# Patient Record
Sex: Male | Born: 1945
Health system: Southern US, Community
[De-identification: ages and names within clinical notes are randomized; demographics above are authoritative.]

## PROBLEM LIST (undated history)

## (undated) DIAGNOSIS — G4733 Obstructive sleep apnea (adult) (pediatric): Secondary | ICD-10-CM

## (undated) DIAGNOSIS — I10 Essential (primary) hypertension: Secondary | ICD-10-CM

## (undated) DIAGNOSIS — C801 Malignant (primary) neoplasm, unspecified: Secondary | ICD-10-CM

## (undated) DIAGNOSIS — F329 Major depressive disorder, single episode, unspecified: Secondary | ICD-10-CM

## (undated) DIAGNOSIS — M1990 Unspecified osteoarthritis, unspecified site: Secondary | ICD-10-CM

## (undated) DIAGNOSIS — K5790 Diverticulosis of intestine, part unspecified, without perforation or abscess without bleeding: Secondary | ICD-10-CM

## (undated) DIAGNOSIS — E785 Hyperlipidemia, unspecified: Secondary | ICD-10-CM

## (undated) DIAGNOSIS — Z87442 Personal history of urinary calculi: Secondary | ICD-10-CM

## (undated) DIAGNOSIS — G43909 Migraine, unspecified, not intractable, without status migrainosus: Secondary | ICD-10-CM

## (undated) DIAGNOSIS — F419 Anxiety disorder, unspecified: Secondary | ICD-10-CM

## (undated) DIAGNOSIS — I499 Cardiac arrhythmia, unspecified: Secondary | ICD-10-CM

## (undated) DIAGNOSIS — F32A Depression, unspecified: Secondary | ICD-10-CM

## (undated) DIAGNOSIS — R7303 Prediabetes: Secondary | ICD-10-CM

## (undated) HISTORY — DX: Diverticulosis of intestine, part unspecified, without perforation or abscess without bleeding: K57.90

## (undated) HISTORY — DX: Hyperlipidemia, unspecified: E78.5

## (undated) HISTORY — DX: Depression, unspecified: F32.A

## (undated) HISTORY — DX: Essential (primary) hypertension: I10

## (undated) HISTORY — PX: TONSILLECTOMY: SUR1361

## (undated) HISTORY — DX: Major depressive disorder, single episode, unspecified: F32.9

## (undated) HISTORY — DX: Migraine, unspecified, not intractable, without status migrainosus: G43.909

## (undated) HISTORY — DX: Obstructive sleep apnea (adult) (pediatric): G47.33

## (undated) HISTORY — PX: GANGLION CYST EXCISION: SHX1691

---

## 2004-02-25 ENCOUNTER — Ambulatory Visit: Payer: Self-pay | Admitting: Psychology

## 2004-03-06 ENCOUNTER — Ambulatory Visit: Payer: Self-pay | Admitting: Psychology

## 2004-03-24 ENCOUNTER — Ambulatory Visit: Payer: Self-pay | Admitting: Psychology

## 2004-04-03 ENCOUNTER — Ambulatory Visit: Payer: Self-pay | Admitting: Psychology

## 2004-04-24 ENCOUNTER — Ambulatory Visit: Payer: Self-pay | Admitting: Psychology

## 2004-05-15 ENCOUNTER — Ambulatory Visit: Payer: Self-pay | Admitting: Psychology

## 2004-06-09 ENCOUNTER — Encounter (INDEPENDENT_AMBULATORY_CARE_PROVIDER_SITE_OTHER): Payer: Self-pay | Admitting: Specialist

## 2004-06-09 ENCOUNTER — Ambulatory Visit (HOSPITAL_COMMUNITY): Admission: RE | Admit: 2004-06-09 | Discharge: 2004-06-09 | Payer: Self-pay | Admitting: *Deleted

## 2004-06-12 ENCOUNTER — Ambulatory Visit: Payer: Self-pay | Admitting: Psychology

## 2004-07-10 ENCOUNTER — Ambulatory Visit: Payer: Self-pay | Admitting: Psychology

## 2004-07-24 ENCOUNTER — Ambulatory Visit: Payer: Self-pay | Admitting: Psychology

## 2004-08-07 ENCOUNTER — Ambulatory Visit: Payer: Self-pay | Admitting: Psychology

## 2004-08-21 ENCOUNTER — Ambulatory Visit: Payer: Self-pay | Admitting: Psychology

## 2004-09-05 ENCOUNTER — Ambulatory Visit: Payer: Self-pay | Admitting: Psychology

## 2004-09-18 ENCOUNTER — Ambulatory Visit: Payer: Self-pay | Admitting: Psychology

## 2004-10-16 ENCOUNTER — Ambulatory Visit: Payer: Self-pay | Admitting: Psychology

## 2004-10-30 ENCOUNTER — Ambulatory Visit: Payer: Self-pay | Admitting: Psychology

## 2004-11-13 ENCOUNTER — Ambulatory Visit: Payer: Self-pay | Admitting: Psychology

## 2004-11-27 ENCOUNTER — Ambulatory Visit: Payer: Self-pay | Admitting: Psychology

## 2004-12-11 ENCOUNTER — Ambulatory Visit: Payer: Self-pay | Admitting: Psychology

## 2005-01-08 ENCOUNTER — Ambulatory Visit: Payer: Self-pay | Admitting: Psychology

## 2005-01-22 ENCOUNTER — Ambulatory Visit: Payer: Self-pay | Admitting: Psychology

## 2005-02-05 ENCOUNTER — Ambulatory Visit: Payer: Self-pay | Admitting: Psychology

## 2005-02-19 ENCOUNTER — Ambulatory Visit: Payer: Self-pay | Admitting: Psychology

## 2005-03-13 ENCOUNTER — Ambulatory Visit: Payer: Self-pay | Admitting: Psychology

## 2005-04-24 ENCOUNTER — Ambulatory Visit: Payer: Self-pay | Admitting: Psychology

## 2005-05-22 ENCOUNTER — Ambulatory Visit: Payer: Self-pay | Admitting: Psychology

## 2005-05-30 ENCOUNTER — Ambulatory Visit: Payer: Self-pay | Admitting: Psychology

## 2005-06-19 ENCOUNTER — Ambulatory Visit: Payer: Self-pay | Admitting: Psychology

## 2005-07-03 ENCOUNTER — Ambulatory Visit: Payer: Self-pay | Admitting: Psychology

## 2005-07-24 ENCOUNTER — Ambulatory Visit: Payer: Self-pay | Admitting: Psychology

## 2005-08-27 ENCOUNTER — Inpatient Hospital Stay (HOSPITAL_COMMUNITY): Admission: EM | Admit: 2005-08-27 | Discharge: 2005-08-27 | Payer: Self-pay

## 2005-08-27 ENCOUNTER — Encounter: Payer: Self-pay | Admitting: Emergency Medicine

## 2006-08-01 ENCOUNTER — Encounter: Admission: RE | Admit: 2006-08-01 | Discharge: 2006-08-01 | Payer: Self-pay | Admitting: Family Medicine

## 2006-08-12 ENCOUNTER — Encounter: Admission: RE | Admit: 2006-08-12 | Discharge: 2006-08-12 | Payer: Self-pay | Admitting: Family Medicine

## 2010-04-19 ENCOUNTER — Ambulatory Visit (INDEPENDENT_AMBULATORY_CARE_PROVIDER_SITE_OTHER): Payer: Medicare Other | Admitting: Psychology

## 2010-04-19 DIAGNOSIS — F4322 Adjustment disorder with anxiety: Secondary | ICD-10-CM

## 2010-05-19 NOTE — Discharge Summary (Signed)
Wesley, [name]             ACCOUNT NO.:  [id number]   MEDICAL RECORD NO.:  [id number]          PATIENT TYPE:  INP   LOCATION:  3035                         FACILITY:  [facility]   PHYSICIAN:  Cherylynn Ridges, M.D.    DATE OF BIRTH:  12-19-1945   DATE OF ADMISSION:  08/27/2005  DATE OF DISCHARGE:  08/27/2005                                 DISCHARGE SUMMARY   DISCHARGE DIAGNOSES:  1. Status post fall down a flight of stairs.  2. Closed head injury concussion with amnesia for event and positive loss      of consciousness.  3. Cervical strain.   BRIEF HISTORY ON ADMISSION:  This is a 65 year old male who accidentally  fell down a flight of stairs at home.  He was found confused by the family.  He was brought to the [facility] ER and was complaining of some  neck pain.  He underwent scanning including head, C-spine, chest, abdomen  and pelvis all of which were negative but he appeared intermittently  confused and perseverative.  It was felt to be observed overnight.  He was  brought to [facility] Service in order for him to continue to be  observed.  He did well overnight and was alert, oriented and appropriate by  the following day.  He was continuing to complain of some neck pain and had  some known degenerative changes and was being followed by Dr. Newell Coral in  the  past for this.  He underwent flexion/extension views which did not show any  evidence for ligamentous instability and his cervical spine CT initially  again was negative for any evidence for fracture.  The patient's C. collar  was discontinued at this time and the patient was mobilized and tolerating a  regular diet.  At this time, he is discharged home.   MEDICATIONS ON DISCHARGE:  1. Vicodin 5/500 mg one to two p.o. q.4-6h. p.r.n. pain #50 no refill.  2. Robaxin 5000 mg one to two p.o. q.6h. p.r.n. muscle spasm #60 no      refill.   The patient is to resume driving in 1 week, no lifting for 1 week.   He is to  increase activity slowly.  He will follow up with trauma service on as-  needed basis, but can continue to follow up with Dr. Newell Coral concerning his  chronic degenerative C-spine issues.      Shawn Rayburn, P.A.      Cherylynn Ridges, M.D.  Electronically Signed    SR/MEDQ  D:  08/27/2005  T:  08/28/2005  Job:  [id number]

## 2010-05-19 NOTE — H&P (Signed)
NAMECOLLIER, BOHNET             ACCOUNT NO.:  [id number]   MEDICAL RECORD NO.:  [id number]          PATIENT TYPE:  EMS   LOCATION:  ED                           FACILITY:  [facility]   PHYSICIAN:  Lorne Skeens. Hoxworth, M.D.DATE OF BIRTH:  1945-08-25   DATE OF ADMISSION:  08/27/2005  DATE OF DISCHARGE:                                HISTORY & PHYSICAL   CHIEF COMPLAINT:  Fall, neck pain and confusion.   HISTORY OF PRESENT ILLNESS:  The patient is a 65 year old white male whose  family states they heard him fall down a short flight of steps at home.  He  was found lying on the ground, somewhat dazed, but awake.  Since that time,  he has been confused, disoriented and repeating questions.  They brought him  to the [facility] Emergency Room for evaluation.  He has been alert with  stable vital signs, confused as above.  His only complaint is some neck  discomfort.   PAST MEDICAL HISTORY:   SURGERY:  None.   MEDICAL:  He is followed for hypertension, elevated cholesterol and history  depression.   MEDICATIONS:  Norvasc, Zetia, Prozac, aspirin, dosages unknown.   ALLERGIES:  PENICILLIN.   SOCIAL HISTORY:  Married.  Currently unemployed.  No cigarettes.  Occasional  alcohol.   FAMILY HISTORY:  Noncontributory.   REVIEW OF SYSTEMS:  All negative, except as above.   PHYSICAL EXAM:  VITAL SIGNS:  Temperature is 97.9, pulse 71, respirations  20, blood pressure 139/93.  GENERAL:  Well-developed, well-nourished white male who is alert, but  confused.  Repeating questions.  SKIN:  Warm and dry.  HEENT/NECK:  Cervical collar is in place.  On examining the neck with a  collar off, there is some posterior neck tenderness and the collar is  replaced.  Pupils are equal, round and reactive to light.  EOMs intact.  TMs  clear without blood.  Oropharynx clear.  No cranial swelling, tenderness or  scalp lacerations or other evidence of craniofacial trauma.  CHEST:  Nontender.  No crepitus.   Breath sounds clear and equal.  CARDIAC:  Regular rhythm.  No murmurs.  Peripheral pulses intact.  No edema.  ABDOMEN:  Soft and nontender.  No masses.  MUSCULOSKELETAL:  Pelvis stable and nontender.  EXTREMITIES:  Full active and passive range of motion without pain.  No  swelling or tenderness.  NEUROLOGIC:  He is alert and responsive, oriented to person only, not place,  date or situation, repeating questions.  Strength and sensation intact in  extremities x4.   LABORATORY DATA AND X-RAY:  CBC, electrolytes and urinalysis all negative.   CT scans of the head, C-spine, chest and abdomen and pelvis all negative for  acute injury.   ASSESSMENT AND PLAN:  Fall with apparent closed head injury, with negative  CT scan.  The patient remains confused.  He also has some neck tenderness  with a normal CT of the cervical spine, rule out ligamentous injury.  The  patient will be admitted to the trauma service for observation and frequent  neurological checks.  We will leave  in cervical collar for now for repeat  examination.      Lorne Skeens. Hoxworth, M.D.  Electronically Signed     BTH/MEDQ  D:  08/27/2005  T:  08/27/2005  Job:  [id number]

## 2012-02-06 ENCOUNTER — Encounter (INDEPENDENT_AMBULATORY_CARE_PROVIDER_SITE_OTHER): Payer: Self-pay | Admitting: Surgery

## 2012-02-12 ENCOUNTER — Ambulatory Visit (INDEPENDENT_AMBULATORY_CARE_PROVIDER_SITE_OTHER): Payer: Self-pay | Admitting: Surgery

## 2012-02-13 ENCOUNTER — Ambulatory Visit (INDEPENDENT_AMBULATORY_CARE_PROVIDER_SITE_OTHER): Payer: Medicare PPO | Admitting: Surgery

## 2012-02-13 ENCOUNTER — Encounter (INDEPENDENT_AMBULATORY_CARE_PROVIDER_SITE_OTHER): Payer: Self-pay | Admitting: Surgery

## 2012-02-13 VITALS — BP 132/72 | HR 68 | Temp 97.4°F | Resp 18 | Ht 71.0 in | Wt 192.4 lb

## 2012-02-13 DIAGNOSIS — K645 Perianal venous thrombosis: Secondary | ICD-10-CM

## 2012-02-13 NOTE — Progress Notes (Signed)
General Surgery [facility]  Chief Complaint  Patient presents with  . New Evaluation    eval hemorrhoids - referral from Dr. Shaune Pollack, [place] Sprang at [place]    HISTORY: The patient is a 67 year old white male referred by his primary care physician with issues with hemorrhoids. He has had intermittent problems over the years. He has had a previous incision and evacuation of an external hemorrhoid by his primary care physician. Over the past 2-3 weeks he has noted pain and swelling itching and burning. He denies any bleeding. He presents today for evaluation.  Past Medical History  Diagnosis Date  . Depression   . Hyperlipidemia   . Hypertension   . Diverticulosis   . Migraine      Current Outpatient Prescriptions  Medication Sig Dispense Refill  . amLODipine (NORVASC) 10 MG tablet Take 10 mg by mouth daily.      Marland Kitchen aspirin 81 MG tablet Take 81 mg by mouth daily.      Marland Kitchen atorvastatin (LIPITOR) 10 MG tablet Take 10 mg by mouth daily.      Marland Kitchen buPROPion (WELLBUTRIN SR) 150 MG 12 hr tablet Take 150 mg by mouth 2 (two) times daily.      . fexofenadine (ALLEGRA) 180 MG tablet Take 180 mg by mouth daily.      . fluticasone (FLONASE) 50 MCG/ACT nasal spray Place 2 sprays into the nose daily.      . hydrochlorothiazide (MICROZIDE) 12.5 MG capsule Take 12.5 mg by mouth daily.      Marland Kitchen lidocaine-hydrocortisone (ANAMANTLE HC) 3-0.5 % CREA Place 1 Applicatorful rectally.      . valACYclovir (VALTREX) 1000 MG tablet Take 1,000 mg by mouth 2 (two) times daily.       No current facility-administered medications for this visit.     Allergies  Allergen Reactions  . Iohexol      Code: HIVES, Desc: PT DEVELOPED HIVES DURING IV ADMINISTRATION/   . Ivp Dye (Iodinated Diagnostic Agents) Hives  . Penicillins   . Percocet (Oxycodone-Acetaminophen) Itching  . Zetia (Ezetimibe) Nausea And Vomiting     Family History  Problem Relation Age of Onset  . Heart disease Mother   .  Pneumonia Father      History   Social History  . Marital Status: Married    Spouse Name: N/A    Number of Children: N/A  . Years of Education: N/A   Social History Main Topics  . Smoking status: Never Smoker   . Smokeless tobacco: Former Neurosurgeon    Types: Chew  . Alcohol Use: 0.6 oz/week    1 Glasses of wine per week     Comment: Daily.  . Drug Use: No  . Sexually Active: None   Other Topics Concern  . None   Social History Narrative  . None     REVIEW OF SYSTEMS - PERTINENT POSITIVES ONLY: No bleeding. Pain and swelling at anal verge. Burning and itching.  EXAM: Filed Vitals:   02/13/12 0859  BP: 132/72  Pulse: 68  Temp: 97.4 F (36.3 C)  Resp: 18    HEENT: normocephalic; pupils equal and reactive; sclerae clear; dentition good; mucous membranes moist NECK:  symmetric on extension; no palpable anterior or posterior cervical lymphadenopathy; no supraclavicular masses; no tenderness CHEST: clear to auscultation bilaterally without rales, rhonchi, or wheezes CARDIAC: regular rate and rhythm without significant murmur; peripheral pulses are full GU:   External examination shows a thrombosed external hemorrhoid in  the left mid anus. No ulceration. No prolapse. No sign of fistula or fissure. Moderate tenderness. EXT:  non-tender without edema; no deformity NEURO: no gross focal deficits; no sign of tremor  PROCEDURE: Under aseptic conditions, local anesthetic is injected. A 1 cm elliptical incision is made. A large volume of thrombus and ectatic vein is extracted. Hemostasis is achieved with direct pressure.  LABORATORY RESULTS: See [facility] ([facility]L-Epic) for most recent results   RADIOLOGY RESULTS: See [facility] (CHL-Epic) for most recent results   IMPRESSION: Thrombosed external hemorrhoid  PLAN: Usual instructions for local wound care given. Patient will return as needed.  Velora Heckler, MD, FACS General & Endocrine Surgery [facility]   Visit Diagnoses: 1. Hemorrhoids, external, thrombosed     Primary Care Physician: Hollice Espy, MD

## 2012-02-13 NOTE — Patient Instructions (Signed)
ANORECTAL PROCEDURES: 1.  Tub soaks 2-3 times daily in warm water (may add Epsom salts if desired) 2.  Stool softener for one month (store brand Miralax or Colace) 3.  Avoid toilet paper - use baby wipes or Tucks pads 4.  Increase water intake - 6-8 glasses daily 5.  Apply dry pad to area until drainage stops 

## 2013-12-23 ENCOUNTER — Ambulatory Visit
Admission: RE | Admit: 2013-12-23 | Discharge: 2013-12-23 | Disposition: A | Discharge status: Home / Self Care | Payer: Medicare PPO | Source: Ambulatory Visit | Attending: Family Medicine | Admitting: Family Medicine

## 2013-12-23 ENCOUNTER — Other Ambulatory Visit: Payer: Self-pay | Admitting: Family Medicine

## 2013-12-23 DIAGNOSIS — M545 Low back pain, unspecified: Secondary | ICD-10-CM

## 2014-01-05 ENCOUNTER — Ambulatory Visit: Payer: Medicare PPO | Attending: Family Medicine | Admitting: Physical Therapy

## 2014-01-05 DIAGNOSIS — S39002D Unspecified injury of muscle, fascia and tendon of lower back, subsequent encounter: Secondary | ICD-10-CM | POA: Insufficient documentation

## 2014-01-05 DIAGNOSIS — M545 Low back pain: Secondary | ICD-10-CM | POA: Insufficient documentation

## 2014-01-05 DIAGNOSIS — X58XXXD Exposure to other specified factors, subsequent encounter: Secondary | ICD-10-CM | POA: Insufficient documentation

## 2014-01-08 ENCOUNTER — Ambulatory Visit: Payer: Medicare PPO | Admitting: Physical Therapy

## 2014-01-08 DIAGNOSIS — S39002D Unspecified injury of muscle, fascia and tendon of lower back, subsequent encounter: Secondary | ICD-10-CM | POA: Diagnosis not present

## 2014-01-12 ENCOUNTER — Ambulatory Visit: Payer: Medicare PPO | Admitting: Physical Therapy

## 2014-01-12 DIAGNOSIS — S39002D Unspecified injury of muscle, fascia and tendon of lower back, subsequent encounter: Secondary | ICD-10-CM | POA: Diagnosis not present

## 2014-01-15 ENCOUNTER — Ambulatory Visit: Payer: Medicare PPO | Admitting: Physical Therapy

## 2014-01-15 DIAGNOSIS — S39002D Unspecified injury of muscle, fascia and tendon of lower back, subsequent encounter: Secondary | ICD-10-CM | POA: Diagnosis not present

## 2014-01-19 ENCOUNTER — Encounter: Payer: Medicare PPO | Admitting: Physical Therapy

## 2014-01-22 ENCOUNTER — Ambulatory Visit: Payer: Medicare PPO | Admitting: Physical Therapy

## 2014-01-26 ENCOUNTER — Ambulatory Visit: Payer: Medicare PPO | Admitting: Physical Therapy

## 2014-01-29 ENCOUNTER — Encounter: Payer: Medicare PPO | Admitting: Physical Therapy

## 2014-02-01 ENCOUNTER — Ambulatory Visit: Payer: Medicare PPO | Attending: Family Medicine | Admitting: Physical Therapy

## 2014-02-01 DIAGNOSIS — M545 Low back pain: Secondary | ICD-10-CM | POA: Insufficient documentation

## 2014-02-01 DIAGNOSIS — S39002D Unspecified injury of muscle, fascia and tendon of lower back, subsequent encounter: Secondary | ICD-10-CM | POA: Diagnosis not present

## 2014-02-01 DIAGNOSIS — X58XXXD Exposure to other specified factors, subsequent encounter: Secondary | ICD-10-CM | POA: Insufficient documentation

## 2014-02-02 ENCOUNTER — Encounter: Payer: Medicare PPO | Admitting: Physical Therapy

## 2014-02-05 ENCOUNTER — Ambulatory Visit: Payer: Self-pay | Admitting: Physical Therapy

## 2014-02-08 ENCOUNTER — Ambulatory Visit: Payer: Medicare PPO | Admitting: Physical Therapy

## 2014-06-28 DIAGNOSIS — F330 Major depressive disorder, recurrent, mild: Secondary | ICD-10-CM | POA: Diagnosis not present

## 2014-06-28 DIAGNOSIS — I10 Essential (primary) hypertension: Secondary | ICD-10-CM | POA: Diagnosis not present

## 2014-06-28 DIAGNOSIS — E780 Pure hypercholesterolemia: Secondary | ICD-10-CM | POA: Diagnosis not present

## 2014-08-14 DIAGNOSIS — S61002A Unspecified open wound of left thumb without damage to nail, initial encounter: Secondary | ICD-10-CM | POA: Diagnosis not present

## 2014-08-14 DIAGNOSIS — S61207A Unspecified open wound of left little finger without damage to nail, initial encounter: Secondary | ICD-10-CM | POA: Diagnosis not present

## 2014-11-24 DIAGNOSIS — R350 Frequency of micturition: Secondary | ICD-10-CM | POA: Diagnosis not present

## 2014-12-03 DIAGNOSIS — R3915 Urgency of urination: Secondary | ICD-10-CM | POA: Diagnosis not present

## 2014-12-03 DIAGNOSIS — N401 Enlarged prostate with lower urinary tract symptoms: Secondary | ICD-10-CM | POA: Diagnosis not present

## 2014-12-03 DIAGNOSIS — R972 Elevated prostate specific antigen [PSA]: Secondary | ICD-10-CM | POA: Diagnosis not present

## 2014-12-06 DIAGNOSIS — L570 Actinic keratosis: Secondary | ICD-10-CM | POA: Diagnosis not present

## 2014-12-06 DIAGNOSIS — L820 Inflamed seborrheic keratosis: Secondary | ICD-10-CM | POA: Diagnosis not present

## 2014-12-17 DIAGNOSIS — R972 Elevated prostate specific antigen [PSA]: Secondary | ICD-10-CM | POA: Diagnosis not present

## 2014-12-22 ENCOUNTER — Encounter (HOSPITAL_COMMUNITY): Payer: Self-pay | Admitting: Emergency Medicine

## 2014-12-22 ENCOUNTER — Emergency Department (HOSPITAL_COMMUNITY)
Admission: EM | Admit: 2014-12-22 | Discharge: 2014-12-22 | Disposition: A | Discharge status: Home / Self Care | Payer: Medicare PPO | Attending: Emergency Medicine | Admitting: Emergency Medicine

## 2014-12-22 DIAGNOSIS — Z79899 Other long term (current) drug therapy: Secondary | ICD-10-CM | POA: Insufficient documentation

## 2014-12-22 DIAGNOSIS — Z880 Allergy status to penicillin: Secondary | ICD-10-CM | POA: Insufficient documentation

## 2014-12-22 DIAGNOSIS — R1032 Left lower quadrant pain: Secondary | ICD-10-CM | POA: Diagnosis not present

## 2014-12-22 DIAGNOSIS — E785 Hyperlipidemia, unspecified: Secondary | ICD-10-CM | POA: Diagnosis not present

## 2014-12-22 DIAGNOSIS — R110 Nausea: Secondary | ICD-10-CM | POA: Diagnosis not present

## 2014-12-22 DIAGNOSIS — G43909 Migraine, unspecified, not intractable, without status migrainosus: Secondary | ICD-10-CM | POA: Diagnosis not present

## 2014-12-22 DIAGNOSIS — F329 Major depressive disorder, single episode, unspecified: Secondary | ICD-10-CM | POA: Diagnosis not present

## 2014-12-22 DIAGNOSIS — Z7982 Long term (current) use of aspirin: Secondary | ICD-10-CM | POA: Insufficient documentation

## 2014-12-22 DIAGNOSIS — I10 Essential (primary) hypertension: Secondary | ICD-10-CM | POA: Insufficient documentation

## 2014-12-22 LAB — LIPASE, BLOOD: Lipase: 28 U/L (ref 11–51)

## 2014-12-22 LAB — COMPREHENSIVE METABOLIC PANEL
ALBUMIN: 3.9 g/dL (ref 3.5–5.0)
ALK PHOS: 93 U/L (ref 38–126)
ALT: 29 U/L (ref 17–63)
AST: 28 U/L (ref 15–41)
Anion gap: 12 (ref 5–15)
BILIRUBIN TOTAL: 1 mg/dL (ref 0.3–1.2)
BUN: 10 mg/dL (ref 6–20)
CO2: 23 mmol/L (ref 22–32)
CREATININE: 1.07 mg/dL (ref 0.61–1.24)
Calcium: 9.1 mg/dL (ref 8.9–10.3)
Chloride: 103 mmol/L (ref 101–111)
GFR calc Af Amer: >60 mL/min (ref >60)
GFR calc non Af Amer: >60 mL/min (ref >60)
Glucose, Bld: 168 mg/dL — ABNORMAL HIGH (ref 65–99)
Potassium: 3.1 mmol/L — ABNORMAL LOW (ref 3.5–5.1)
Sodium: 138 mmol/L (ref 135–145)
TOTAL PROTEIN: 7 g/dL (ref 6.5–8.1)

## 2014-12-22 LAB — URINALYSIS, ROUTINE W REFLEX MICROSCOPIC
Bilirubin Urine: NEGATIVE
Glucose, UA: 100 mg/dL — AB
Hgb urine dipstick: NEGATIVE
KETONES UR: NEGATIVE mg/dL
LEUKOCYTES UA: NEGATIVE
NITRITE: NEGATIVE
PROTEIN: NEGATIVE mg/dL
Specific Gravity, Urine: 1.019 (ref 1.005–1.030)
pH: 6.5 (ref 5.0–8.0)

## 2014-12-22 LAB — CBC
HCT: 46.9 % (ref 39.0–52.0)
Hemoglobin: 16 g/dL (ref 13.0–17.0)
MCH: 30.8 pg (ref 26.0–34.0)
MCHC: 34.1 g/dL (ref 30.0–36.0)
MCV: 90.4 fL (ref 78.0–100.0)
PLATELETS: 217 10*3/uL (ref 150–400)
RBC: 5.19 MIL/uL (ref 4.22–5.81)
RDW: 13.8 % (ref 11.5–15.5)
WBC: 10.3 10*3/uL (ref 4.0–10.5)

## 2014-12-22 MED ORDER — CIPROFLOXACIN HCL 500 MG PO TABS: 500.0000 mg | ORAL_TABLET | Freq: Two times a day (BID) | ORAL | Status: DC

## 2014-12-22 MED ORDER — HYDROCODONE-ACETAMINOPHEN 5-325 MG PO TABS: 1.0000 | ORAL_TABLET | ORAL | Status: DC | PRN

## 2014-12-22 MED ORDER — CIPROFLOXACIN IN D5W 400 MG/200ML IV SOLN
400.0000 mg | Freq: Once | INTRAVENOUS | Status: AC
  Administered 2014-12-22: 400 mg via INTRAVENOUS
  Filled 2014-12-22: qty 200

## 2014-12-22 MED ORDER — METRONIDAZOLE IN NACL 5-0.79 MG/ML-% IV SOLN
500.0000 mg | Freq: Once | INTRAVENOUS | Status: AC
  Administered 2014-12-22: 500 mg via INTRAVENOUS
  Filled 2014-12-22: qty 100

## 2014-12-22 MED ORDER — FENTANYL CITRATE (PF) 100 MCG/2ML IJ SOLN
100.0000 ug | Freq: Once | INTRAMUSCULAR | Status: AC
  Administered 2014-12-22: 100 ug via INTRAVENOUS
  Filled 2014-12-22: qty 2

## 2014-12-22 MED ORDER — METRONIDAZOLE 500 MG PO TABS: 500.0000 mg | ORAL_TABLET | Freq: Two times a day (BID) | ORAL | Status: DC

## 2014-12-22 MED ORDER — ONDANSETRON HCL 4 MG/2ML IJ SOLN
4.0000 mg | Freq: Once | INTRAMUSCULAR | Status: AC
  Administered 2014-12-22: 4 mg via INTRAVENOUS
  Filled 2014-12-22: qty 2

## 2014-12-22 NOTE — ED Provider Notes (Signed)
CSN: [id number]     Arrival date & time 12/22/14  0402 History   First MD Initiated Contact with Patient 12/22/14 [phone]     Chief Complaint  Patient presents with  . Abdominal Pain     Patient is a 69 y.o. male presenting with abdominal pain. The history is provided by the spouse and the patient.  Abdominal Pain Pain location:  LLQ Pain quality: aching   Pain radiates to:  Does not radiate Pain severity:  Moderate Onset quality:  Gradual Duration:  3 hours Timing:  Constant Progression:  Unchanged Chronicity:  New Relieved by: rest. Worsened by:  Movement and palpation Associated symptoms: chills and nausea   Associated symptoms: no chest pain, no diarrhea, no dysuria, no fever, no hematochezia, no melena, no shortness of breath and no vomiting   Risk factors: has not had multiple surgeries   Patient reports onset of LLQ pain while sleeping He reports chills and mild nausea No fever is reported He has had this before and has been treated for presumed diverticulitis and started on antibiotics and improved No h/o abd surgery He reports he has had colonoscopy recently and was told he had diverticulosis   Past Medical History  Diagnosis Date  . Depression   . Hyperlipidemia   . Hypertension   . Diverticulosis   . Migraine    Past Surgical History  Procedure Laterality Date  . Ganglion cyst excision     Family History  Problem Relation Age of Onset  . Heart disease Mother   . Pneumonia Father    Social History  Substance Use Topics  . Smoking status: Never Smoker   . Smokeless tobacco: Former Systems developer    Types: Chew  . Alcohol Use: 0.6 oz/week    1 Glasses of wine per week     Comment: Daily.    Review of Systems  Constitutional: Positive for chills. Negative for fever.  Respiratory: Negative for shortness of breath.   Cardiovascular: Negative for chest pain.  Gastrointestinal: Positive for nausea and abdominal pain. Negative for vomiting, diarrhea, melena and  hematochezia.  Genitourinary: Negative for dysuria and difficulty urinating.  Musculoskeletal: Negative for back pain.  Neurological: Negative for weakness.  All other systems reviewed and are negative.     Allergies  Iohexol; Ivp dye; Penicillins; Percocet; and Zetia  Home Medications   Prior to Admission medications   Medication Sig Start Date End Date Taking? Authorizing Provider  amLODipine (NORVASC) 10 MG tablet Take 10 mg by mouth daily.   Yes Historical Provider, MD  aspirin 81 MG tablet Take 81 mg by mouth daily.   Yes Historical Provider, MD  atorvastatin (LIPITOR) 10 MG tablet Take 10 mg by mouth daily.   Yes Historical Provider, MD  buPROPion (WELLBUTRIN XL) 150 MG 24 hr tablet Take 150 mg by mouth daily.   Yes Historical Provider, MD  cyclobenzaprine (FLEXERIL) 10 MG tablet Take 10 mg by mouth 3 (three) times daily as needed for muscle spasms.   Yes Historical Provider, MD  fexofenadine (ALLEGRA) 180 MG tablet Take 180 mg by mouth daily as needed for allergies.    Yes Historical Provider, MD  fluticasone (FLONASE) 50 MCG/ACT nasal spray Place 2 sprays into the nose daily as needed for allergies.    Yes Historical Provider, MD  hydrochlorothiazide (MICROZIDE) 12.5 MG capsule Take 12.5 mg by mouth daily.   Yes Historical Provider, MD  SUMAtriptan (IMITREX) 6 MG/0.5ML SOLN injection Inject 6 mg into the skin every  2 (two) hours as needed for migraine or headache. May repeat in 2 hours if headache persists or recurs.   Yes Historical Provider, MD  tamsulosin (FLOMAX) 0.4 MG CAPS capsule Take 0.4 mg by mouth daily after breakfast.   Yes Historical Provider, MD  valACYclovir (VALTREX) 1000 MG tablet Take 1,000 mg by mouth every 12 (twelve) hours as needed (for flare up).    Yes Historical Provider, MD   BP 140/74 mmHg  Pulse 73  Temp(Src) 98.8 F (37.1 C) (Oral)  Resp 17  Ht 5\' 11"  (1.803 m)  Wt 83.915 kg  BMI 25.81 kg/m2  SpO2 90% Physical Exam CONSTITUTIONAL: Well  developed/well nourished HEAD: Normocephalic/atraumatic EYES: EOMI/PERRL ENMT: Mucous membranes moist NECK: supple no meningeal signs SPINE/BACK:entire spine nontender CV: S1/S2 noted, no murmurs/rubs/gallops noted LUNGS: Lungs are clear to auscultation bilaterally, no apparent distress ABDOMEN: soft, moderate LLQ tenderness but no rebound or guarding, bowel sounds noted GU:no cva tenderness NEURO: Pt is awake/alert/appropriate, moves all extremitiesx4.  No facial droop.   EXTREMITIES: pulses normal/equal, full ROM SKIN: warm, color normal PSYCH: no abnormalities of mood noted, alert and oriented to situation  ED Course  Procedures  Medications  ciprofloxacin (CIPRO) IVPB 400 mg (400 mg Intravenous New Bag/Given 12/22/14 0531)  metroNIDAZOLE (FLAGYL) IVPB 500 mg (500 mg Intravenous New Bag/Given 12/22/14 0531)  ondansetron (ZOFRAN) injection 4 mg (4 mg Intravenous Given 12/22/14 0512)  fentaNYL (SUBLIMAZE) injection 100 mcg (100 mcg Intravenous Given 12/22/14 0512)    Labs Review Labs Reviewed  COMPREHENSIVE METABOLIC PANEL - Abnormal; Notable for the following:    Potassium 3.1 (*)    Glucose, Bld 168 (*)    All other components within normal limits  URINALYSIS, ROUTINE W REFLEX MICROSCOPIC (NOT AT [facility]) - Abnormal; Notable for the following:    APPearance CLOUDY (*)    Glucose, UA 100 (*)    All other components within normal limits  LIPASE, BLOOD  CBC   I have personally reviewed and evaluated these  lab results as part of my medical decision-making.  6:13 AM Pt reports very similar to prior episodes Previous CT imaging has shows presence of diverticulitis and has had colonoscopy before that showed diverticulosis He would like to defer CT imaging and start antibiotics He is currently nearly pain free 7:06 AM Pt improved He ambulated without issues and no pain He reports pain nearly resolved No vomiting He would like to go home Pt is a good historian and feels this  is very similar to prior episodes After discussion, we agree to defer CT imaging, start cipro/flagyll and PCP f/u We discussed strict ER return precautions BP 141/82 mmHg  Pulse 67  Temp(Src) 98.3 F (36.8 C) (Oral)  Resp 17  Ht 5\' 11"  (1.803 m)  Wt 83.915 kg  BMI 25.81 kg/m2  SpO2 96% I doubt AAA or other acute abdominal emergency at this time   MDM   Final diagnoses:  Left lower quadrant pain    Nursing notes including past medical history and social history reviewed and considered in documentation Labs/vital reviewed myself and considered during evaluation Previous records reviewed and considered     Ripley Fraise, MD 12/22/14 [phone]

## 2014-12-22 NOTE — ED Notes (Signed)
Pt states lower left quadrant abdominal pain. Pt states painful to touch and with movement. Pt states past hx of diverticulitis. Pt AO x 4, denies nausea, vomiting and diarrhea.

## 2014-12-22 NOTE — ED Notes (Signed)
assit walking pt.he walked very well.

## 2014-12-22 NOTE — ED Notes (Signed)
Dr. Wickline at the bedside.  

## 2016-05-12 IMAGING — CR DG LUMBAR SPINE COMPLETE 4+V
5 series · 5 of 5 positions shown · non-contrast
Comparison: None.

CLINICAL DATA: Low back pain.

EXAM:
LUMBAR SPINE - COMPLETE 4+ VIEW

[view not recorded (1 of 5)]
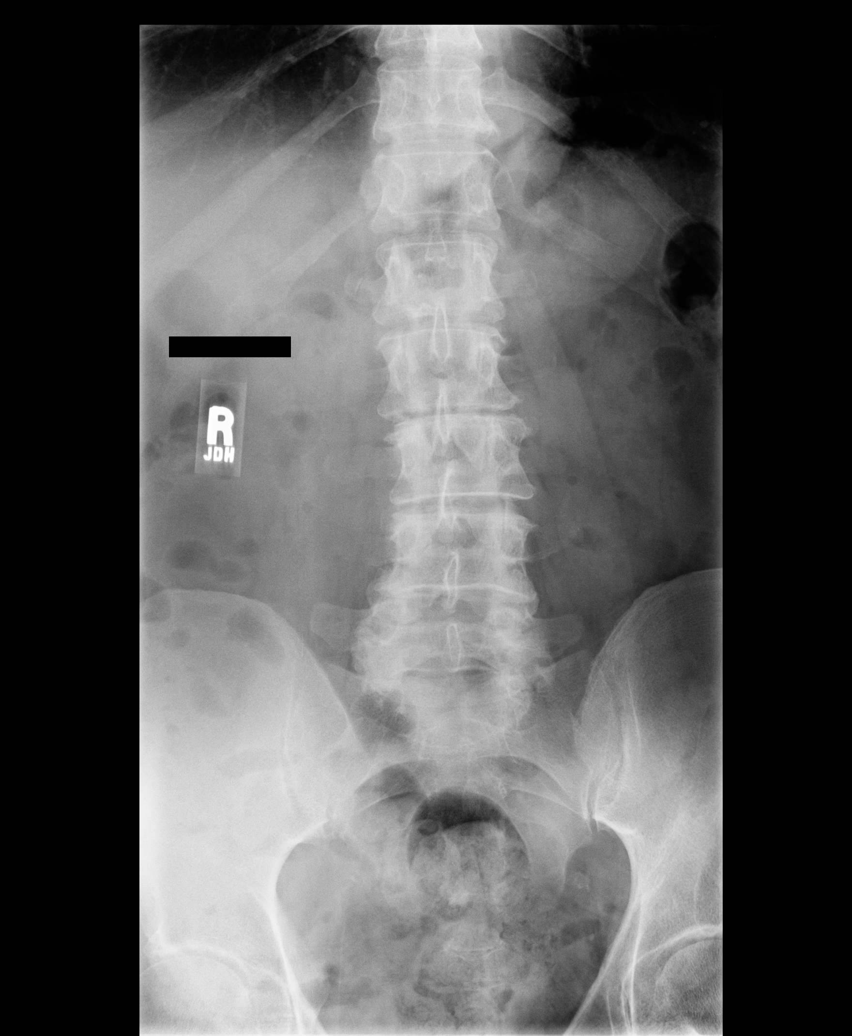

[view not recorded (2 of 5)]
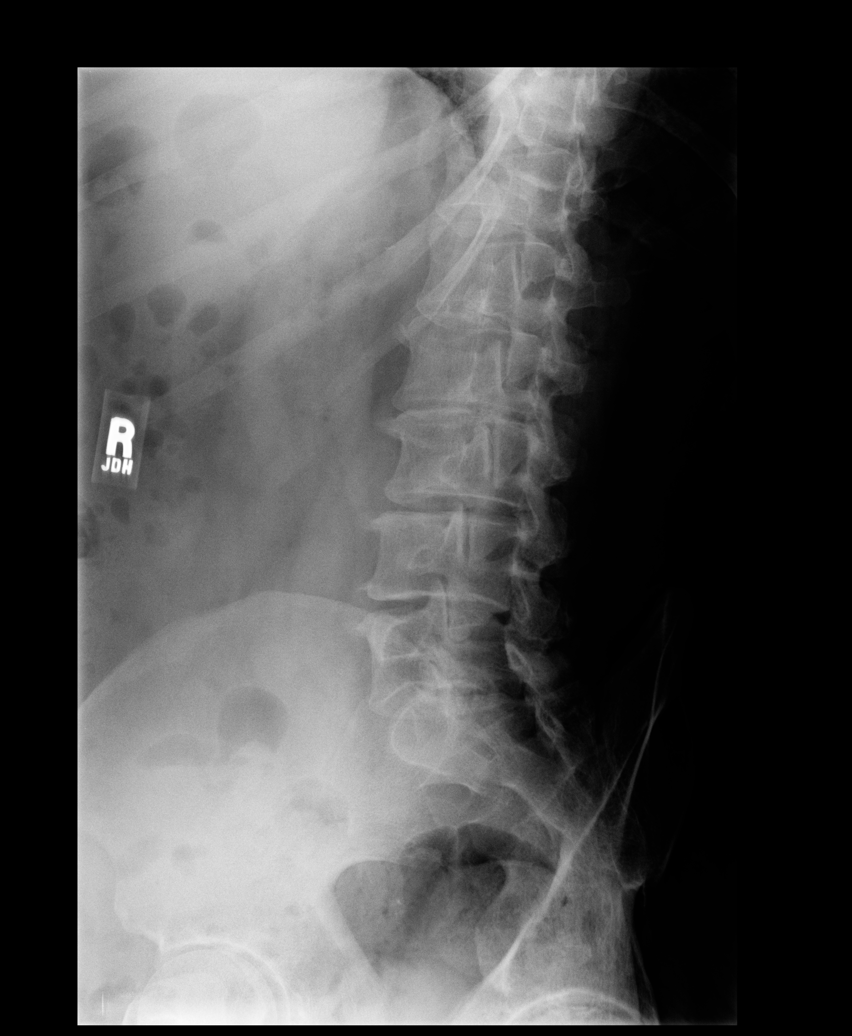

[view not recorded (3 of 5)]
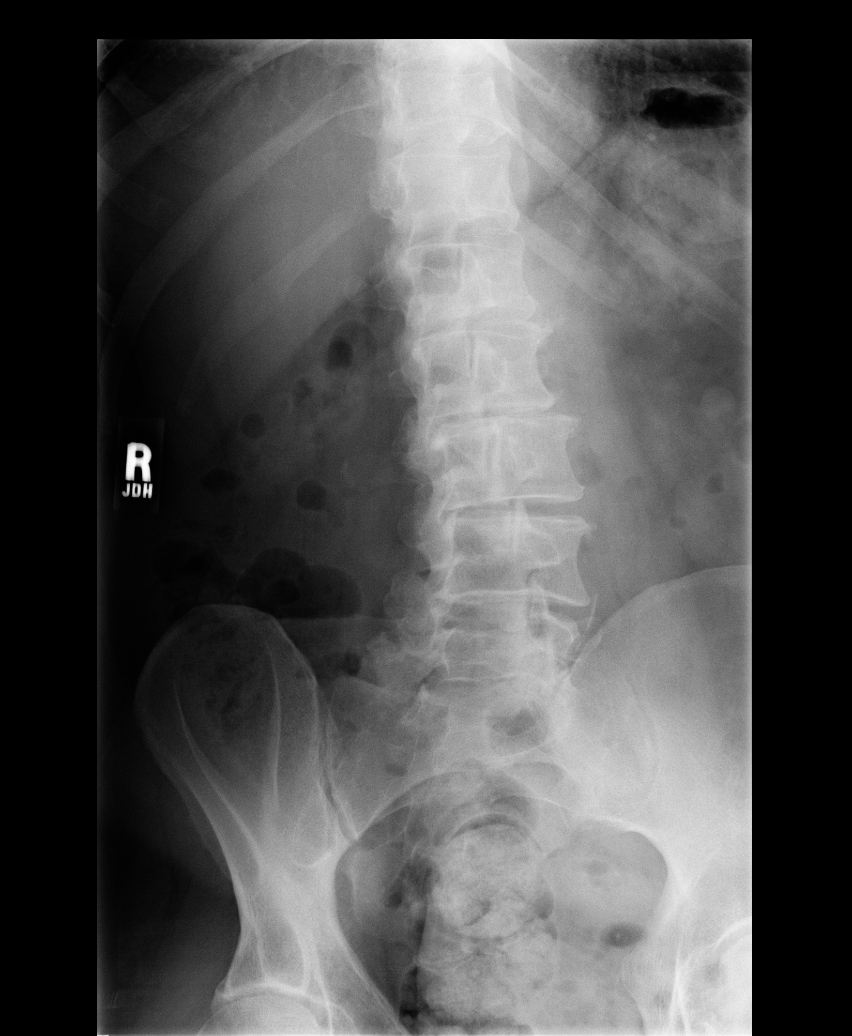

[view not recorded (4 of 5)]
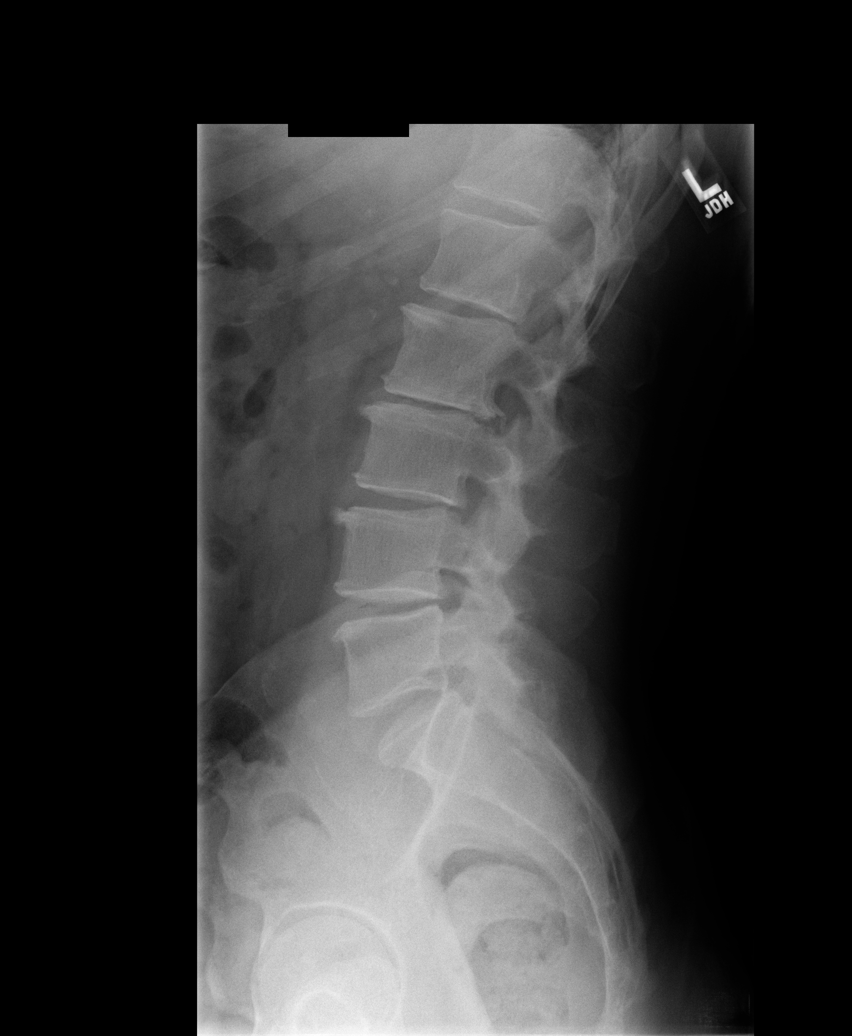

[view not recorded (5 of 5)]
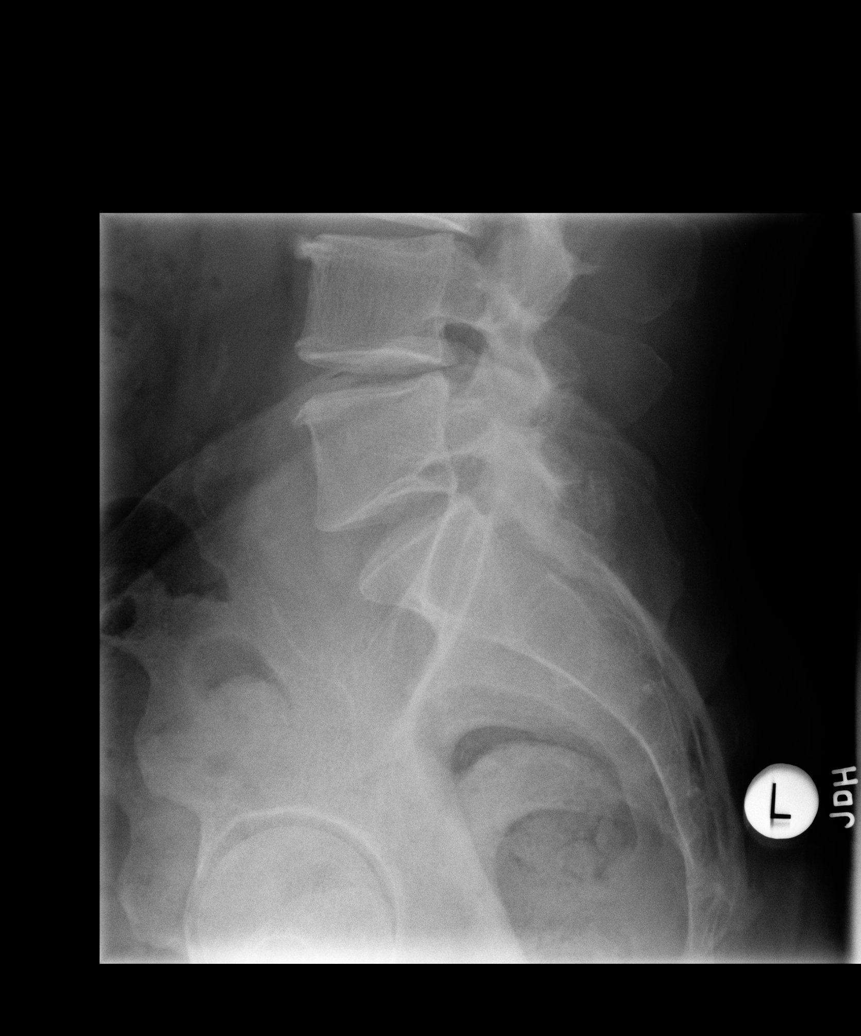

[5 of 5 positions shown; findings below may reference images not displayed]

FINDINGS: Diffuse degenerative change. Multilevel disc degeneration with
endplate osteophyte formation. No evidence of fracture or
dislocation. Normal alignment.
IMPRESSION: Diffuse degenerative change.  No acute abnormality.

## 2017-05-07 DIAGNOSIS — K219 Gastro-esophageal reflux disease without esophagitis: Secondary | ICD-10-CM | POA: Insufficient documentation

## 2017-05-07 DIAGNOSIS — F458 Other somatoform disorders: Secondary | ICD-10-CM | POA: Insufficient documentation

## 2017-05-07 DIAGNOSIS — H9113 Presbycusis, bilateral: Secondary | ICD-10-CM | POA: Insufficient documentation

## 2018-09-17 ENCOUNTER — Ambulatory Visit (INDEPENDENT_AMBULATORY_CARE_PROVIDER_SITE_OTHER): Payer: Medicare Other

## 2018-09-17 ENCOUNTER — Other Ambulatory Visit: Payer: Self-pay

## 2018-09-17 ENCOUNTER — Other Ambulatory Visit: Payer: Self-pay | Admitting: Podiatry

## 2018-09-17 ENCOUNTER — Encounter: Payer: Self-pay | Admitting: Podiatry

## 2018-09-17 ENCOUNTER — Ambulatory Visit: Payer: Medicare Other | Admitting: Podiatry

## 2018-09-17 DIAGNOSIS — M722 Plantar fascial fibromatosis: Secondary | ICD-10-CM | POA: Diagnosis not present

## 2018-09-17 DIAGNOSIS — M79672 Pain in left foot: Secondary | ICD-10-CM

## 2018-09-17 NOTE — Patient Instructions (Signed)

## 2018-09-17 NOTE — Progress Notes (Signed)
   Subjective:    Patient ID: Wesley [name], male    DOB: 03-26-1945, 73 y.o.   MRN: [id number]  HPI    Review of Systems  All other systems reviewed and are negative.      Objective:   Physical Exam        Assessment & Plan:

## 2018-09-23 NOTE — Progress Notes (Signed)
Subjective:   Patient ID: Wesley [name], male   DOB: 73 y.o.   MRN: [id number]   HPI Patient presents with painful left heel that at times a 10 for 10 and is becoming more bothersome.  States is been hurting for at least 2 months but probably longer and patient does not smoke likes to be active   Review of Systems  All other systems reviewed and are negative.       Objective:  Physical Exam Vitals signs and nursing note reviewed.  Constitutional:      Appearance: He is well-developed.  Pulmonary:     Effort: Pulmonary effort is normal.  Musculoskeletal: Normal range of motion.  Skin:    General: Skin is warm.  Neurological:     Mental Status: He is alert.     Neurovascular status intact muscle strength found to be adequate range of motion within normal limits with exquisite discomfort plantar aspect left heel at the insertional point tendon into the calcaneus with inflammation fluid around the medial band.  Patient is found to have good digital perfusion well oriented x3 with moderate depression of the arch and equinus at     Assessment:  2 plantar fasciitis left with inflammation fluid around the medial band     Plan:  H&P reviewed condition and did sterile prep and injected the fascia left 3 mg Kenalog 5 g Xylocaine applied fascial brace gave instructions on physical therapy shoe gear modifications  X-rays indicate spur formation no indication stress fracture arthritis

## 2018-10-01 ENCOUNTER — Other Ambulatory Visit: Payer: Self-pay

## 2018-10-01 ENCOUNTER — Encounter: Payer: Self-pay | Admitting: Podiatry

## 2018-10-01 ENCOUNTER — Ambulatory Visit (INDEPENDENT_AMBULATORY_CARE_PROVIDER_SITE_OTHER): Payer: Medicare Other | Admitting: Podiatry

## 2018-10-01 DIAGNOSIS — M722 Plantar fascial fibromatosis: Secondary | ICD-10-CM

## 2018-10-01 NOTE — Patient Instructions (Signed)

## 2018-10-01 NOTE — Progress Notes (Signed)
Subjective:   Patient ID: Wesley [name], male   DOB: 73 y.o.   MRN: [id number]   HPI Patient states my heel feels a little bit better but is still very sore and it is worse when I get up in the morning and after periods of sitting   ROS      Objective:  Physical Exam  Neurovascular status intact with exquisite acute inflammation still noted plantar aspect left heel at the insertional point of the tendon into the calcaneus     Assessment:  Acute fasciitis left still present worse after periods of sitting and sleeping     Plan:  I recommended aggressive stretching and I did dispense a night splint with instructions on usage along with ice therapy.  I did sterile prep and injected the fascia 3 mg Kenalog 5 mg Xylocaine and applied sterile dressing

## 2018-10-29 ENCOUNTER — Ambulatory Visit: Payer: Medicare Other | Admitting: Podiatry

## 2018-10-30 ENCOUNTER — Encounter: Payer: Self-pay | Admitting: Podiatry

## 2018-10-30 ENCOUNTER — Ambulatory Visit (INDEPENDENT_AMBULATORY_CARE_PROVIDER_SITE_OTHER): Payer: Medicare Other | Admitting: Podiatry

## 2018-10-30 ENCOUNTER — Other Ambulatory Visit: Payer: Self-pay

## 2018-10-30 DIAGNOSIS — M722 Plantar fascial fibromatosis: Secondary | ICD-10-CM | POA: Diagnosis not present

## 2018-10-31 NOTE — Progress Notes (Signed)
Subjective:   Patient ID: Wesley [name], male   DOB: 73 y.o.   MRN: [id number]   HPI Patient presents stating he still having a lot of pain in the left heel and states he had relief for just a short period of time followed by reoccurrence and it is worse when he walks and tries to weight-bear   ROS      Objective:  Physical Exam  Acute plantar fasciitis status intact with patient found to have continued exquisite discomfort plantar aspect left heel     Assessment:  Acute plantar fasciitis left which so far does not respond to conservative treatment     Plan:  H&P reviewed condition and at this point we will get a try immobilization with a walking boot.  I also did do sterile prep and reinjected the fascia 3 mg Kenalog 5 g Xylocaine and advised him on gradual reduction of the boot after 2 to 3 weeks if symptoms are improving.  Reappoint 4 weeks or earlier if needed

## 2018-11-21 ENCOUNTER — Other Ambulatory Visit: Payer: Self-pay

## 2018-11-21 DIAGNOSIS — Z20822 Contact with and (suspected) exposure to covid-19: Secondary | ICD-10-CM

## 2018-11-24 LAB — NOVEL CORONAVIRUS, NAA: SARS-CoV-2, NAA: NOT DETECTED

## 2018-12-03 ENCOUNTER — Ambulatory Visit: Payer: Medicare Other | Admitting: Podiatry

## 2018-12-03 ENCOUNTER — Other Ambulatory Visit: Payer: Self-pay

## 2018-12-03 ENCOUNTER — Encounter: Payer: Self-pay | Admitting: Podiatry

## 2018-12-03 DIAGNOSIS — M722 Plantar fascial fibromatosis: Secondary | ICD-10-CM

## 2018-12-03 DIAGNOSIS — M79672 Pain in left foot: Secondary | ICD-10-CM

## 2018-12-03 NOTE — Progress Notes (Signed)
Subjective:   Patient ID: Wesley [name], male   DOB: 73 y.o.   MRN: [id number]   HPI Patient presents stating I am improving but I still have periods where he gets quite intensely sore.  States the boot seems to be making a difference but I know I need more support   ROS      Objective:  Physical Exam  Neurovascular status intact with patient noted to have diminished discomfort in the plantar fascial left with discomfort still upon deep palpation to the tissue with improvement upon superficial palpation with flatfoot deformity noted     Assessment:  Plantar fasciitis left improving still present     Plan:  Advised this patient on the importance of physical therapy stretching exercises and I did cast for functional orthotics with heel lift to try to take pressure off the plantar heel.  Patient will be seen back when ready and was given all instructions and we will check back after that and ultimately may require other procedure depending on response.  Today the orthotic casting were done and he will see ped orthotist for dispensing

## 2018-12-29 ENCOUNTER — Other Ambulatory Visit: Payer: Medicare Other | Admitting: Orthotics

## 2019-01-12 ENCOUNTER — Other Ambulatory Visit: Payer: Self-pay | Admitting: Urology

## 2019-01-12 DIAGNOSIS — R972 Elevated prostate specific antigen [PSA]: Secondary | ICD-10-CM

## 2019-01-13 ENCOUNTER — Ambulatory Visit: Payer: Medicare PPO | Admitting: Orthotics

## 2019-01-13 ENCOUNTER — Other Ambulatory Visit: Payer: Self-pay

## 2019-01-13 DIAGNOSIS — M722 Plantar fascial fibromatosis: Secondary | ICD-10-CM

## 2019-01-13 NOTE — Progress Notes (Signed)
Patient came in today to pick up custom made foot orthotics.  The goals were accomplished and the patient reported no dissatisfaction with said orthotics.  Patient was advised of breakin period and how to report any issues. 

## 2019-01-20 ENCOUNTER — Ambulatory Visit: Payer: Medicare PPO | Attending: Internal Medicine

## 2019-01-20 DIAGNOSIS — Z23 Encounter for immunization: Secondary | ICD-10-CM | POA: Insufficient documentation

## 2019-01-20 NOTE — Progress Notes (Signed)
   Covid-19 Vaccination Clinic  Name:  Wesley [name]    MRN: [id number] DOB: 06/24/1945  01/20/2019  Wesley [name] was observed post Covid-19 immunization for 15 minutes without incidence. He was provided with Vaccine Information Sheet and instruction to access the V-Safe system.   Wesley [name] was instructed to call 911 with any severe reactions post vaccine: Marland Kitchen Difficulty breathing  . Swelling of your face and throat  . A fast heartbeat  . A bad rash all over your body  . Dizziness and weakness    Immunizations Administered    Name Date Dose VIS Date Route   Pfizer COVID-19 Vaccine 01/20/2019  2:04 PM 0.3 mL 12/12/2018 Intramuscular   Manufacturer: [place]   Lot: [id number]   [place]: [id number]

## 2019-01-26 ENCOUNTER — Ambulatory Visit: Payer: Medicare Other | Admitting: Podiatry

## 2019-01-26 ENCOUNTER — Encounter: Payer: Self-pay | Admitting: Podiatry

## 2019-01-26 ENCOUNTER — Other Ambulatory Visit: Payer: Self-pay

## 2019-01-26 VITALS — Temp 97.1°F

## 2019-01-26 DIAGNOSIS — M722 Plantar fascial fibromatosis: Secondary | ICD-10-CM

## 2019-01-26 NOTE — Progress Notes (Signed)
Subjective:   Patient ID: Wesley [name], male   DOB: 74 y.o.   MRN: [id number]   HPI Patient presents stating that the left foot is improving and he has numerous questions about the future   ROS      Objective:  Physical Exam  Neurovascular status intact with patient's motion normal and good digital perfusion.  Patient is found to have diminishment of discomfort plantar left heel at the insertional point of the tendon into the calcaneus     Assessment:  Improving with acute fasciitis-like symptoms left     Plan:  H&P reviewed condition and at this point we discussed treatment options conservative and surgical with the possibility for shockwave.  Patient is discharged will be seen back as needed and understands different options for himself

## 2019-02-05 ENCOUNTER — Ambulatory Visit
Admission: RE | Admit: 2019-02-05 | Discharge: 2019-02-05 | Disposition: A | Discharge status: Home / Self Care | Payer: Medicare PPO | Source: Ambulatory Visit | Attending: Urology | Admitting: Urology

## 2019-02-05 ENCOUNTER — Other Ambulatory Visit: Payer: Self-pay

## 2019-02-05 DIAGNOSIS — R972 Elevated prostate specific antigen [PSA]: Secondary | ICD-10-CM

## 2019-02-05 MED ORDER — GADOBENATE DIMEGLUMINE 529 MG/ML IV SOLN
17.0000 mL | Freq: Once | INTRAVENOUS | Status: AC | PRN
  Administered 2019-02-05: 16:00:00 17 mL via INTRAVENOUS

## 2019-02-07 ENCOUNTER — Encounter: Payer: Self-pay | Admitting: Podiatry

## 2019-02-10 ENCOUNTER — Ambulatory Visit: Payer: Medicare PPO | Attending: Internal Medicine

## 2019-02-10 ENCOUNTER — Ambulatory Visit: Payer: Medicare PPO

## 2019-02-10 DIAGNOSIS — Z23 Encounter for immunization: Secondary | ICD-10-CM

## 2019-02-10 NOTE — Progress Notes (Signed)
   Covid-19 Vaccination Clinic  Name:  Wesley [name]    MRN: [id number] DOB: 04-01-1945  02/10/2019  [name] was observed post Covid-19 immunization for 30 minutes based on pre-vaccination screening without incidence. He was provided with Vaccine Information Sheet and instruction to access the V-Safe system.   [name] was instructed to call 911 with any severe reactions post vaccine: Marland Kitchen Difficulty breathing  . Swelling of your face and throat  . A fast heartbeat  . A bad rash all over your body  . Dizziness and weakness    Immunizations Administered    Name Date Dose VIS Date Route   Pfizer COVID-19 Vaccine 02/10/2019  8:42 AM 0.3 mL 12/12/2018 Intramuscular   Manufacturer: [place]   Lot: [id number]   [place]: [id number]

## 2019-03-30 ENCOUNTER — Other Ambulatory Visit: Payer: Self-pay | Admitting: Urology

## 2019-03-30 ENCOUNTER — Other Ambulatory Visit (HOSPITAL_COMMUNITY): Payer: Self-pay | Admitting: Urology

## 2019-03-30 DIAGNOSIS — R972 Elevated prostate specific antigen [PSA]: Secondary | ICD-10-CM

## 2019-04-20 ENCOUNTER — Other Ambulatory Visit: Payer: Self-pay

## 2019-04-20 ENCOUNTER — Encounter (HOSPITAL_BASED_OUTPATIENT_CLINIC_OR_DEPARTMENT_OTHER): Payer: Self-pay | Admitting: Urology

## 2019-04-20 NOTE — Progress Notes (Signed)
Spoke with patient via telephone for pre op interview. NPO after MN. Patient to take Wellbutrin and Norvasc AM of surgery with a sip of water. Patient verbalized understanding of using FLEET enema AM of surgery. Patient will need ISTAT 8 and EKG AM of surgery. Arrival time 0945.

## 2019-04-24 ENCOUNTER — Other Ambulatory Visit (HOSPITAL_COMMUNITY)
Admission: RE | Admit: 2019-04-24 | Discharge: 2019-04-24 | Disposition: A | Discharge status: Home / Self Care | Payer: Medicare PPO | Source: Ambulatory Visit | Attending: Urology | Admitting: Urology

## 2019-04-24 DIAGNOSIS — Z01812 Encounter for preprocedural laboratory examination: Secondary | ICD-10-CM | POA: Diagnosis present

## 2019-04-24 DIAGNOSIS — Z20822 Contact with and (suspected) exposure to covid-19: Secondary | ICD-10-CM | POA: Diagnosis not present

## 2019-04-24 LAB — SARS CORONAVIRUS 2 (TAT 6-24 HRS): SARS Coronavirus 2: NEGATIVE

## 2019-04-28 ENCOUNTER — Ambulatory Visit (HOSPITAL_BASED_OUTPATIENT_CLINIC_OR_DEPARTMENT_OTHER)
Admission: RE | Admit: 2019-04-28 | Discharge: 2019-04-28 | Disposition: A | Discharge status: Home / Self Care | Payer: Medicare PPO | Attending: Urology | Admitting: Urology

## 2019-04-28 ENCOUNTER — Ambulatory Visit (HOSPITAL_BASED_OUTPATIENT_CLINIC_OR_DEPARTMENT_OTHER): Payer: Medicare PPO | Admitting: Certified Registered"

## 2019-04-28 ENCOUNTER — Ambulatory Visit (HOSPITAL_COMMUNITY)
Admission: RE | Admit: 2019-04-28 | Discharge: 2019-04-28 | Disposition: A | Discharge status: Home / Self Care | Payer: Medicare PPO | Source: Ambulatory Visit | Attending: Urology | Admitting: Urology

## 2019-04-28 ENCOUNTER — Encounter (HOSPITAL_BASED_OUTPATIENT_CLINIC_OR_DEPARTMENT_OTHER)
Admission: RE | Disposition: A | Discharge status: Home / Self Care | Payer: Self-pay | Source: Home / Self Care | Attending: Urology

## 2019-04-28 ENCOUNTER — Other Ambulatory Visit: Payer: Self-pay

## 2019-04-28 ENCOUNTER — Encounter (HOSPITAL_BASED_OUTPATIENT_CLINIC_OR_DEPARTMENT_OTHER): Payer: Self-pay | Admitting: Urology

## 2019-04-28 DIAGNOSIS — R972 Elevated prostate specific antigen [PSA]: Secondary | ICD-10-CM

## 2019-04-28 DIAGNOSIS — Z79899 Other long term (current) drug therapy: Secondary | ICD-10-CM | POA: Diagnosis not present

## 2019-04-28 DIAGNOSIS — E785 Hyperlipidemia, unspecified: Secondary | ICD-10-CM | POA: Diagnosis not present

## 2019-04-28 DIAGNOSIS — F329 Major depressive disorder, single episode, unspecified: Secondary | ICD-10-CM | POA: Diagnosis not present

## 2019-04-28 DIAGNOSIS — C61 Malignant neoplasm of prostate: Secondary | ICD-10-CM | POA: Diagnosis not present

## 2019-04-28 DIAGNOSIS — N401 Enlarged prostate with lower urinary tract symptoms: Secondary | ICD-10-CM | POA: Insufficient documentation

## 2019-04-28 DIAGNOSIS — N138 Other obstructive and reflux uropathy: Secondary | ICD-10-CM

## 2019-04-28 DIAGNOSIS — I10 Essential (primary) hypertension: Secondary | ICD-10-CM | POA: Insufficient documentation

## 2019-04-28 HISTORY — PX: PROSTATE BIOPSY: SHX241

## 2019-04-28 HISTORY — PX: CYSTOSCOPY WITH INSERTION OF UROLIFT: SHX6678

## 2019-04-28 LAB — POCT I-STAT, CHEM 8
BUN: 11 mg/dL (ref 8–23)
Calcium, Ion: 1.15 mmol/L (ref 1.15–1.40)
Chloride: 100 mmol/L (ref 98–111)
Creatinine, Ser: 1.1 mg/dL (ref 0.61–1.24)
Glucose, Bld: 115 mg/dL — ABNORMAL HIGH (ref 70–99)
HCT: 50 % (ref 39.0–52.0)
Hemoglobin: 17 g/dL (ref 13.0–17.0)
Potassium: 3.4 mmol/L — ABNORMAL LOW (ref 3.5–5.1)
Sodium: 139 mmol/L (ref 135–145)
TCO2: 27 mmol/L (ref 22–32)

## 2019-04-28 SURGERY — CYSTOSCOPY WITH INSERTION OF UROLIFT
Anesthesia: General | Site: Prostate

## 2019-04-28 MED ORDER — IBUPROFEN 100 MG/5ML PO SUSP
200.0000 mg | Freq: Four times a day (QID) | ORAL | Status: DC | PRN
  Filled 2019-04-28: qty 20

## 2019-04-28 MED ORDER — DEXAMETHASONE SODIUM PHOSPHATE 10 MG/ML IJ SOLN
INTRAMUSCULAR | Status: AC
  Filled 2019-04-28: qty 1

## 2019-04-28 MED ORDER — ONDANSETRON HCL 4 MG/2ML IJ SOLN
INTRAMUSCULAR | Status: AC
  Filled 2019-04-28: qty 2

## 2019-04-28 MED ORDER — FLEET ENEMA 7-19 GM/118ML RE ENEM: 1.0000 | ENEMA | Freq: Once | RECTAL | Status: DC

## 2019-04-28 MED ORDER — STERILE WATER FOR IRRIGATION IR SOLN
Status: DC | PRN
  Administered 2019-04-28: 3000 mL

## 2019-04-28 MED ORDER — FENTANYL CITRATE (PF) 100 MCG/2ML IJ SOLN
INTRAMUSCULAR | Status: AC
  Filled 2019-04-28: qty 2

## 2019-04-28 MED ORDER — CIPROFLOXACIN HCL 500 MG PO TABS
500.0000 mg | ORAL_TABLET | Freq: Two times a day (BID) | ORAL | 0 refills | Status: DC
Start: 2019-04-28 — End: 2020-12-21

## 2019-04-28 MED ORDER — LACTATED RINGERS IV SOLN: INTRAVENOUS | Status: DC

## 2019-04-28 MED ORDER — IBUPROFEN 200 MG PO TABS: 200.0000 mg | ORAL_TABLET | Freq: Four times a day (QID) | ORAL | Status: DC | PRN

## 2019-04-28 MED ORDER — CEFAZOLIN SODIUM-DEXTROSE 2-4 GM/100ML-% IV SOLN
INTRAVENOUS | Status: AC
  Filled 2019-04-28: qty 100

## 2019-04-28 MED ORDER — PROPOFOL 10 MG/ML IV BOLUS
INTRAVENOUS | Status: AC
  Filled 2019-04-28: qty 20

## 2019-04-28 MED ORDER — FENTANYL CITRATE (PF) 100 MCG/2ML IJ SOLN
INTRAMUSCULAR | Status: DC | PRN
  Administered 2019-04-28 (×2): 25 ug via INTRAVENOUS
  Administered 2019-04-28: 50 ug via INTRAVENOUS

## 2019-04-28 MED ORDER — FENTANYL CITRATE (PF) 100 MCG/2ML IJ SOLN
25.0000 ug | INTRAMUSCULAR | Status: DC | PRN
  Administered 2019-04-28: 25 ug via INTRAVENOUS

## 2019-04-28 MED ORDER — ONDANSETRON HCL 4 MG/2ML IJ SOLN
INTRAMUSCULAR | Status: DC | PRN
  Administered 2019-04-28: 4 mg via INTRAVENOUS

## 2019-04-28 MED ORDER — PROPOFOL 10 MG/ML IV BOLUS
INTRAVENOUS | Status: DC | PRN
  Administered 2019-04-28: 180 mg via INTRAVENOUS

## 2019-04-28 MED ORDER — LIDOCAINE 2% (20 MG/ML) 5 ML SYRINGE
INTRAMUSCULAR | Status: AC
  Filled 2019-04-28: qty 5

## 2019-04-28 MED ORDER — ONDANSETRON HCL 4 MG/2ML IJ SOLN: 4.0000 mg | Freq: Once | INTRAMUSCULAR | Status: DC | PRN

## 2019-04-28 MED ORDER — DEXAMETHASONE SODIUM PHOSPHATE 10 MG/ML IJ SOLN
INTRAMUSCULAR | Status: DC | PRN
  Administered 2019-04-28: 10 mg via INTRAVENOUS

## 2019-04-28 MED ORDER — LIDOCAINE 2% (20 MG/ML) 5 ML SYRINGE
INTRAMUSCULAR | Status: DC | PRN
  Administered 2019-04-28: 80 mg via INTRAVENOUS

## 2019-04-28 MED ORDER — MEPERIDINE HCL 25 MG/ML IJ SOLN: 6.2500 mg | INTRAMUSCULAR | Status: DC | PRN

## 2019-04-28 MED ORDER — CEFAZOLIN SODIUM-DEXTROSE 2-4 GM/100ML-% IV SOLN
2.0000 g | Freq: Once | INTRAVENOUS | Status: AC
  Administered 2019-04-28: 2 g via INTRAVENOUS

## 2019-04-28 SURGICAL SUPPLY — 38 items
BAG DRAIN URO-CYSTO SKYTR STRL (DRAIN) ×4 IMPLANT
BAG DRN RND TRDRP ANRFLXCHMBR (UROLOGICAL SUPPLIES)
BAG DRN UROCATH (DRAIN) ×2
BAG URINE DRAIN 2000ML AR STRL (UROLOGICAL SUPPLIES) IMPLANT
BAG URINE LEG 500ML (DRAIN) IMPLANT
CATH COUDE FOLEY 2W 5CC 18FR (CATHETERS) IMPLANT
CATH FOLEY 2WAY SLVR  5CC 16FR (CATHETERS)
CATH FOLEY 2WAY SLVR  5CC 18FR (CATHETERS)
CATH FOLEY 2WAY SLVR 5CC 16FR (CATHETERS) IMPLANT
CATH FOLEY 2WAY SLVR 5CC 18FR (CATHETERS) IMPLANT
CLOTH BEACON ORANGE TIMEOUT ST (SAFETY) ×4 IMPLANT
DRSG TELFA 3X8 NADH (GAUZE/BANDAGES/DRESSINGS) IMPLANT
ELECT REM PT RETURN 9FT ADLT (ELECTROSURGICAL) ×4
ELECTRODE REM PT RTRN 9FT ADLT (ELECTROSURGICAL) ×2 IMPLANT
GLOVE BIO SURGEON STRL SZ7.5 (GLOVE) ×4 IMPLANT
GLOVE BIO SURGEON STRL SZ8 (GLOVE) IMPLANT
GOWN STRL REUS W/ TWL XL LVL3 (GOWN DISPOSABLE) ×2 IMPLANT
GOWN STRL REUS W/TWL XL LVL3 (GOWN DISPOSABLE) ×4
INST BIOPSY MAXCORE 18GX25 (NEEDLE) ×6 IMPLANT
INSTR BIOPSY MAXCORE 18GX20 (NEEDLE) IMPLANT
KIT TURNOVER CYSTO (KITS) ×4 IMPLANT
MANIFOLD NEPTUNE II (INSTRUMENTS) IMPLANT
NDL SAFETY ECLIPSE 18X1.5 (NEEDLE) IMPLANT
NDL SPNL 22GX7 QUINCKE BK (NEEDLE) IMPLANT
NEEDLE HYPO 18GX1.5 SHARP (NEEDLE)
NEEDLE HYPO 22GX1.5 SAFETY (NEEDLE) IMPLANT
NEEDLE SPNL 22GX7 QUINCKE BK (NEEDLE) IMPLANT
NS IRRIG 500ML POUR BTL (IV SOLUTION) IMPLANT
PAD DRESSING TELFA 3X8 NADH (GAUZE/BANDAGES/DRESSINGS) IMPLANT
SURGILUBE 2OZ TUBE FLIPTOP (MISCELLANEOUS) IMPLANT
SYR CONTROL 10ML LL (SYRINGE) IMPLANT
SYSTEM UROLIFT (Male Continence) ×8 IMPLANT
TOWEL OR 17X26 10 PK STRL BLUE (TOWEL DISPOSABLE) IMPLANT
TRAY CYSTO PACK (CUSTOM PROCEDURE TRAY) ×4 IMPLANT
TUBE CONNECTING 12'X1/4 (SUCTIONS)
TUBE CONNECTING 12X1/4 (SUCTIONS) IMPLANT
UNDERPAD 30X30 (UNDERPADS AND DIAPERS) ×4 IMPLANT
WATER STERILE IRR 3000ML UROMA (IV SOLUTION) ×4 IMPLANT

## 2019-04-28 NOTE — Transfer of Care (Signed)
Immediate Anesthesia Transfer of Care Note  Patient: Wesley [name]  Procedure(s) Performed: CYSTOSCOPY WITH INSERTION OF UROLIFT (N/A Prostate) BIOPSY TRANSRECTAL ULTRASONIC PROSTATE (TUBP) (N/A Bladder)  Patient Location: PACU  Anesthesia Type:General  Level of Consciousness: awake, alert  and oriented  Airway & Oxygen Therapy: Patient Spontanous Breathing and Patient connected to nasal cannula oxygen  Post-op Assessment: Report given to RN and Post -op Vital signs reviewed and stable  Post vital signs: Reviewed and stable  Last Vitals:  Vitals Value Taken Time  BP 152/89 04/28/19 1152  Temp    Pulse 73 04/28/19 1153  Resp 11 04/28/19 1153  SpO2 99 % 04/28/19 1153  Vitals shown include unvalidated device data.  Last Pain:  Vitals:   04/28/19 0907  TempSrc: Oral  PainSc: 0-No pain      Patients Stated Pain Goal: 5 ([id number])  Complications: No apparent anesthesia complications

## 2019-04-28 NOTE — Anesthesia Preprocedure Evaluation (Signed)
Anesthesia Evaluation  Patient identified by MRN, date of birth, ID band Patient awake    Reviewed: Allergy & Precautions, NPO status , Patient's Chart, lab work & pertinent test results  Airway Mallampati: I       Dental no notable dental hx. (+) Teeth Intact   Pulmonary neg pulmonary ROS,    Pulmonary exam normal breath sounds clear to auscultation       Cardiovascular hypertension, Pt. on medications Normal cardiovascular exam Rhythm:Regular Rate:Normal     Neuro/Psych  Headaches, PSYCHIATRIC DISORDERS Depression    GI/Hepatic negative GI ROS, Neg liver ROS,   Endo/Other  negative endocrine ROS  Renal/GU negative Renal ROS  negative genitourinary   Musculoskeletal negative musculoskeletal ROS (+)   Abdominal Normal abdominal exam  (+)   Peds  Hematology negative hematology ROS (+)   Anesthesia Other Findings   Reproductive/Obstetrics                             Anesthesia Physical Anesthesia Plan  ASA: II  Anesthesia Plan: General   Post-op Pain Management:    Induction:   PONV Risk Score and Plan: 2 and Ondansetron  Airway Management Planned: LMA  Additional Equipment: None  Intra-op Plan:   Post-operative Plan: Extubation in OR  Informed Consent: I have reviewed the patients History and Physical, chart, labs and discussed the procedure including the risks, benefits and alternatives for the proposed anesthesia with the patient or authorized representative who has indicated his/her understanding and acceptance.     Dental advisory given  Plan Discussed with: CRNA  Anesthesia Plan Comments:         Anesthesia Quick Evaluation

## 2019-04-28 NOTE — Anesthesia Procedure Notes (Signed)
Procedure Name: LMA Insertion Date/Time: 04/28/2019 10:51 AM Performed by: Culley Hedeen D, CRNA Pre-anesthesia Checklist: Patient identified, Emergency Drugs available, Suction available and Patient being monitored Patient Re-evaluated:Patient Re-evaluated prior to induction Oxygen Delivery Method: Circle system utilized Preoxygenation: Pre-oxygenation with 100% oxygen Induction Type: IV induction Ventilation: Mask ventilation without difficulty LMA: LMA inserted LMA Size: 4.0 Tube type: Oral Number of attempts: 1 Placement Confirmation: positive ETCO2 and breath sounds checked- equal and bilateral Tube secured with: Tape Dental Injury: Teeth and Oropharynx as per pre-operative assessment

## 2019-04-28 NOTE — Op Note (Signed)
Preoperative diagnosis: BPH, elevated PSA Postoperative diagnosis: Same  Procedure: Cystoscopy, UroLift x4 implants; Transrectal ultrasound prostate, Transrectal ultrasound guidance for biopsy, Prostate biopsy  Surgeon: Wesley [name]  Anesthesia: General  Indication for procedure: Wesley [name] is a 74 year old male with BPH and lower urinary tract symptoms.  He has been taking tamsulosin for many months and hopes to get off medicines.  Also his PSA is elevated and MRI revealed a left anterior lesion PI-RADS 4.  Findings: On cystoscopy there is obstructing lateral lobe hypertrophy.  Bladder mildly trabeculated but no stone or foreign body.  No mucosal lesions of the bladder.  On digital rectal exam the prostate was about 50 g and smooth without hard area or nodule.  Transrectal ultrasound of prostate revealed normal seminal vesicles and prostate, although I did think there was a hypoechoic area in the left mid anterior with some bulging of the capsule similar to the MRI.  This was biopsied with the left lateral mid and left medial mid biopsies.  In fact the initial biopsy gun was 22 cm and I switched to the 25 cm and took 2 more samples in this area to get adequate anterior sampling. Prostate measured 5.59 cm in length, 5.17 cm in width, 3.85 cm in height Prostate volume = 57 grams  Prostate biopsy standard 12 core biopsy taken, then I went back and took 2 more from the left lateral mid and left medial mid to get anterior for a total of 14.   Description of procedure: After consent was obtained patient brought to the operating room.  After adequate anesthesia he was placed lithotomy position and prepped and draped in the usual sterile fashion.  A timeout was performed to confirm the patient and procedure.  The prostate and the bladder were carefully inspected with the cystoscope and then I switched out to the 71F cystoscope was inserted into the bladder. The cystoscopy bridge was replaced with a  UroLift delivery device.The first treatment site was the patient's left side side approximately 1.5cm distal to the bladder neck. The distal tip of the delivery device was then angled laterally and brought anteriorly approximately 20 degrees at this position to compress the lateral lobe. The trigger was pulled, thereby deploying a needle containing the implant through the prostate. The needle was then retracted, allowing one end of the implant to be delivered to the capsular surface of the prostate. The implant was then tensioned to assure capsular seating and removal of slack monofilament. The device was then angled back toward midline and slowly advanced proximally until cystoscopic verification of the monofilament being centered in the delivery bay. The urethral end piece was then affixed to the monofilament thereby tailoring the size of the implant. Excess filament was then severed. The delivery device was then re-advanced into the bladder. The delivery device was then replaced with cystoscope and bridge and the implant location and opening effect was confirmed cystoscopically. The same procedure was then repeated on the right side, and two additional implants were delivered just proximal to the verumontanum, again one on left and one on the right side of the prostate, following the same technique. A final cystoscopy was conducted first to inspect the location and state of each implant and second, to confirm the presence of a continuous anterior channel was present through the prostatic urethra with irrigation flow turned off. Four Implants were delivered in total. Following this, the scope was removed and no foley catheter was placed.   I then performed an exam under  anesthesia/digital rectal exam.  The ultrasound probe was then passed per rectum and the prostate ultrasound performed in the prostate measured.  Representative images obtained.  And then took standard 12 core biopsy but was not happy with the  anterior reach and the longer biopsy gun was obtained.  Took another sample at the left mid lateral and left mid medial with appropriate anterior sampling in reach.  On final digital rectal exam no bleeding was noted.  The patient was then awakened and taken to recovery room in stable condition.  Complications: None  Blood loss: Minimal  Specimens to pathology: #1 right base lateral #2 right base medial #3 right mid lateral #4 right mid medial #5 right apex lateral #6 right apex medial #7 left base lateral #8 left base medial #9 left mid lateral x2 #10 left mid medial x2 #11 left apex lateral #12 left apex medial  Drains: None  Disposition: Patient stable to PACU

## 2019-04-28 NOTE — Anesthesia Postprocedure Evaluation (Signed)
Anesthesia Post Note  Patient: HAWTHORN PATI  Procedure(s) Performed: CYSTOSCOPY WITH INSERTION OF UROLIFT (N/A Prostate) BIOPSY TRANSRECTAL ULTRASONIC PROSTATE (TUBP) (N/A Bladder)     Patient location during evaluation: Phase II Anesthesia Type: General Level of consciousness: awake Pain management: pain level controlled Vital Signs Assessment: post-procedure vital signs reviewed and stable Respiratory status: spontaneous breathing Cardiovascular status: stable Postop Assessment: no apparent nausea or vomiting Anesthetic complications: no    Last Vitals:  Vitals:   04/28/19 1230 04/28/19 1245  BP: 136/76 136/81  Pulse: 60 67  Resp: 17 16  Temp:    SpO2: 95% 96%    Last Pain:  Vitals:   04/28/19 1245  TempSrc:   PainSc: 2    Pain Goal: Patients Stated Pain Goal: 5 (04/28/19 0907)                 Huston Foley

## 2019-04-28 NOTE — Discharge Instructions (Signed)
Prostatic Urethral Lift, Care After This sheet gives you information about how to care for yourself after your procedure. Your health care provider may also give you more specific instructions. If you have problems or questions, contact your health care provider. What can I expect after the procedure? After the procedure, it is common to have:  Discomfort or burning when urinating.  An increased urge to urinate.  More frequent urination.  Urine that is slightly blood-tinged. These symptoms should go away after a few days. Follow these instructions at home:   Take over-the-counter and prescription medicines only as told by your health care provider.  Do not drive for 24 hours if you were given a medicine to help you relax (sedative).  Do not drive or use heavy machinery while taking prescription pain medicine.  Do not lift anything that is heavier than 10 lb (4.5 kg) until your health care provider says that this is safe.  Return to your normal activities as told by your health care provider. Ask your health care provider what activities are safe for you. Ask when you can return to sexual activity.  Drink enough fluid to keep your urine clear or pale yellow.  Keep all follow-up visits as told by your health care provider. This is important. Contact a health care provider if:  You have chills or a fever.  You have pain when passing urine.  You have bright red blood or blood clots in your urine.  You have difficulty passing urine.  You have leaking of urine (incontinence). Get help right away if:  You have chest pain or shortness of breath.  You have leg pain or swelling.  You cannot pass urine. Summary  After the procedure, it is common to have discomfort or burning when urinating, an increased urge to urinate, more frequent urination, and urine that is slightly blood-tinged.  Do not drive for 24 hours if you were given a medicine to help you relax (sedative). Do not  drive or use heavy machinery while taking prescription pain medicine.  Do not lift anything that is heavier than 10 lb (4.5 kg) until your health care provider says that this is safe.  Return to your normal activities as told by your health care provider. This information is not intended to replace advice given to you by your health care provider. Make sure you discuss any questions you have with your health care provider. Document Revised: 11/30/2016 Document Reviewed: 02/07/2016 Elsevier Patient Education  [place]. Transrectal Ultrasound-Guided Prostate Biopsy, Care After This sheet gives you information about how to care for yourself after your procedure. Your doctor may also give you more specific instructions. If you have problems or questions, contact your doctor. What can I expect after the procedure? After the procedure, it is common to have:  Pain and discomfort in your butt, especially while sitting.  Pink-colored pee (urine), due to small amounts of blood in the pee.  Burning while peeing (urinating).  Blood in your poop (stool).  Bleeding from your butt.  Blood in your semen. Follow these instructions at home: Medicines  Take over-the-counter and prescription medicines only as told by your doctor.  If you were prescribed antibiotic medicine, take it as told by your doctor. Do not stop taking the antibiotic even if you start to feel better. Activity   Do not drive for 24 hours if you were given a medicine to help you relax (sedative) during your procedure.  Return to your normal activities  as told by your doctor. Ask your doctor what activities are safe for you.  Ask your doctor when it is okay for you to have sex.  Do not lift anything that is heavier than 10 lb (4.5 kg), or the limit that you are told, until your doctor says that it is safe. General instructions   Drink enough water to keep your pee pale yellow.  Watch your pee, poop, and semen for  new bleeding or bleeding that gets worse.  Keep all follow-up visits as told by your doctor. This is important. Contact a doctor if you:  Have blood clots in your pee or poop.  Notice that your pee smells bad or unusual.  Have very bad belly pain.  Have trouble peeing.  Notice that your lower belly feels firm.  Have blood in your pee for more than 2 weeks after the procedure.  Have blood in your semen for more than 2 months after the procedure.  Have problems getting an erection.  Feel sick to your stomach (nauseous).  Throw up (vomit).  Have new or worse bleeding in your pee, poop, or semen. Get help right away if you:  Have a fever or chills.  Have bright red pee.  Have very bad pain that does not get better with medicine.  Cannot pee. Summary  After this procedure, it is common to have pain and discomfort around your butt, especially while sitting.  You may have blood in your pee and poop.  It is common to have blood in your semen for 1-2 months.  If you were prescribed antibiotic medicine, take it as told by your doctor. Do not stop taking the antibiotic even if you start to feel better.  Get help right away if you have a fever or chills. This information is not intended to replace advice given to you by your health care provider. Make sure you discuss any questions you have with your health care provider. Document Revised: 04/09/2018 Document Reviewed: 10/16/2016 Elsevier Patient Education  [place] Instructions  Activity: Get plenty of rest for the remainder of the day. A responsible individual must stay with you for 24 hours following the procedure.  For the next 24 hours, DO NOT: -Drive a car -Paediatric nurse -Drink alcoholic beverages -Take any medication unless instructed by your physician -Make any legal decisions or sign important papers.  Meals: Start with liquid foods such as gelatin or soup. Progress  to regular foods as tolerated. Avoid greasy, spicy, heavy foods. If nausea and/or vomiting occur, drink only clear liquids until the nausea and/or vomiting subsides. Call your physician if vomiting continues.  Special Instructions/Symptoms: Your throat may feel dry or sore from the anesthesia or the breathing tube placed in your throat during surgery. If this causes discomfort, gargle with warm salt water. The discomfort should disappear within 24 hours.  If you had a scopolamine patch placed behind your ear for the management of post- operative nausea and/or vomiting:  1. The medication in the patch is effective for 72 hours, after which it should be removed.  Wrap patch in a tissue and discard in the trash. Wash hands thoroughly with soap and water. 2. You may remove the patch earlier than 72 hours if you experience unpleasant side effects which may include dry mouth, dizziness or visual disturbances. 3. Avoid touching the patch. Wash your hands with soap and water after contact with the patch.

## 2019-04-28 NOTE — H&P (Signed)
H&P  Chief Complaint: Elevated PSA, BPH  History of Present Illness: [name] is a 74 year old male with a history of BPH, frequency and urgency.  He tried tamsulosin but had bothersome side effects such as lightheadedness.  He also has a rising PSA up to 5.26 with an MRI of the prostate done February 2021 revealing a 52 g prostate and a PI-RADS 4 lesion left anterior, left mid to apical.  He has been well and continues tamsulosin.  He has had no fevers, chills or dysuria.  No gross hematuria.  Past Medical History:  Diagnosis Date  . Depression   . Diverticulosis   . Hyperlipidemia   . Hypertension   . Migraine    Past Surgical History:  Procedure Laterality Date  . GANGLION CYST EXCISION      Home Medications:  Medications Prior to Admission  Medication Sig Dispense Refill Last Dose  . amLODipine (NORVASC) 10 MG tablet Take 10 mg by mouth daily.   04/28/2019 at 0830  . atorvastatin (LIPITOR) 10 MG tablet Take 10 mg by mouth daily.   04/27/2019 at Unknown time  . buPROPion (WELLBUTRIN XL) 150 MG 24 hr tablet Take 300 mg by mouth daily.    04/27/2019 at Unknown time  . hydrochlorothiazide (MICROZIDE) 12.5 MG capsule Take 12.5 mg by mouth daily.   04/28/2019 at 0830  . tamsulosin (FLOMAX) 0.4 MG CAPS capsule Take 0.4 mg by mouth daily after breakfast.   04/28/2019 at 0830  . fluticasone (FLONASE) 50 MCG/ACT nasal spray Place 2 sprays into the nose daily as needed for allergies.    More than a month at Unknown time  . valACYclovir (VALTREX) 1000 MG tablet Take 1,000 mg by mouth every 12 (twelve) hours as needed (for flare up).    More than a month at Unknown time   Allergies:  Allergies  Allergen Reactions  . Iohexol      Code: HIVES, Desc: PT DEVELOPED HIVES DURING IV ADMINISTRATION/   . Ivp Dye [Iodinated Diagnostic Agents] Hives  . Other Hives  . Penicillins   . Percocet [Oxycodone-Acetaminophen] Itching  . Zetia [Ezetimibe] Nausea And Vomiting    Family History  Problem  Relation Age of Onset  . Heart disease Mother   . Pneumonia Father    Social History:  reports that he has never smoked. He has quit using smokeless tobacco.  His smokeless tobacco use included chew. He reports current alcohol use of about 1.0 standard drinks of alcohol per week. He reports that he does not use drugs.  ROS: A complete review of systems was performed.  All systems are negative except for pertinent findings as noted. Review of Systems  Psychiatric/Behavioral: The patient is nervous/anxious.   Working with his therapist.    Physical Exam:  Vital signs in last 24 hours: Temp:  [97.9 F (36.6 C)] 97.9 F (36.6 C) (04/27 0907) Pulse Rate:  [70] 70 (04/27 0907) Resp:  [16] 16 (04/27 0907) BP: (149)/(81) 149/81 (04/27 0907) SpO2:  [98 %] 98 % (04/27 0907) Weight:  [82.3 kg] 82.3 kg (04/27 0907) General:  Alert and oriented, No acute distress HEENT: Normocephalic, atraumatic Cardiovascular: Regular rate and rhythm Lungs: Regular rate and effort Abdomen: Soft, nontender, nondistended, no abdominal masses Back: No CVA tenderness Extremities: No edema Neurologic: Grossly intact  Laboratory Data:  Results for orders placed or performed during the hospital encounter of 04/28/19 (from the past 24 hour(s))  I-STAT, chem 8     Status: Abnormal  Collection Time: 04/28/19  9:08 AM  Result Value Ref Range   Sodium 139 135 - 145 mmol/L   Potassium 3.4 (L) 3.5 - 5.1 mmol/L   Chloride 100 98 - 111 mmol/L   BUN 11 8 - 23 mg/dL   Creatinine, Ser 1.10 0.61 - 1.24 mg/dL   Glucose, Bld 115 (H) 70 - 99 mg/dL   Calcium, Ion 1.15 1.15 - 1.40 mmol/L   TCO2 27 22 - 32 mmol/L   Hemoglobin 17.0 13.0 - 17.0 g/dL   HCT 50.0 39.0 - 52.0 %   Recent Results (from the past 240 hour(s))  SARS CORONAVIRUS 2 (TAT 6-24 HRS) Nasopharyngeal Nasopharyngeal Swab     Status: None   Collection Time: 04/24/19  8:41 AM   Specimen: Nasopharyngeal Swab  Result Value Ref Range Status   SARS  Coronavirus 2 NEGATIVE NEGATIVE Final    Comment: (NOTE) SARS-CoV-2 target nucleic acids are NOT DETECTED. The SARS-CoV-2 RNA is generally detectable in upper and lower respiratory specimens during the acute phase of infection. Negative results do not preclude SARS-CoV-2 infection, do not rule out co-infections with other pathogens, and should not be used as the sole basis for treatment or other patient management decisions. Negative results must be combined with clinical observations, patient history, and epidemiological information. The expected result is Negative. Fact Sheet for Patients: SugarRoll.be Fact Sheet for Healthcare Providers: https://www.woods-mathews.com/ This test is not yet approved or cleared by the [place] FDA and  has been authorized for detection and/or diagnosis of SARS-CoV-2 by FDA under an Emergency Use Authorization (EUA). This EUA will remain  in effect (meaning this test can be used) for the duration of the COVID-19 declaration under Section 56 4(b)(1) of the Act, 21 U.S.C. section 360bbb-3(b)(1), unless the authorization is terminated or revoked sooner. Performed at [facility], [place] [address], [place], [place] [address]    Creatinine: Recent Labs    04/28/19 0908  CREATININE 1.10    Impression/Assessmentplan:  I discussed with the patient the nature, potential benefits, risks and alternatives to cystoscopy, UroLift, transrectal ultrasound of the prostate with biopsy, including side effects of the proposed treatment, the likelihood of the patient achieving the goals of the procedure, and any potential problems that might occur during the procedure or recuperation.we discussed again the difference between cognitive and fusion biopsy, today will be a cognitive.  We also discussed potential interaction of the UroLift device and the biopsy needle which could cut one of the sutures.  All questions answered.   He elects to proceed.  We discussed possibility of postop catheter.  Festus Aloe 04/28/2019, 9:27 AM

## 2019-05-03 LAB — SURGICAL PATHOLOGY

## 2019-05-19 DIAGNOSIS — N5201 Erectile dysfunction due to arterial insufficiency: Secondary | ICD-10-CM | POA: Diagnosis not present

## 2019-05-19 DIAGNOSIS — C61 Malignant neoplasm of prostate: Secondary | ICD-10-CM | POA: Diagnosis not present

## 2019-05-19 DIAGNOSIS — N401 Enlarged prostate with lower urinary tract symptoms: Secondary | ICD-10-CM | POA: Diagnosis not present

## 2019-05-19 DIAGNOSIS — R350 Frequency of micturition: Secondary | ICD-10-CM | POA: Diagnosis not present

## 2019-05-26 DIAGNOSIS — F341 Dysthymic disorder: Secondary | ICD-10-CM | POA: Diagnosis not present

## 2019-05-26 DIAGNOSIS — F411 Generalized anxiety disorder: Secondary | ICD-10-CM | POA: Diagnosis not present

## 2019-06-08 DIAGNOSIS — F341 Dysthymic disorder: Secondary | ICD-10-CM | POA: Diagnosis not present

## 2019-06-08 DIAGNOSIS — F411 Generalized anxiety disorder: Secondary | ICD-10-CM | POA: Diagnosis not present

## 2019-07-07 DIAGNOSIS — F341 Dysthymic disorder: Secondary | ICD-10-CM | POA: Diagnosis not present

## 2019-07-07 DIAGNOSIS — F411 Generalized anxiety disorder: Secondary | ICD-10-CM | POA: Diagnosis not present

## 2019-07-28 DIAGNOSIS — F411 Generalized anxiety disorder: Secondary | ICD-10-CM | POA: Diagnosis not present

## 2019-07-28 DIAGNOSIS — F341 Dysthymic disorder: Secondary | ICD-10-CM | POA: Diagnosis not present

## 2019-08-17 DIAGNOSIS — R05 Cough: Secondary | ICD-10-CM | POA: Diagnosis not present

## 2019-08-17 DIAGNOSIS — Z20828 Contact with and (suspected) exposure to other viral communicable diseases: Secondary | ICD-10-CM | POA: Diagnosis not present

## 2019-08-25 DIAGNOSIS — R351 Nocturia: Secondary | ICD-10-CM | POA: Diagnosis not present

## 2019-08-25 DIAGNOSIS — N5201 Erectile dysfunction due to arterial insufficiency: Secondary | ICD-10-CM | POA: Diagnosis not present

## 2019-08-25 DIAGNOSIS — C61 Malignant neoplasm of prostate: Secondary | ICD-10-CM | POA: Diagnosis not present

## 2019-08-25 DIAGNOSIS — N401 Enlarged prostate with lower urinary tract symptoms: Secondary | ICD-10-CM | POA: Diagnosis not present

## 2019-08-26 DIAGNOSIS — G5702 Lesion of sciatic nerve, left lower limb: Secondary | ICD-10-CM | POA: Diagnosis not present

## 2019-09-01 DIAGNOSIS — F411 Generalized anxiety disorder: Secondary | ICD-10-CM | POA: Diagnosis not present

## 2019-09-01 DIAGNOSIS — F341 Dysthymic disorder: Secondary | ICD-10-CM | POA: Diagnosis not present

## 2019-09-15 DIAGNOSIS — I10 Essential (primary) hypertension: Secondary | ICD-10-CM | POA: Diagnosis not present

## 2019-09-15 DIAGNOSIS — R7303 Prediabetes: Secondary | ICD-10-CM | POA: Diagnosis not present

## 2019-09-15 DIAGNOSIS — Z23 Encounter for immunization: Secondary | ICD-10-CM | POA: Diagnosis not present

## 2019-09-15 DIAGNOSIS — M545 Low back pain: Secondary | ICD-10-CM | POA: Diagnosis not present

## 2019-09-15 DIAGNOSIS — F330 Major depressive disorder, recurrent, mild: Secondary | ICD-10-CM | POA: Diagnosis not present

## 2019-09-15 DIAGNOSIS — E7800 Pure hypercholesterolemia, unspecified: Secondary | ICD-10-CM | POA: Diagnosis not present

## 2019-09-17 DIAGNOSIS — F411 Generalized anxiety disorder: Secondary | ICD-10-CM | POA: Diagnosis not present

## 2019-09-17 DIAGNOSIS — F341 Dysthymic disorder: Secondary | ICD-10-CM | POA: Diagnosis not present

## 2019-10-09 ENCOUNTER — Encounter: Payer: Self-pay | Admitting: Physical Therapy

## 2019-10-09 ENCOUNTER — Ambulatory Visit: Payer: Medicare PPO | Attending: Family Medicine | Admitting: Physical Therapy

## 2019-10-09 ENCOUNTER — Other Ambulatory Visit: Payer: Self-pay

## 2019-10-09 DIAGNOSIS — R293 Abnormal posture: Secondary | ICD-10-CM | POA: Insufficient documentation

## 2019-10-09 DIAGNOSIS — M6281 Muscle weakness (generalized): Secondary | ICD-10-CM | POA: Insufficient documentation

## 2019-10-09 DIAGNOSIS — M25552 Pain in left hip: Secondary | ICD-10-CM | POA: Diagnosis not present

## 2019-10-09 NOTE — Patient Instructions (Addendum)
Access Code: [email or web address] URL: [email or web address] Date: 10/09/2019 Prepared by: Venetia Night Sharline Lehane  Exercises Supine Piriformis Stretch with Foot on Ground - 1 x daily - 7 x weekly - 1 sets - 2 reps - 20 hold Supine Figure 4 Piriformis Stretch - 1 x daily - 7 x weekly - 1 sets - 2 reps - 20 hold Hooklying Single Knee to Chest Stretch - 1 x daily - 7 x weekly - 1 sets - 2 reps - 20 hold Standing Single Leg Stance with Counter Support - 1 x daily - 7 x weekly - 1 sets - 3 reps - 10 hold  Trigger Point Dry Needling  . What is Trigger Point Dry Needling (DN)? o DN is a physical therapy technique used to treat muscle pain and dysfunction. Specifically, DN helps deactivate muscle trigger points (muscle knots).  o A thin filiform needle is used to penetrate the skin and stimulate the underlying trigger point. The goal is for a local twitch response (LTR) to occur and for the trigger point to relax. No medication of any kind is injected during the procedure.   . What Does Trigger Point Dry Needling Feel Like?  o The procedure feels different for each individual patient. Some patients report that they do not actually feel the needle enter the skin and overall the process is not painful. Very mild bleeding may occur. However, many patients feel a deep cramping in the muscle in which the needle was inserted. This is the local twitch response.   Marland Kitchen How Will I feel after the treatment? o Soreness is normal, and the onset of soreness may not occur for a few hours. Typically this soreness does not last longer than two days.  o Bruising is uncommon, however; ice can be used to decrease any possible bruising.  o In rare cases feeling tired or nauseous after the treatment is normal. In addition, your symptoms may get worse before they get better, this period will typically not last longer than 24 hours.   . What Can I do After My Treatment? o Increase your hydration by drinking more water for the  next 24 hours. o You may place ice or heat on the areas treated that have become sore, however, do not use heat on inflamed or bruised areas. Heat often brings more relief post needling. o You can continue your regular activities, but vigorous activity is not recommended initially after the treatment for 24 hours. o DN is best combined with other physical therapy such as strengthening, stretching, and other therapies.    [place] [address], [place], [place] [address] Phone # [phone] Fax [phone]

## 2019-10-09 NOTE — Therapy (Signed)
[facility] [address] [place], [place], [address] Phone: [phone]   Fax:  [phone]  Physical Therapy Treatment  Patient Details  Name: Wesley [name] MRN: [id number] Date of Birth: 1945/11/14 Referring Provider (PT): Glenis Smoker, MD   Encounter Date: 10/09/2019   PT End of Session - 10/09/19 0836    Visit Number 1    Number of Visits 12    Date for PT Re-Evaluation 01/01/20    Authorization Type [employer] - submitting for auth 1x/week x 12 weeks    PT Start Time 0840    PT Stop Time 0930    PT Time Calculation (min) 50 min    Activity Tolerance Patient tolerated treatment well    Behavior During Therapy [facility] for tasks assessed/performed           Past Medical History:  Diagnosis Date  . Depression   . Diverticulosis   . Hyperlipidemia   . Hypertension   . Migraine     Past Surgical History:  Procedure Laterality Date  . CYSTOSCOPY WITH INSERTION OF UROLIFT N/A 04/28/2019   Procedure: CYSTOSCOPY WITH INSERTION OF UROLIFT;  Surgeon: Festus Aloe, MD;  Location: [facility];  Service: Urology;  Laterality: N/A;  . GANGLION CYST EXCISION    . PROSTATE BIOPSY N/A 04/28/2019   Procedure: BIOPSY TRANSRECTAL ULTRASONIC PROSTATE (TUBP);  Surgeon: Festus Aloe, MD;  Location: [facility];  Service: Urology;  Laterality: N/A;    There were no vitals filed for this visit.   Subjective Assessment - 10/09/19 0838    Subjective Pt referred to PT with history of Lt hip pain diagnosed as piriformis syndrome. Acute onset several months ago but now much better.  100% better.  It took a long time to get into the clinic for this but now it is better.    Pertinent History PSH: cystoscopy with UroLift for BPH, + prostate biopsy being monitored    Limitations Standing    How long can you stand comfortably? when it hurt, couldn't stand without pain    Currently  in Pain? No/denies              [facility] PT Assessment - 10/09/19 0001      Assessment   Medical Diagnosis G57.02 (ICD-10-CM) - Piriformis syndrome of left side    Referring Provider (PT) Glenis Smoker, MD    Onset Date/Surgical Date --   several months ago   Prior Therapy no      Precautions   Precautions None      Restrictions   Weight Bearing Restrictions No      Balance Screen   Has the patient fallen in the past 6 months No      [place] residence    Living Arrangements Spouse/significant other      Prior Function   Level of Independence Independent    Vocation Part time employment    Systems analyst, does weddings, 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    Leisure TV      Cognition   Overall Cognitive Status Within Functional Limits for tasks assessed      Observation/Other Assessments   Focus on Therapeutic Outcomes (FOTO)  deferred secondary to no pain      Posture/Postural Control   Posture/Postural Control Postural limitations    Postural Limitations Rounded Shoulders;Increased thoracic kyphosis;Forward head;Decreased lumbar lordosis      ROM /  Strength   AROM / PROM / Strength AROM;Strength;PROM      AROM   Overall AROM Comments trunk ROM limited 25% all planes without pain, poor segmental mobility of L-spine      PROM   Overall PROM Comments hip ROM WNL, knee extension limited secondary to signif tight hamstrings bil      Strength   Overall Strength Comments poor awareness of TrA (domes and strains for ab contraction), 5/5 bil LEs with exception of Lt hip    Strength Assessment Site Hip    Right/Left Hip Left    Left Hip External Rotation 4-/5    Left Hip ABduction 4/5      Flexibility   Soft Tissue Assessment /Muscle Length yes    Hamstrings limited bil 40%, neural tension on Lt    Piriformis Lt limited and painful 20%      Palpation   Spinal mobility poor gapping throughout lumbar, poor extension  throughout thoracic    Palpation comment Lt piriformis and Lt glut med tender      Special Tests    Special Tests Lumbar    Lumbar Tests Straight Leg Raise      Straight Leg Raise   Findings Positive    Side  Left    Comment for neural tension as R1 vs hamstring      High Level Balance   High Level Balance Comments Pt unable to balance on Lt SLS                         [facility] Adult PT Treatment/Exercise - 10/09/19 0001      Exercises   Exercises Knee/Hip;Lumbar      Lumbar Exercises: Stretches   Single Knee to Chest Stretch Left;Right;2 reps;20 seconds    Figure 4 Stretch 2 reps;20 seconds;Supine;With overpressure    Figure 4 Stretch Limitations Left      Knee/Hip Exercises: Stretches   Piriformis Stretch Left;2 reps;20 seconds            Trigger Point Dry Needling - 10/09/19 0001    Consent Given? Yes    Education Handout Provided Yes    Muscles Treated Back/Hip Gluteus medius;Piriformis    Dry Needling Comments Left    Gluteus Medius Response Twitch response elicited;Palpable increased muscle length    Piriformis Response Twitch response elicited;Palpable increased muscle length                  PT Short Term Goals - 10/09/19 1128      PT SHORT TERM GOAL #1   Title Ind with initial HEP    Time 3    Period Weeks    Status New    Target Date 10/30/19             PT Long Term Goals - 10/09/19 1128      PT LONG TERM GOAL #1   Title Pt will be ind with advanced HEP and walking for exercise at least 3x/week to improve overall mobility and strength for tolerance of daily tasks.    Time 12    Period Weeks    Status New    Target Date 01/01/20      PT LONG TERM GOAL #2   Title Bil LE flexibility to [facility] to reduce undue strain on spine and hips.    Time 12    Period Weeks    Status New    Target Date 01/01/20  PT LONG TERM GOAL #3   Title Lt hip ER and abd at least 4+/5 strength for improved stability and balance.    Time 12     Period Weeks    Status New    Target Date 01/01/20      PT LONG TERM GOAL #4   Title Improved trunk ROM WFL for improved bending and lifting tasks.    Time 12    Period Weeks    Status New    Target Date 01/01/20      PT LONG TERM GOAL #5   Title Pt will be able to perform 3 trials of Lt LE SLS balance for at least 10 sec without UE support to demo improved Lt hip stability.    Time 12    Period Weeks    Status New    Target Date 01/01/20                 Plan - 10/09/19 1119    Clinical Impression Statement Pt is a pleasant 74yo male with history of Lt hip pain several months ago that began acutely without incidence but has since resolved after doing a lot of walking.  Pt interested in participating in PT to reduce likelihood of this happening again.  PT evaluation revealed limited A/ROM of thoracic and lumbar spine without pain, hypomobile lumbar and thoracic joint mobility, weakness in core with poor awareness of abdominal stabilizers, weakness in Lt hip ER and abd, poor SLS balance on Lt LE compared to Rt, and thoracic kyphosis.  He has soft tissue TPs in Lt hip and signif tightness in bil hamstrings.  There is neural tension present in Lt LE creating pain with attempt to initiate hamstring stretching at eval today.  PT introduced neural glides in supine for Lt LE but this will need more reviewing before adding to HEP.  PT explained and performed DN to Lt hip with good twitch and release.  Pt was not sore afterwards.  Pt is an excellent candidate for PT to address mobility, flexibility and strength/balance of Lt hip to reduce likelihood of return of pain.    Personal Factors and Comorbidities Age;Profession   Education administrator, needs to stand for weddings   Examination-Activity Limitations Stand;Bend    Examination-Participation Restrictions Community Activity;Yard Work    Stability/Clinical Decision Making Stable/Uncomplicated    Designer, jewellery [employer]    Rehab  Potential Excellent    PT Frequency 1x / week    PT Duration 12 weeks    PT Treatment/Interventions ADLs/Self Care Home Management;Therapeutic exercise;Neuromuscular re-education;Balance training;Functional mobility training;Patient/family education;Dry needling;Passive range of motion;Spinal Manipulations;Joint Manipulations;Moist Heat    PT Next Visit Plan review HEP, f/u on DN to Lt hip, do lumbar DN, progress HEP to include spine ROM (cat/camel, open book, prayer, t-spine ext over chair, doorway stretch for pecs, Lt neural glides, add hamstring stretch as tol to HEP    PT Home Exercise Plan Access Code: [email or web address]    Consulted and Agree with Plan of Care Patient           Patient will benefit from skilled therapeutic intervention in order to improve the following deficits and impairments:  Decreased range of motion, Hypomobility, Impaired flexibility, Postural dysfunction, Decreased strength, Decreased mobility, Decreased balance  Visit Diagnosis: Pain in left hip - Plan: PT plan of care cert/re-cert  Abnormal posture - Plan: PT plan of care cert/re-cert  Muscle weakness (generalized) - Plan: PT plan of care cert/re-cert  Problem List Patient Active Problem List   Diagnosis Date Noted  . Bruxism 05/07/2017  . Laryngopharyngeal reflux (LPR) 05/07/2017  . Presbycusis of both ears 05/07/2017  . Hemorrhoids, external, thrombosed 02/13/2012    Baruch Merl, PT 10/09/19 11:35 AM   [facility] [address] [place], [place], [address] Phone: [phone]   Fax:  [phone]  Name: Wesley [name] MRN: [id number] Date of Birth: 1945-07-03

## 2019-10-12 DIAGNOSIS — F411 Generalized anxiety disorder: Secondary | ICD-10-CM | POA: Diagnosis not present

## 2019-10-12 DIAGNOSIS — F341 Dysthymic disorder: Secondary | ICD-10-CM | POA: Diagnosis not present

## 2019-10-15 ENCOUNTER — Other Ambulatory Visit: Payer: Self-pay

## 2019-10-15 ENCOUNTER — Ambulatory Visit: Payer: Medicare PPO

## 2019-10-15 DIAGNOSIS — M6281 Muscle weakness (generalized): Secondary | ICD-10-CM

## 2019-10-15 DIAGNOSIS — R293 Abnormal posture: Secondary | ICD-10-CM

## 2019-10-15 DIAGNOSIS — M25552 Pain in left hip: Secondary | ICD-10-CM | POA: Diagnosis not present

## 2019-10-15 NOTE — Patient Instructions (Signed)
Access Code: [email or web address] URL: [email or web address] Date: 10/15/2019 Prepared by: Claiborne Billings  Exercises  Seated Figure 4 Piriformis Stretch - 2 x daily - 7 x weekly - 1 sets - 3 reps - 20 hold Sidelying Open Book Thoracic Rotation with Knee on Foam Roll - 2 x daily - 7 x weekly - 1 sets - 10 reps Seated Hamstring Stretch - 2 x daily - 7 x weekly - 1 sets - 3 reps - 20 hold Clamshell - 1 x daily - 7 x weekly - 2 sets - 10 reps

## 2019-10-15 NOTE — Therapy (Signed)
[facility] [address] [place], [place], [address] Phone: [phone]   Fax:  [phone]  Physical Therapy Treatment  Patient Details  Name: Wesley [name] MRN: [id number] Date of Birth: [birth year] Referring Provider (PT): Glenis Smoker, MD   Encounter Date: 10/15/2019   PT End of Session - 10/15/19 0845    Visit Number 2    Date for PT Re-Evaluation 01/01/20    Authorization Type [employer] - submitting for auth 1x/week x 12 weeks    PT Start Time 0802    PT Stop Time 0844    PT Time Calculation (min) 42 min    Activity Tolerance Patient tolerated treatment well    Behavior During Therapy [facility] for tasks assessed/performed           Past Medical History:  Diagnosis Date  . Depression   . Diverticulosis   . Hyperlipidemia   . Hypertension   . Migraine     Past Surgical History:  Procedure Laterality Date  . CYSTOSCOPY WITH INSERTION OF UROLIFT N/A 04/28/2019   Procedure: CYSTOSCOPY WITH INSERTION OF UROLIFT;  Surgeon: Festus Aloe, MD;  Location: [facility];  Service: Urology;  Laterality: N/A;  . GANGLION CYST EXCISION    . PROSTATE BIOPSY N/A 04/28/2019   Procedure: BIOPSY TRANSRECTAL ULTRASONIC PROSTATE (TUBP);  Surgeon: Festus Aloe, MD;  Location[facility];  Service: Urology;  Laterality: N/A;    There were no vitals filed for this visit.   Subjective Assessment - 10/15/19 0805    Subjective I'm doing pretty good.  I wasn't sore after needling.    Currently in Pain? No/denies                             [facility] Adult PT Treatment/Exercise - 10/15/19 0001      Lumbar Exercises: Stretches   Active Hamstring Stretch Left;Right;3 reps;20 seconds    Single Knee to Chest Stretch Left;Right;2 reps;20 seconds    Figure 4 Stretch 2 reps;20 seconds;Supine;With overpressure    Figure 4 Stretch Limitations Bil- performed both  in sitting and supine 2 positions      Lumbar Exercises: Sidelying   Other Sidelying Lumbar Exercises open book x10 each      Knee/Hip Exercises: Sidelying   Clams 2x10 bil with bracing      Manual Therapy   Manual Therapy Soft tissue mobilization;Myofascial release    Manual therapy comments Lt gluteals in sidelying            Trigger Point Dry Needling - 10/15/19 0001    Consent Given? Yes    Education Handout Provided Previously provided    Muscles Treated Back/Hip Gluteus medius;Piriformis    Dry Needling Comments Left    Gluteus Medius Response Twitch response elicited;Palpable increased muscle length    Piriformis Response Twitch response elicited;Palpable increased muscle length                PT Education - 10/15/19 0816    Education Details Access Code: [email or web address]    Person(s) Educated Patient    Methods Explanation;Demonstration;Handout    Comprehension Verbalized understanding;Returned demonstration            PT Short Term Goals - 10/09/19 1128      PT SHORT TERM GOAL #1   Title Ind with initial HEP    Time 3    Period Weeks  Status New    Target Date 10/30/19             PT Long Term Goals - 10/09/19 1128      PT LONG TERM GOAL #1   Title Pt will be ind with advanced HEP and walking for exercise at least 3x/week to improve overall mobility and strength for tolerance of daily tasks.    Time 12    Period Weeks    Status New    Target Date 01/01/20      PT LONG TERM GOAL #2   Title Bil LE flexibility to [facility] to reduce undue strain on spine and hips.    Time 12    Period Weeks    Status New    Target Date 01/01/20      PT LONG TERM GOAL #3   Title Lt hip ER and abd at least 4+/5 strength for improved stability and balance.    Time 12    Period Weeks    Status New    Target Date 01/01/20      PT LONG TERM GOAL #4   Title Improved trunk ROM WFL for improved bending and lifting tasks.    Time 12    Period Weeks    Status New     Target Date 01/01/20      PT LONG TERM GOAL #5   Title Pt will be able to perform 3 trials of Lt LE SLS balance for at least 10 sec without UE support to demo improved Lt hip stability.    Time 12    Period Weeks    Status New    Target Date 01/01/20                 Plan - 10/15/19 0831    Clinical Impression Statement Pt with first time follow-up today.  Pt performed all initial HEP correctly and PT added exercises for gluteal strength, additional position for piriformis stretch and added hamstring stretch.  Pt with tension and trigger points in Lt gluteals and demonstrated improved tissue mobility after manual therapy and dry needling.  Pt without pain for the past 2 weeks.  Pt will continue to benefit from skilled PT to address Lt gluteal tension, weakness and flexibility.    PT Frequency 1x / week    PT Duration 12 weeks    PT Treatment/Interventions ADLs/Self Care Home Management;Therapeutic exercise;Neuromuscular re-education;Balance training;Functional mobility training;Patient/family education;Dry needling;Passive range of motion;Spinal Manipulations;Joint Manipulations;Moist Heat    PT Next Visit Plan review new HEP, more thoracic mobility, SLS on the Lt, Lt neural glides, DN again if helpful    PT Home Exercise Plan Access Code: [email or web address]    Recommended Other Services initial cert is signed    Consulted and Agree with Plan of Care Patient           Patient will benefit from skilled therapeutic intervention in order to improve the following deficits and impairments:  Decreased range of motion, Hypomobility, Impaired flexibility, Postural dysfunction, Decreased strength, Decreased mobility, Decreased balance  Visit Diagnosis: Abnormal posture  Pain in left hip  Muscle weakness (generalized)     Problem List Patient Active Problem List   Diagnosis Date Noted  . Bruxism 05/07/2017  . Laryngopharyngeal reflux (LPR) 05/07/2017  . Presbycusis of both ears 05/07/2017   . Hemorrhoids, external, thrombosed 02/13/2012     Sigurd Sos, PT 10/15/19 8:47 AM  [facility] [address] [place],  [place], [address] Phone: [phone]   Fax:  [phone]  Name: Wesley [name] MRN: [id number] Date of Birth: [birth year]

## 2019-10-20 ENCOUNTER — Ambulatory Visit: Payer: Medicare PPO

## 2019-10-20 ENCOUNTER — Other Ambulatory Visit: Payer: Self-pay

## 2019-10-20 DIAGNOSIS — M25552 Pain in left hip: Secondary | ICD-10-CM | POA: Diagnosis not present

## 2019-10-20 DIAGNOSIS — R293 Abnormal posture: Secondary | ICD-10-CM

## 2019-10-20 DIAGNOSIS — M6281 Muscle weakness (generalized): Secondary | ICD-10-CM

## 2019-10-20 NOTE — Therapy (Addendum)
[facility] [address] [place], [place], [address] Phone: [phone]   Fax:  [phone]  Physical Therapy Treatment  Patient Details  Name: Wesley [name] MRN: [id number] Date of Birth: [birth year] Referring Provider (PT): Glenis Smoker, MD   Encounter Date: 10/20/2019   PT End of Session - 10/20/19 0839    Visit Number 3    Date for PT Re-Evaluation 01/01/20    Authorization Type [employer] - submitting for auth 1x/week x 12 weeks    PT Start Time 0800   dry needling   PT Stop Time 0838    PT Time Calculation (min) 38 min    Activity Tolerance Patient tolerated treatment well    Behavior During Therapy [facility] for tasks assessed/performed           Past Medical History:  Diagnosis Date  . Depression   . Diverticulosis   . Hyperlipidemia   . Hypertension   . Migraine     Past Surgical History:  Procedure Laterality Date  . CYSTOSCOPY WITH INSERTION OF UROLIFT N/A 04/28/2019   Procedure: CYSTOSCOPY WITH INSERTION OF UROLIFT;  Surgeon: Festus Aloe, MD;  Location: [facility];  Service: Urology;  Laterality: N/A;  . GANGLION CYST EXCISION    . PROSTATE BIOPSY N/A 04/28/2019   Procedure: BIOPSY TRANSRECTAL ULTRASONIC PROSTATE (TUBP);  Surgeon: Festus Aloe, MD;  Location: [facility];  Service: Urology;  Laterality: N/A;    There were no vitals filed for this visit.   Subjective Assessment - 10/20/19 0759    Subjective I haven't had any pain.  I don't think I will need much more PT.    Currently in Pain? No/denies              [facility] PT Assessment - 10/20/19 0001      Assessment   Medical Diagnosis G57.02 (ICD-10-CM) - Piriformis syndrome of left side    Referring Provider (PT) Glenis Smoker, MD      AROM   Overall AROM Comments full lumbar A/ROM with limited segmental mobility in the lumbar spine      Strength   Left Hip  ABduction 4+/5      High Level Balance   High Level Balance Comments Lt SLS: 5-7 seconds max on LT                         [facility] Adult PT Treatment/Exercise - 10/20/19 0001      Lumbar Exercises: Stretches   Active Hamstring Stretch Left;Right;3 reps;20 seconds    Figure 4 Stretch 2 reps;20 seconds;Supine;With overpressure    Figure 4 Stretch Limitations sitting      Lumbar Exercises: Sidelying   Other Sidelying Lumbar Exercises open book x10 each      Knee/Hip Exercises: Sidelying   Clams 2x10 bil with bracing      Manual Therapy   Manual Therapy Soft tissue mobilization;Myofascial release    Manual therapy comments Lt gluteals in sidelying            Trigger Point Dry Needling - 10/20/19 0001    Consent Given? Yes    Education Handout Provided Previously provided    Muscles Treated Back/Hip Gluteus medius;Piriformis    Dry Needling Comments Left    Gluteus Medius Response Twitch response elicited;Palpable increased muscle length    Piriformis Response Twitch response elicited;Palpable increased muscle length  PT Short Term Goals - 10/09/19 1128      PT SHORT TERM GOAL #1   Title Ind with initial HEP    Time 3    Period Weeks    Status New    Target Date 10/30/19             PT Long Term Goals - 10/20/19 0813      PT LONG TERM GOAL #1   Title Pt will be ind with advanced HEP and walking for exercise at least 3x/week to improve overall mobility and strength for tolerance of daily tasks.    Status Achieved      PT LONG TERM GOAL #3   Title Lt hip ER and abd at least 4+/5 strength for improved stability and balance.    Status On-going      PT LONG TERM GOAL #4   Title Improved trunk ROM WFL for improved bending and lifting tasks.    Status Achieved      PT LONG TERM GOAL #5   Title Pt will be able to perform 3 trials of Lt LE SLS balance for at least 10 sec without UE support to demo improved Lt hip stability.     Baseline 1 rep of 5-7 seconds    Status On-going                 Plan - 10/20/19 0811    Clinical Impression Statement Pt performed all initial HEP correctly and PT added exercises for gluteal strength, additional position for piriformis stretch and added hamstring stretch.  Pt with tension and trigger points in Lt gluteals and demonstrated improved tissue mobility after manual therapy and dry needling.  Pt without pain for the past 3 weeks and has been consistent with regular walking without difficulty.  Pt will be placed on hold due to no pain and will return in 3-4 weeks if needed.  He will cancel if not needed.    PT Frequency 1x / week    PT Duration 12 weeks    PT Treatment/Interventions ADLs/Self Care Home Management;Therapeutic exercise;Neuromuscular re-education;Balance training;Functional mobility training;Patient/family education;Dry needling;Passive range of motion;Spinal Manipulations;Joint Manipulations;Moist Heat    PT Next Visit Plan place on hold x 3-4 weeks.  Pt will return as needed    PT Home Exercise Plan Access Code: [email or web address]    Consulted and Agree with Plan of Care Patient           Patient will benefit from skilled therapeutic intervention in order to improve the following deficits and impairments:  Decreased range of motion, Hypomobility, Impaired flexibility, Postural dysfunction, Decreased strength, Decreased mobility, Decreased balance  Visit Diagnosis: Abnormal posture  Pain in left hip  Muscle weakness (generalized)     Problem List Patient Active Problem List   Diagnosis Date Noted  . Bruxism 05/07/2017  . Laryngopharyngeal reflux (LPR) 05/07/2017  . Presbycusis of both ears 05/07/2017  . Hemorrhoids, external, thrombosed 02/13/2012    Sigurd Sos, PT 10/20/19 8:41 AM PHYSICAL THERAPY DISCHARGE SUMMARY  Visits from Start of Care: 3  Current functional level related to goals / functional outcomes: See above for current status.  Pt  didn't return after last visit.     Remaining deficits: See above.     Education / Equipment: HEP Plan: Patient agrees to discharge.  Patient goals were partially met. Patient is being discharged due to not returning since the last visit.  ?????        Sigurd Sos, PT  12/08/19 10:29 AM  [facility] [address], [place], [place], [address] Phone: [phone]   Fax:  [phone]  Name: Wesley [name] MRN: [id number] Date of Birth: October 03, 1945

## 2019-11-02 DIAGNOSIS — F411 Generalized anxiety disorder: Secondary | ICD-10-CM | POA: Diagnosis not present

## 2019-11-02 DIAGNOSIS — F341 Dysthymic disorder: Secondary | ICD-10-CM | POA: Diagnosis not present

## 2019-11-16 ENCOUNTER — Ambulatory Visit: Payer: Medicare PPO | Admitting: Physical Therapy

## 2019-11-23 ENCOUNTER — Encounter: Payer: Medicare PPO | Admitting: Physical Therapy

## 2019-11-30 ENCOUNTER — Encounter: Payer: Medicare PPO | Admitting: Physical Therapy

## 2019-12-14 ENCOUNTER — Encounter: Payer: Medicare PPO | Admitting: Physical Therapy

## 2019-12-21 ENCOUNTER — Encounter: Payer: Medicare PPO | Admitting: Physical Therapy

## 2019-12-28 ENCOUNTER — Encounter: Payer: Medicare PPO | Admitting: Physical Therapy

## 2020-02-29 DIAGNOSIS — D225 Melanocytic nevi of trunk: Secondary | ICD-10-CM | POA: Diagnosis not present

## 2020-02-29 DIAGNOSIS — L814 Other melanin hyperpigmentation: Secondary | ICD-10-CM | POA: Diagnosis not present

## 2020-02-29 DIAGNOSIS — L570 Actinic keratosis: Secondary | ICD-10-CM | POA: Diagnosis not present

## 2020-02-29 DIAGNOSIS — L821 Other seborrheic keratosis: Secondary | ICD-10-CM | POA: Diagnosis not present

## 2020-03-10 DIAGNOSIS — B009 Herpesviral infection, unspecified: Secondary | ICD-10-CM | POA: Diagnosis not present

## 2020-03-10 DIAGNOSIS — F330 Major depressive disorder, recurrent, mild: Secondary | ICD-10-CM | POA: Diagnosis not present

## 2020-03-10 DIAGNOSIS — Z0000 Encounter for general adult medical examination without abnormal findings: Secondary | ICD-10-CM | POA: Diagnosis not present

## 2020-03-10 DIAGNOSIS — E7800 Pure hypercholesterolemia, unspecified: Secondary | ICD-10-CM | POA: Diagnosis not present

## 2020-03-10 DIAGNOSIS — I10 Essential (primary) hypertension: Secondary | ICD-10-CM | POA: Diagnosis not present

## 2020-03-10 DIAGNOSIS — R7303 Prediabetes: Secondary | ICD-10-CM | POA: Diagnosis not present

## 2020-03-10 DIAGNOSIS — N400 Enlarged prostate without lower urinary tract symptoms: Secondary | ICD-10-CM | POA: Diagnosis not present

## 2020-06-01 DIAGNOSIS — H5203 Hypermetropia, bilateral: Secondary | ICD-10-CM | POA: Diagnosis not present

## 2020-06-01 DIAGNOSIS — H2513 Age-related nuclear cataract, bilateral: Secondary | ICD-10-CM | POA: Diagnosis not present

## 2020-06-01 DIAGNOSIS — H524 Presbyopia: Secondary | ICD-10-CM | POA: Diagnosis not present

## 2020-08-08 DIAGNOSIS — L820 Inflamed seborrheic keratosis: Secondary | ICD-10-CM | POA: Diagnosis not present

## 2020-08-08 DIAGNOSIS — D485 Neoplasm of uncertain behavior of skin: Secondary | ICD-10-CM | POA: Diagnosis not present

## 2020-08-29 DIAGNOSIS — H903 Sensorineural hearing loss, bilateral: Secondary | ICD-10-CM | POA: Diagnosis not present

## 2020-09-06 DIAGNOSIS — R7303 Prediabetes: Secondary | ICD-10-CM | POA: Diagnosis not present

## 2020-09-06 DIAGNOSIS — I10 Essential (primary) hypertension: Secondary | ICD-10-CM | POA: Diagnosis not present

## 2020-09-06 DIAGNOSIS — E7800 Pure hypercholesterolemia, unspecified: Secondary | ICD-10-CM | POA: Diagnosis not present

## 2020-09-06 DIAGNOSIS — F330 Major depressive disorder, recurrent, mild: Secondary | ICD-10-CM | POA: Diagnosis not present

## 2020-09-06 DIAGNOSIS — Z23 Encounter for immunization: Secondary | ICD-10-CM | POA: Diagnosis not present

## 2020-10-19 DIAGNOSIS — M170 Bilateral primary osteoarthritis of knee: Secondary | ICD-10-CM | POA: Diagnosis not present

## 2020-10-20 DIAGNOSIS — N5201 Erectile dysfunction due to arterial insufficiency: Secondary | ICD-10-CM | POA: Diagnosis not present

## 2020-10-20 DIAGNOSIS — R3915 Urgency of urination: Secondary | ICD-10-CM | POA: Diagnosis not present

## 2020-10-20 DIAGNOSIS — C61 Malignant neoplasm of prostate: Secondary | ICD-10-CM | POA: Diagnosis not present

## 2020-10-20 DIAGNOSIS — N401 Enlarged prostate with lower urinary tract symptoms: Secondary | ICD-10-CM | POA: Diagnosis not present

## 2020-11-18 DIAGNOSIS — R051 Acute cough: Secondary | ICD-10-CM | POA: Diagnosis not present

## 2020-11-18 DIAGNOSIS — U071 COVID-19: Secondary | ICD-10-CM | POA: Diagnosis not present

## 2020-12-18 ENCOUNTER — Emergency Department (HOSPITAL_COMMUNITY): Payer: Medicare PPO

## 2020-12-18 ENCOUNTER — Encounter (HOSPITAL_COMMUNITY): Payer: Self-pay

## 2020-12-18 ENCOUNTER — Inpatient Hospital Stay (HOSPITAL_COMMUNITY)
Admission: EM | Admit: 2020-12-18 | Discharge: 2020-12-21 | DRG: 392 | Disposition: A | Discharge status: Home / Self Care | Payer: Medicare PPO | Attending: General Surgery | Admitting: General Surgery

## 2020-12-18 ENCOUNTER — Other Ambulatory Visit: Payer: Self-pay

## 2020-12-18 DIAGNOSIS — Z720 Tobacco use: Secondary | ICD-10-CM | POA: Diagnosis not present

## 2020-12-18 DIAGNOSIS — R109 Unspecified abdominal pain: Secondary | ICD-10-CM | POA: Diagnosis present

## 2020-12-18 DIAGNOSIS — W19XXXA Unspecified fall, initial encounter: Secondary | ICD-10-CM | POA: Diagnosis present

## 2020-12-18 DIAGNOSIS — K8020 Calculus of gallbladder without cholecystitis without obstruction: Secondary | ICD-10-CM | POA: Diagnosis not present

## 2020-12-18 DIAGNOSIS — K5720 Diverticulitis of large intestine with perforation and abscess without bleeding: Secondary | ICD-10-CM | POA: Diagnosis not present

## 2020-12-18 DIAGNOSIS — I10 Essential (primary) hypertension: Secondary | ICD-10-CM | POA: Diagnosis not present

## 2020-12-18 DIAGNOSIS — E785 Hyperlipidemia, unspecified: Secondary | ICD-10-CM | POA: Diagnosis not present

## 2020-12-18 DIAGNOSIS — K529 Noninfective gastroenteritis and colitis, unspecified: Secondary | ICD-10-CM | POA: Diagnosis not present

## 2020-12-18 DIAGNOSIS — Z880 Allergy status to penicillin: Secondary | ICD-10-CM

## 2020-12-18 DIAGNOSIS — Z888 Allergy status to other drugs, medicaments and biological substances status: Secondary | ICD-10-CM | POA: Diagnosis not present

## 2020-12-18 DIAGNOSIS — Z79899 Other long term (current) drug therapy: Secondary | ICD-10-CM

## 2020-12-18 DIAGNOSIS — Z91041 Radiographic dye allergy status: Secondary | ICD-10-CM

## 2020-12-18 DIAGNOSIS — Z8249 Family history of ischemic heart disease and other diseases of the circulatory system: Secondary | ICD-10-CM

## 2020-12-18 DIAGNOSIS — M419 Scoliosis, unspecified: Secondary | ICD-10-CM | POA: Diagnosis not present

## 2020-12-18 DIAGNOSIS — Z20822 Contact with and (suspected) exposure to covid-19: Secondary | ICD-10-CM | POA: Diagnosis not present

## 2020-12-18 DIAGNOSIS — F32A Depression, unspecified: Secondary | ICD-10-CM | POA: Diagnosis not present

## 2020-12-18 DIAGNOSIS — K631 Perforation of intestine (nontraumatic): Secondary | ICD-10-CM | POA: Diagnosis not present

## 2020-12-18 LAB — CBC WITH DIFFERENTIAL/PLATELET
Abs Immature Granulocytes: 0.13 10*3/uL — ABNORMAL HIGH (ref 0.00–0.07)
Basophils Absolute: 0.1 10*3/uL (ref 0.0–0.1)
Basophils Relative: 0 %
Eosinophils Absolute: 0 10*3/uL (ref 0.0–0.5)
Eosinophils Relative: 0 %
HCT: 45.9 % (ref 39.0–52.0)
Hemoglobin: 15.2 g/dL (ref 13.0–17.0)
Immature Granulocytes: 1 %
Lymphocytes Relative: 6 %
Lymphs Abs: 0.9 10*3/uL (ref 0.7–4.0)
MCH: 30.4 pg (ref 26.0–34.0)
MCHC: 33.1 g/dL (ref 30.0–36.0)
MCV: 91.8 fL (ref 80.0–100.0)
Monocytes Absolute: 1 10*3/uL (ref 0.1–1.0)
Monocytes Relative: 7 %
Neutro Abs: 12.5 10*3/uL — ABNORMAL HIGH (ref 1.7–7.7)
Neutrophils Relative %: 86 %
Platelets: 362 10*3/uL (ref 150–400)
RBC: 5 MIL/uL (ref 4.22–5.81)
RDW: 13.8 % (ref 11.5–15.5)
WBC: 14.6 10*3/uL — ABNORMAL HIGH (ref 4.0–10.5)
nRBC: 0 % (ref 0.0–0.2)

## 2020-12-18 LAB — COMPREHENSIVE METABOLIC PANEL WITH GFR
ALT: 40 U/L (ref 0–44)
AST: 30 U/L (ref 15–41)
Albumin: 3.6 g/dL (ref 3.5–5.0)
Alkaline Phosphatase: 96 U/L (ref 38–126)
Anion gap: 11 (ref 5–15)
BUN: 24 mg/dL — ABNORMAL HIGH (ref 8–23)
CO2: 22 mmol/L (ref 22–32)
Calcium: 9.1 mg/dL (ref 8.9–10.3)
Chloride: 102 mmol/L (ref 98–111)
Creatinine, Ser: 1.02 mg/dL (ref 0.61–1.24)
GFR, Estimated: >60 mL/min
Glucose, Bld: 128 mg/dL — ABNORMAL HIGH (ref 70–99)
Potassium: 3.7 mmol/L (ref 3.5–5.1)
Sodium: 135 mmol/L (ref 135–145)
Total Bilirubin: 0.8 mg/dL (ref 0.3–1.2)
Total Protein: 7.7 g/dL (ref 6.5–8.1)

## 2020-12-18 LAB — LIPASE, BLOOD: Lipase: 36 U/L (ref 11–51)

## 2020-12-18 LAB — URINALYSIS, ROUTINE W REFLEX MICROSCOPIC
Bilirubin Urine: NEGATIVE
Bilirubin Urine: NEGATIVE
Glucose, UA: NEGATIVE mg/dL
Glucose, UA: NEGATIVE mg/dL
Hgb urine dipstick: NEGATIVE
Hgb urine dipstick: NEGATIVE
Ketones, ur: 5 mg/dL — AB
Ketones, ur: NEGATIVE mg/dL
Leukocytes,Ua: NEGATIVE
Leukocytes,Ua: NEGATIVE
Nitrite: NEGATIVE
Nitrite: NEGATIVE
Protein, ur: NEGATIVE mg/dL
Protein, ur: NEGATIVE mg/dL
Specific Gravity, Urine: 1.02 (ref 1.005–1.030)
Specific Gravity, Urine: 1.013 (ref 1.005–1.030)
pH: 5 (ref 5.0–8.0)
pH: 6 (ref 5.0–8.0)

## 2020-12-18 LAB — RESP PANEL BY RT-PCR (FLU A&B, COVID) ARPGX2
Influenza A by PCR: NEGATIVE
Influenza B by PCR: NEGATIVE
SARS Coronavirus 2 by RT PCR: NEGATIVE

## 2020-12-18 MED ORDER — PIPERACILLIN-TAZOBACTAM 3.375 G IVPB
3.3750 g | Freq: Three times a day (TID) | INTRAVENOUS | Status: DC
  Administered 2020-12-18 – 2020-12-21 (×10): 3.375 g via INTRAVENOUS
  Filled 2020-12-18 (×11): qty 50

## 2020-12-18 MED ORDER — SODIUM CHLORIDE 0.9 % IV SOLN: INTRAVENOUS | Status: DC

## 2020-12-18 MED ORDER — BUPROPION HCL ER (XL) 150 MG PO TB24
300.0000 mg | ORAL_TABLET | Freq: Every day | ORAL | Status: DC
  Administered 2020-12-18 – 2020-12-21 (×4): 300 mg via ORAL
  Filled 2020-12-18 (×4): qty 2

## 2020-12-18 MED ORDER — ONDANSETRON HCL 4 MG/2ML IJ SOLN
4.0000 mg | Freq: Four times a day (QID) | INTRAMUSCULAR | Status: DC | PRN
  Administered 2020-12-18: 11:00:00 4 mg via INTRAVENOUS
  Filled 2020-12-18: qty 2

## 2020-12-18 MED ORDER — TAMSULOSIN HCL 0.4 MG PO CAPS
0.4000 mg | ORAL_CAPSULE | Freq: Every day | ORAL | Status: DC
  Administered 2020-12-18 – 2020-12-21 (×4): 0.4 mg via ORAL
  Filled 2020-12-18 (×4): qty 1

## 2020-12-18 MED ORDER — SODIUM CHLORIDE 0.9 % IV SOLN: INTRAVENOUS | Status: DC | PRN

## 2020-12-18 MED ORDER — FLUTICASONE PROPIONATE 50 MCG/ACT NA SUSP
2.0000 | Freq: Every day | NASAL | Status: DC | PRN
  Filled 2020-12-18: qty 16

## 2020-12-18 MED ORDER — SIMETHICONE 80 MG PO CHEW
40.0000 mg | CHEWABLE_TABLET | Freq: Four times a day (QID) | ORAL | Status: DC | PRN
  Administered 2020-12-18 – 2020-12-19 (×2): 40 mg via ORAL
  Filled 2020-12-18 (×3): qty 1

## 2020-12-18 MED ORDER — ACETAMINOPHEN 650 MG RE SUPP: 650.0000 mg | Freq: Four times a day (QID) | RECTAL | Status: DC | PRN

## 2020-12-18 MED ORDER — METHOCARBAMOL 500 MG PO TABS
500.0000 mg | ORAL_TABLET | Freq: Four times a day (QID) | ORAL | Status: DC | PRN
  Administered 2020-12-18 (×2): 500 mg via ORAL
  Filled 2020-12-18 (×2): qty 1

## 2020-12-18 MED ORDER — ENOXAPARIN SODIUM 40 MG/0.4ML IJ SOSY
40.0000 mg | PREFILLED_SYRINGE | INTRAMUSCULAR | Status: DC
  Administered 2020-12-18 – 2020-12-21 (×4): 40 mg via SUBCUTANEOUS
  Filled 2020-12-18 (×4): qty 0.4

## 2020-12-18 MED ORDER — METRONIDAZOLE 500 MG/100ML IV SOLN
500.0000 mg | Freq: Two times a day (BID) | INTRAVENOUS | Status: DC
  Filled 2020-12-18: qty 100

## 2020-12-18 MED ORDER — HYDROCHLOROTHIAZIDE 12.5 MG PO CAPS
12.5000 mg | ORAL_CAPSULE | Freq: Every day | ORAL | Status: DC
  Filled 2020-12-18 (×2): qty 1

## 2020-12-18 MED ORDER — DOCUSATE SODIUM 100 MG PO CAPS
100.0000 mg | ORAL_CAPSULE | Freq: Two times a day (BID) | ORAL | Status: DC
  Administered 2020-12-18 – 2020-12-20 (×4): 100 mg via ORAL
  Filled 2020-12-18 (×4): qty 1

## 2020-12-18 MED ORDER — HYDROMORPHONE HCL 1 MG/ML IJ SOLN
1.0000 mg | Freq: Once | INTRAMUSCULAR | Status: AC
  Administered 2020-12-18: 06:00:00 1 mg via INTRAVENOUS
  Filled 2020-12-18: qty 1

## 2020-12-18 MED ORDER — HYDROCHLOROTHIAZIDE 12.5 MG PO TABS
12.5000 mg | ORAL_TABLET | Freq: Every day | ORAL | Status: DC
  Administered 2020-12-18 – 2020-12-21 (×4): 12.5 mg via ORAL
  Filled 2020-12-18 (×4): qty 1

## 2020-12-18 MED ORDER — AMLODIPINE BESYLATE 5 MG PO TABS
10.0000 mg | ORAL_TABLET | Freq: Every day | ORAL | Status: DC
  Administered 2020-12-18 – 2020-12-21 (×4): 10 mg via ORAL
  Filled 2020-12-18 (×4): qty 2

## 2020-12-18 MED ORDER — ONDANSETRON 4 MG PO TBDP: 4.0000 mg | ORAL_TABLET | Freq: Four times a day (QID) | ORAL | Status: DC | PRN

## 2020-12-18 MED ORDER — HYDROMORPHONE HCL 1 MG/ML IJ SOLN
1.0000 mg | INTRAMUSCULAR | Status: DC | PRN
  Administered 2020-12-18: 09:00:00 1 mg via INTRAVENOUS
  Filled 2020-12-18: qty 1

## 2020-12-18 MED ORDER — ACETAMINOPHEN 325 MG PO TABS
650.0000 mg | ORAL_TABLET | Freq: Four times a day (QID) | ORAL | Status: DC | PRN
  Administered 2020-12-18 (×2): 650 mg via ORAL
  Filled 2020-12-18 (×2): qty 2

## 2020-12-18 MED ORDER — CIPROFLOXACIN IN D5W 400 MG/200ML IV SOLN
400.0000 mg | Freq: Once | INTRAVENOUS | Status: DC
  Filled 2020-12-18: qty 200

## 2020-12-18 MED ORDER — MORPHINE SULFATE (PF) 4 MG/ML IV SOLN
4.0000 mg | Freq: Once | INTRAVENOUS | Status: AC
  Administered 2020-12-18: 06:00:00 4 mg via INTRAVENOUS
  Filled 2020-12-18: qty 1

## 2020-12-18 MED ORDER — ONDANSETRON HCL 4 MG/2ML IJ SOLN
4.0000 mg | Freq: Once | INTRAMUSCULAR | Status: AC
  Administered 2020-12-18: 06:00:00 4 mg via INTRAVENOUS
  Filled 2020-12-18: qty 2

## 2020-12-18 MED ORDER — SODIUM CHLORIDE 0.9 % IV SOLN
1.0000 g | Freq: Once | INTRAVENOUS | Status: AC
  Administered 2020-12-18: 06:00:00 1000 mg via INTRAVENOUS
  Filled 2020-12-18: qty 1

## 2020-12-18 NOTE — ED Triage Notes (Signed)
Patient arrives from home with complaint of abdominal pain and bloating that began around 0130. Denies N/V/D

## 2020-12-18 NOTE — H&P (Addendum)
Wesley [name] is an 75 y.o. male.   Chief Complaint: ab pain HPI: 78 yom with no prior history diverticulitis (states has had colonoscopy but overdue) who had a fall a week ago and has been on robaxin, steroids since then.  He had acute onset of diffuse ab pain last night. Had small bm yesterday, has passed some flatus in er. No n/v. Nothing was doing at home was helping.   Has had covid recently after [place] trip. He underwent evaluation and has elevated wbc and ct consistent with perforated diverticulitis and I was asked to see him  Past Medical History:  Diagnosis Date   Depression    Diverticulosis    Hyperlipidemia    Hypertension    Migraine     Past Surgical History:  Procedure Laterality Date   CYSTOSCOPY WITH INSERTION OF UROLIFT N/A 04/28/2019   Procedure: CYSTOSCOPY WITH INSERTION OF UROLIFT;  Surgeon: Festus Aloe, MD;  Location: [facility];  Service: Urology;  Laterality: N/A;   GANGLION CYST EXCISION     PROSTATE BIOPSY N/A 04/28/2019   Procedure: BIOPSY TRANSRECTAL ULTRASONIC PROSTATE (TUBP);  Surgeon: Festus Aloe, MD;  Location: [facility];  Service: Urology;  Laterality: N/A;    Family History  Problem Relation Age of Onset   Heart disease Mother    Pneumonia Father    Social History:  reports that he has never smoked. He has quit using smokeless tobacco.  His smokeless tobacco use included chew. He reports current alcohol use of about 1.0 standard drink per week. He reports that he does not use drugs.  Allergies:  Allergies  Allergen Reactions   Iohexol      Code: HIVES, Desc: PT DEVELOPED HIVES DURING IV ADMINISTRATION/    Ivp Dye [Iodinated Diagnostic Agents] Hives   Other Hives   Penicillins    Percocet [Oxycodone-Acetaminophen] Itching   Zetia [Ezetimibe] Nausea And Vomiting    No current facility-administered medications on file prior to encounter.   Current Outpatient Medications on File Prior to  Encounter  Medication Sig Dispense Refill   amLODipine (NORVASC) 10 MG tablet Take 10 mg by mouth daily.     atorvastatin (LIPITOR) 10 MG tablet Take 10 mg by mouth daily.     buPROPion (WELLBUTRIN XL) 150 MG 24 hr tablet Take 300 mg by mouth daily.      ciprofloxacin (CIPRO) 500 MG tablet Take 1 tablet (500 mg total) by mouth 2 (two) times daily. 6 tablet 0   fluticasone (FLONASE) 50 MCG/ACT nasal spray Place 2 sprays into the nose daily as needed for allergies.      hydrochlorothiazide (MICROZIDE) 12.5 MG capsule Take 12.5 mg by mouth daily.     tamsulosin (FLOMAX) 0.4 MG CAPS capsule Take 0.4 mg by mouth daily after breakfast.     valACYclovir (VALTREX) 1000 MG tablet Take 1,000 mg by mouth every 12 (twelve) hours as needed (for flare up).         Results for orders placed or performed during the hospital encounter of 12/18/20 (from the past 48 hour(s))  CBC with Differential     Status: Abnormal   Collection Time: 12/18/20  3:06 AM  Result Value Ref Range   WBC 14.6 (H) 4.0 - 10.5 K/uL   RBC 5.00 4.22 - 5.81 MIL/uL   Hemoglobin 15.2 13.0 - 17.0 g/dL   HCT 45.9 39.0 - 52.0 %   MCV 91.8 80.0 - 100.0 fL   MCH 30.4 26.0 -  34.0 pg   MCHC 33.1 30.0 - 36.0 g/dL   RDW 13.8 11.5 - 15.5 %   Platelets 362 150 - 400 K/uL   nRBC 0.0 0.0 - 0.2 %   Neutrophils Relative % 86 %   Neutro Abs 12.5 (H) 1.7 - 7.7 K/uL   Lymphocytes Relative 6 %   Lymphs Abs 0.9 0.7 - 4.0 K/uL   Monocytes Relative 7 %   Monocytes Absolute 1.0 0.1 - 1.0 K/uL   Eosinophils Relative 0 %   Eosinophils Absolute 0.0 0.0 - 0.5 K/uL   Basophils Relative 0 %   Basophils Absolute 0.1 0.0 - 0.1 K/uL   Immature Granulocytes 1 %   Abs Immature Granulocytes 0.13 (H) 0.00 - 0.07 K/uL    Comment: Performed at [facility], [place] [address], [place], [place] [address]  Comprehensive metabolic panel     Status: Abnormal   Collection Time: 12/18/20  3:06 AM  Result Value Ref Range   Sodium 135 135 - 145  mmol/L   Potassium 3.7 3.5 - 5.1 mmol/L   Chloride 102 98 - 111 mmol/L   CO2 22 22 - 32 mmol/L   Glucose, Bld 128 (H) 70 - 99 mg/dL    Comment: Glucose reference range applies only to samples taken after fasting for at least 8 hours.   BUN 24 (H) 8 - 23 mg/dL   Creatinine, Ser 1.02 0.61 - 1.24 mg/dL   Calcium 9.1 8.9 - 10.3 mg/dL   Total Protein 7.7 6.5 - 8.1 g/dL   Albumin 3.6 3.5 - 5.0 g/dL   AST 30 15 - 41 U/L   ALT 40 0 - 44 U/L   Alkaline Phosphatase 96 38 - 126 U/L   Total Bilirubin 0.8 0.3 - 1.2 mg/dL   GFR, Estimated >60 >60 mL/min    Comment: (NOTE) Calculated using the CKD-EPI Creatinine Equation (2021)    Anion gap 11 5 - 15    Comment: Performed at [facility], [place] [address], [place], [place] [address]  Lipase, blood     Status: None   Collection Time: 12/18/20  3:06 AM  Result Value Ref Range   Lipase 36 11 - 51 U/L    Comment: Performed at [facility], [place] [address], [place], [place] [address]  Urinalysis, Routine w reflex microscopic Urine, Clean Catch     Status: Abnormal   Collection Time: 12/18/20  3:10 AM  Result Value Ref Range   Color, Urine YELLOW YELLOW   APPearance CLEAR CLEAR   Specific Gravity, Urine 1.020 1.005 - 1.030   pH 5.0 5.0 - 8.0   Glucose, UA NEGATIVE NEGATIVE mg/dL   Hgb urine dipstick NEGATIVE NEGATIVE   Bilirubin Urine NEGATIVE NEGATIVE   Ketones, ur 5 (A) NEGATIVE mg/dL   Protein, ur NEGATIVE NEGATIVE mg/dL   Nitrite NEGATIVE NEGATIVE   Leukocytes,Ua NEGATIVE NEGATIVE    Comment: Performed at [facility], [place] [address], [place], [place] [address]  Resp Panel by RT-PCR (Flu A&B, Covid) Nasopharyngeal Swab     Status: None   Collection Time: 12/18/20  5:23 AM   Specimen: Nasopharyngeal Swab; Nasopharyngeal(NP) swabs in vial transport medium  Result Value Ref Range   SARS Coronavirus 2 by RT PCR NEGATIVE NEGATIVE    Comment: (NOTE) SARS-CoV-2 target nucleic acids are NOT  DETECTED.  The SARS-CoV-2 RNA is generally detectable in upper respiratory specimens during the acute phase of infection. The lowest concentration of  SARS-CoV-2 viral copies this assay can detect is 138 copies/mL. A negative result does not preclude SARS-Cov-2 infection and should not be used as the sole basis for treatment or other patient management decisions. A negative result may occur with  improper specimen collection/handling, submission of specimen other than nasopharyngeal swab, presence of viral mutation(s) within the areas targeted by this assay, and inadequate number of viral copies(<138 copies/mL). A negative result must be combined with clinical observations, patient history, and epidemiological information. The expected result is Negative.  Fact Sheet for Patients:  EntrepreneurPulse.com.au  Fact Sheet for Healthcare Providers:  IncredibleEmployment.be  This test is no t yet approved or cleared by the [place] FDA and  has been authorized for detection and/or diagnosis of SARS-CoV-2 by FDA under an Emergency Use Authorization (EUA). This EUA will remain  in effect (meaning this test can be used) for the duration of the COVID-19 declaration under Section 564(b)(1) of the Act, 21 U.S.C.section 360bbb-3(b)(1), unless the authorization is terminated  or revoked sooner.       Influenza A by PCR NEGATIVE NEGATIVE   Influenza B by PCR NEGATIVE NEGATIVE    Comment: (NOTE) The Xpert Xpress SARS-CoV-2/FLU/RSV plus assay is intended as an aid in the diagnosis of influenza from Nasopharyngeal swab specimens and should not be used as a sole basis for treatment. Nasal washings and aspirates are unacceptable for Xpert Xpress SARS-CoV-2/FLU/RSV testing.  Fact Sheet for Patients: EntrepreneurPulse.com.au  Fact Sheet for Healthcare Providers: IncredibleEmployment.be  This test is not yet approved or  cleared by the [place] FDA and has been authorized for detection and/or diagnosis of SARS-CoV-2 by FDA under an Emergency Use Authorization (EUA). This EUA will remain in effect (meaning this test can be used) for the duration of the COVID-19 declaration under Section 564(b)(1) of the Act, 21 U.S.C. section 360bbb-3(b)(1), unless the authorization is terminated or revoked.  Performed at [facility], [place] [address] [place], [place] [address]    CT ABDOMEN PELVIS WO CONTRAST  Result Date: 12/18/2020 CLINICAL DATA:  Abdominal pain with bowel obstruction suspected EXAM: CT ABDOMEN AND PELVIS WITHOUT CONTRAST TECHNIQUE: Multidetector CT imaging of the abdomen and pelvis was performed following the standard protocol without IV contrast. COMPARISON:  09/25/2018 FINDINGS: Lower chest: No contributory findings. Small subpleural nodule in the right lower lobe which is stable from 2020 and benign. Hepatobiliary: No focal liver abnormality.Small calcified gallstones. No evidence of cholecystitis. Pancreas: Unremarkable. Spleen: Unremarkable. Adrenals/Urinary Tract: Negative adrenals. No hydronephrosis or stone. Unremarkable bladder. Stomach/Bowel: Fat inflammation around the diverticular sigmoid colon with bubbles of gas scattered from this level to the epigastrium. No ascites or collection. Negative for bowel obstruction. Vascular/Lymphatic: Atheromatous calcifications of the aorta and iliacs. No mass or adenopathy. Reproductive:Enlarged prostate with implants. Other: No ascites or pneumoperitoneum. Musculoskeletal: No acute abnormalities. Generalized lumbar spine degeneration with mild scoliosis. Critical Value/emergent results were called by telephone at the time of interpretation on 12/18/2020 at 5:14 am to provider [facility] , who verbally acknowledged these results. IMPRESSION: Sigmoid diverticulitis complicated by perforation with small volume scattered pneumoperitoneum.  Cholelithiasis. Electronically Signed   By: Jorje Guild M.D.   On: 12/18/2020 05:14    Review of Systems  Constitutional:  Negative for fever.  Respiratory:  Negative for cough and shortness of breath.   Gastrointestinal:  Positive for abdominal distention, abdominal pain and constipation. Negative for nausea and vomiting.  All other systems reviewed and are negative.  Blood pressure (!) 156/86, pulse 83,  temperature 97.8 F (36.6 C), temperature source Oral, resp. rate 18, SpO2 91 %. Physical Exam Constitutional:      General: He is not in acute distress.    Appearance: He is well-developed.  HENT:     Head: Normocephalic and atraumatic.     Mouth/Throat:     Mouth: Mucous membranes are moist.  Eyes:     General: No scleral icterus.    Extraocular Movements: Extraocular movements intact.     Pupils: Pupils are equal, round, and reactive to light.  Cardiovascular:     Rate and Rhythm: Normal rate and regular rhythm.  Pulmonary:     Effort: Pulmonary effort is normal.     Breath sounds: Normal breath sounds.  Abdominal:     General: There is distension.     Tenderness: There is abdominal tenderness in the periumbilical area and left lower quadrant.     Hernia: No hernia is present.  Skin:    General: Skin is warm and dry.     Capillary Refill: Capillary refill takes less than 2 seconds.  Neurological:     General: No focal deficit present.     Mental Status: He is alert.  Psychiatric:        Mood and Affect: Mood normal.        Behavior: Behavior normal.     Assessment/Plan Perforated diverticulitis -has small volume free air but he is not ill systemically and does not have peritonitis. I think reasonable to attempt nonoperative mgt. He has rash from pcn at age 74 so I think reasonable to do zosyn.  Discussed may get better with abx and will need csc, if not better or worse will either need surgery or repeat ct with possible drain/surgery.   -will stop steroids he has  been on short term for back -start home meds -lovenox, scds -discussed plan with he and his wife  Rolm Bookbinder, MD 12/18/2020, 7:30 AM   I spent 45 minutes with patient, reviewing imaging.

## 2020-12-18 NOTE — Progress Notes (Signed)
Pharmacy Antibiotic Note  ZEV BLUE is a 75 y.o. male admitted on 12/18/2020 with perforated diverticulitis. Pharmacy has been consulted for Zosyn dosing. Noted patient has penicillin allergy. Per patient, he had a rash/hives at age of 48. MD is aware of allergy and wishes to proceed with Zosyn with careful monitoring.   Plan: Zosyn 3.375g IV q8h (each dose infused over 4 hours)  Need for further dosage adjustment appears unlikely at present, so pharmacy will sign off at this time.  Please reconsult if a change in clinical status warrants re-evaluation of dosage.   Height: 5\' 11"  (180.3 cm) Weight: 81.6 kg (179 lb 14.3 oz) IBW/kg (Calculated) : 75.3  Temp (24hrs), Avg:97.8 F (36.6 C), Min:97.7 F (36.5 C), Max:97.8 F (36.6 C)  Recent Labs  Lab 12/18/20 0306  WBC 14.6*  CREATININE 1.02    Estimated Creatinine Clearance: 65.6 mL/min (by C-G formula based on SCr of 1.02 mg/dL).    Allergies  Allergen Reactions   Ivp Dye [Iodinated Diagnostic Agents] Hives and Swelling   Iohexol      Code: HIVES, Desc: PT DEVELOPED HIVES DURING IV ADMINISTRATION/    Other Hives   Penicillins Other (See Comments)    unknown   Percocet [Oxycodone-Acetaminophen] Itching   Zetia [Ezetimibe] Nausea And Vomiting     Thank you for allowing pharmacy to be a part of this patients care.  Luiz Ochoa 12/18/2020 11:11 AM

## 2020-12-18 NOTE — ED Provider Notes (Signed)
Emergency Medicine Provider Triage Evaluation Note  Wesley [name] , a 75 y.o. male  was evaluated in triage.  Pt complains of abdominal pain onset 0130 this AM.  States some trouble with BM the past few days.  Denies nausea/vomiting.  No trouble with urination.  No fevers.  No prior abdominal surgeries.  Review of Systems  Positive: Abdominal pain, bloating Negative: Nausea, vomiting  Physical Exam  BP (!) 156/88 (BP Location: Left Arm)    Pulse 99    Temp 97.8 F (36.6 C) (Oral)    Resp 17    SpO2 95%   Gen:   Awake, no distress   Resp:  Normal effort  MSK:   Moves extremities without difficulty  Other:  Abdomen appears somewhat distended and firm to touch but not rigid, bowel sounds decreased  Medical Decision Making  Medically screening exam initiated at 2:54 AM.  Appropriate orders placed.  HILLARD GOODWINE was informed that the remainder of the evaluation will be completed by another provider, this initial triage assessment does not replace that evaluation, and the importance of remaining in the ED until their evaluation is complete.  Abdominal pain.  Somewhat bloated on exam with decreased bowel sounds.  Will obtain labs, CT scan to assess for SBO-- he does have IV dye allergy so will scan with alternative PO contrast.   Larene Pickett, PA-C 12/18/20 3295    Veryl Speak, MD 12/18/20 [phone]

## 2020-12-18 NOTE — Plan of Care (Signed)

## 2020-12-18 NOTE — ED Provider Notes (Signed)
[facility] DEPT Provider Note   CSN: [id number] Arrival date & time: 12/18/20  0240     History Chief Complaint  Patient presents with   Abdominal Pain    Wesley [name] is a 75 y.o. male.  The history is provided by the patient and medical records.  Abdominal Pain  75 year old male with history of depression, diverticulosis, hyperlipidemia, hypertension, presenting to the ED with lower abdominal pain.  Some issues with bowel movements over the past few days, but has been able to pass stool.  States around 0130 this morning pain acutely worsened, all across lower abdomen.  He states he feels bloated.  No fevers.  No difficulty urinating.  No sick contacts.  No meds taken PTA.  Past Medical History:  Diagnosis Date   Depression    Diverticulosis    Hyperlipidemia    Hypertension    Migraine     Patient Active Problem List   Diagnosis Date Noted   Bruxism 05/07/2017   Laryngopharyngeal reflux (LPR) 05/07/2017   Presbycusis of both ears 05/07/2017   Hemorrhoids, external, thrombosed 02/13/2012    Past Surgical History:  Procedure Laterality Date   CYSTOSCOPY WITH INSERTION OF UROLIFT N/A 04/28/2019   Procedure: CYSTOSCOPY WITH INSERTION OF UROLIFT;  Surgeon: Festus Aloe, MD;  Location: [facility];  Service: Urology;  Laterality: N/A;   GANGLION CYST EXCISION     PROSTATE BIOPSY N/A 04/28/2019   Procedure: BIOPSY TRANSRECTAL ULTRASONIC PROSTATE (TUBP);  Surgeon: Festus Aloe, MD;  Location: [facility];  Service: Urology;  Laterality: N/A;       Family History  Problem Relation Age of Onset   Heart disease Mother    Pneumonia Father     Social History   Tobacco Use   Smoking status: Never   Smokeless tobacco: Former    Types: Chew  Substance Use Topics   Alcohol use: Yes    Alcohol/week: 1.0 standard drink    Types: 1 Glasses of wine per week    Comment: Daily.   Drug use: No     Home Medications Prior to Admission medications   Medication Sig Start Date End Date Taking? Authorizing Provider  amLODipine (NORVASC) 10 MG tablet Take 10 mg by mouth daily.    [provider]  atorvastatin (LIPITOR) 10 MG tablet Take 10 mg by mouth daily.    [provider]  buPROPion (WELLBUTRIN XL) 150 MG 24 hr tablet Take 300 mg by mouth daily.     [provider]  ciprofloxacin (CIPRO) 500 MG tablet Take 1 tablet (500 mg total) by mouth 2 (two) times daily. 04/28/19   Festus Aloe, MD  fluticasone (FLONASE) 50 MCG/ACT nasal spray Place 2 sprays into the nose daily as needed for allergies.     [provider]  hydrochlorothiazide (MICROZIDE) 12.5 MG capsule Take 12.5 mg by mouth daily.    [provider]  tamsulosin (FLOMAX) 0.4 MG CAPS capsule Take 0.4 mg by mouth daily after breakfast.    [provider]  valACYclovir (VALTREX) 1000 MG tablet Take 1,000 mg by mouth every 12 (twelve) hours as needed (for flare up).     [provider]    Allergies    Iohexol, Ivp dye [iodinated diagnostic agents], Other, Penicillins, Percocet [oxycodone-acetaminophen], and Zetia [ezetimibe]  Review of Systems   Review of Systems  Gastrointestinal:  Positive for abdominal pain.  All other systems reviewed and are negative.  Physical Exam Updated  Vital Signs BP (!) 156/88 (BP Location: Left Arm)    Pulse 99    Temp 97.8 F (36.6 C) (Oral)    Resp 17    SpO2 95%   Physical Exam Vitals and nursing note reviewed.  Constitutional:      Appearance: He is well-developed.  HENT:     Head: Normocephalic and atraumatic.  Eyes:     Conjunctiva/sclera: Conjunctivae normal.     Pupils: Pupils are equal, round, and reactive to light.  Cardiovascular:     Rate and Rhythm: Normal rate and regular rhythm.     Heart sounds: Normal heart sounds.  Pulmonary:     Effort: Pulmonary effort is normal.     Breath sounds: Normal breath  sounds.  Abdominal:     General: Bowel sounds are normal.     Palpations: Abdomen is soft.     Tenderness: There is abdominal tenderness in the right lower quadrant, suprapubic area and left lower quadrant.     Comments: Mildly distended, tender all across lower abdomen  Musculoskeletal:        General: Normal range of motion.     Cervical back: Normal range of motion.  Skin:    General: Skin is warm and dry.  Neurological:     Mental Status: He is alert and oriented to person, place, and time.    ED Results / Procedures / Treatments   Labs (all labs ordered are listed, but only abnormal results are displayed) Labs Reviewed  CBC WITH DIFFERENTIAL/PLATELET - Abnormal; Notable for the following components:      Result Value   WBC 14.6 (*)    Neutro Abs 12.5 (*)    Abs Immature Granulocytes 0.13 (*)    All other components within normal limits  COMPREHENSIVE METABOLIC PANEL - Abnormal; Notable for the following components:   Glucose, Bld 128 (*)    BUN 24 (*)    All other components within normal limits  URINALYSIS, ROUTINE W REFLEX MICROSCOPIC - Abnormal; Notable for the following components:   Ketones, ur 5 (*)    All other components within normal limits  RESP PANEL BY RT-PCR (FLU A&B, COVID) ARPGX2  LIPASE, BLOOD    EKG None  Radiology CT ABDOMEN PELVIS WO CONTRAST  Result Date: 12/18/2020 CLINICAL DATA:  Abdominal pain with bowel obstruction suspected EXAM: CT ABDOMEN AND PELVIS WITHOUT CONTRAST TECHNIQUE: Multidetector CT imaging of the abdomen and pelvis was performed following the standard protocol without IV contrast. COMPARISON:  09/25/2018 FINDINGS: Lower chest: No contributory findings. Small subpleural nodule in the right lower lobe which is stable from 2020 and benign. Hepatobiliary: No focal liver abnormality.Small calcified gallstones. No evidence of cholecystitis. Pancreas: Unremarkable. Spleen: Unremarkable. Adrenals/Urinary Tract: Negative adrenals. No  hydronephrosis or stone. Unremarkable bladder. Stomach/Bowel: Fat inflammation around the diverticular sigmoid colon with bubbles of gas scattered from this level to the epigastrium. No ascites or collection. Negative for bowel obstruction. Vascular/Lymphatic: Atheromatous calcifications of the aorta and iliacs. No mass or adenopathy. Reproductive:Enlarged prostate with implants. Other: No ascites or pneumoperitoneum. Musculoskeletal: No acute abnormalities. Generalized lumbar spine degeneration with mild scoliosis. Critical Value/emergent results were called by telephone at the time of interpretation on 12/18/2020 at 5:14 am to provider [facility] , who verbally acknowledged these results. IMPRESSION: Sigmoid diverticulitis complicated by perforation with small volume scattered pneumoperitoneum. Cholelithiasis. Electronically Signed   By: Jorje Guild M.D.   On: 12/18/2020 05:14    Procedures Procedures   CRITICAL CARE Performed by:  Larene Pickett   Total critical care time: 45 minutes  Critical care time was exclusive of separately billable procedures and treating other patients.  Critical care was necessary to treat or prevent imminent or life-threatening deterioration.  Critical care was time spent personally by me on the following activities: development of treatment plan with patient and/or surrogate as well as nursing, discussions with consultants, evaluation of patient's response to treatment, examination of patient, obtaining history from patient or surrogate, ordering and performing treatments and interventions, ordering and review of laboratory studies, ordering and review of radiographic studies, pulse oximetry and re-evaluation of patient's condition.   Medications Ordered in ED Medications  ertapenem (INVANZ) 1,000 mg in sodium chloride 0.9 % 100 mL IVPB (has no administration in time range)  ondansetron (ZOFRAN) injection 4 mg (has no administration in time range)  morphine  4 MG/ML injection 4 mg (4 mg Intravenous Given 12/18/20 0534)    ED Course  I have reviewed the triage vital signs and the nursing notes.  Pertinent labs & imaging results that were available during my care of the patient were reviewed by me and considered in my medical decision making (see chart for details).    MDM Rules/Calculators/A&P  75 y.o. M here with lower abdominal pain, worsening since 0130 this AM.  Uncomfortable appearing on exam, tender throughout lower abdomen.  Labs as above-- does have leukocytosis.  CT obtained from triage-- diverticulitis with small volume scattered pneumoperitoneum.  Has penicillin allergy (rash w/facial swelling as a child) so will start cipro/flagyl.  Morphine for pain control.  Will require admission.    Discussed with Dr. Donne Hazel-- recommended to switch abx to [facility], this has been done.  He will admit patient to surgical service.  Final Clinical Impression(s) / ED Diagnoses Final diagnoses:  Diverticulitis of large intestine with perforation without bleeding    Rx / DC Orders ED Discharge Orders     None        Larene Pickett, PA-C 12/18/20 4801    Veryl Speak, MD 12/18/20 [phone]

## 2020-12-18 NOTE — ED Notes (Signed)
Pt placed on 3L. O2 dropped after dilaudid admin. PA aware.

## 2020-12-18 NOTE — ED Notes (Signed)
Dr. Wakefield at bedside. 

## 2020-12-19 LAB — BASIC METABOLIC PANEL
Anion gap: 4 — ABNORMAL LOW (ref 5–15)
BUN: 17 mg/dL (ref 8–23)
CO2: 28 mmol/L (ref 22–32)
Calcium: 8.1 mg/dL — ABNORMAL LOW (ref 8.9–10.3)
Chloride: 101 mmol/L (ref 98–111)
Creatinine, Ser: 1.02 mg/dL (ref 0.61–1.24)
GFR, Estimated: >60 mL/min (ref >60)
Glucose, Bld: 102 mg/dL — ABNORMAL HIGH (ref 70–99)
Potassium: 3.9 mmol/L (ref 3.5–5.1)
Sodium: 133 mmol/L — ABNORMAL LOW (ref 135–145)

## 2020-12-19 LAB — CBC
HCT: 36.7 % — ABNORMAL LOW (ref 39.0–52.0)
Hemoglobin: 12.1 g/dL — ABNORMAL LOW (ref 13.0–17.0)
MCH: 30.6 pg (ref 26.0–34.0)
MCHC: 33 g/dL (ref 30.0–36.0)
MCV: 92.7 fL (ref 80.0–100.0)
Platelets: 264 10*3/uL (ref 150–400)
RBC: 3.96 MIL/uL — ABNORMAL LOW (ref 4.22–5.81)
RDW: 13.7 % (ref 11.5–15.5)
WBC: 14.4 10*3/uL — ABNORMAL HIGH (ref 4.0–10.5)
nRBC: 0 % (ref 0.0–0.2)

## 2020-12-19 MED ORDER — ACETAMINOPHEN 500 MG PO TABS
1000.0000 mg | ORAL_TABLET | Freq: Four times a day (QID) | ORAL | Status: DC
  Administered 2020-12-19 – 2020-12-21 (×9): 1000 mg via ORAL
  Filled 2020-12-19 (×9): qty 2

## 2020-12-19 NOTE — Progress Notes (Signed)
Patient ID: Wesley [name], male   DOB: October 01, 1945, 75 y.o.   MRN: [id number]   Acute Care Surgery Service Progress Note:    Chief Complaint/Subjective: Wife and son at [facility] Pain is no longer "constant" but still has pain in lower abd with coughing, movement Had some nausea when got up yesterday to walk to bathroom Has condom cath Having some flatus but no BM  Objective: Vital signs in last 24 hours: Temp:  [97.5 F (36.4 C)-98.9 F (37.2 C)] 98.9 F (37.2 C) (12/19 0448) Pulse Rate:  [66-76] 70 (12/19 0448) Resp:  [14-18] 17 (12/19 0448) BP: (106-136)/(64-89) 111/74 (12/19 0448) SpO2:  [90 %-95 %] 90 % (12/19 0448) Last BM Date: 12/16/20  Intake/Output from previous day: 12/18 0701 - 12/19 0700 In: 2061.6 [P.O.:30; I.V.:1902.2; IV Piggyback:129.4] Out: 1400 [Urine:1400] Intake/Output this shift: No intake/output data recorded.  Lungs: cta, nonlabored  Cardiovascular: reg  Abd: some bloating, TTP in lower abd L>R; no peritonitis  Extremities: no edema, +SCDs  Neuro: alert, nonfocal  Lab Results: CBC  Recent Labs    12/18/20 0306 12/19/20 0334  WBC 14.6* 14.4*  HGB 15.2 12.1*  HCT 45.9 36.7*  PLT 362 264   BMET Recent Labs    12/18/20 0306 12/19/20 0334  NA 135 133*  K 3.7 3.9  CL 102 101  CO2 22 28  GLUCOSE 128* 102*  BUN 24* 17  CREATININE 1.02 1.02  CALCIUM 9.1 8.1*   LFT Hepatic Function Latest Ref Rng & Units 12/18/2020 12/22/2014  Total Protein 6.5 - 8.1 g/dL 7.7 7.0  Albumin 3.5 - 5.0 g/dL 3.6 3.9  AST 15 - 41 U/L 30 28  ALT 0 - 44 U/L 40 29  Alk Phosphatase 38 - 126 U/L 96 93  Total Bilirubin 0.3 - 1.2 mg/dL 0.8 1.0   PT/INR No results for input(s): LABPROT, INR in the last 72 hours. ABG No results for input(s): PHART, HCO3 in the last 72 hours.  Invalid input(s): PCO2, PO2  Studies/Results:  Anti-infectives: Anti-infectives (From admission, onward)    Start     Dose/Rate Route Frequency Ordered Stop   12/18/20 1400   piperacillin-tazobactam (ZOSYN) IVPB 3.375 g        3.375 g 12.5 mL/hr over 240 Minutes Intravenous Every 8 hours 12/18/20 0945     12/18/20 0545  ertapenem (INVANZ) 1,000 mg in sodium chloride 0.9 % 100 mL IVPB        1 g 200 mL/hr over 30 Minutes Intravenous  Once 12/18/20 0536 12/18/20 0620   12/18/20 0530  ciprofloxacin (CIPRO) IVPB 400 mg  Status:  Discontinued        400 mg 200 mL/hr over 60 Minutes Intravenous  Once 12/18/20 0522 12/18/20 0532   12/18/20 0530  metroNIDAZOLE (FLAGYL) IVPB 500 mg  Status:  Discontinued        500 mg 100 mL/hr over 60 Minutes Intravenous Every 12 hours 12/18/20 0522 12/18/20 0532       Medications: Scheduled Meds:  acetaminophen  1,000 mg Oral Q6H   amLODipine  10 mg Oral Daily   buPROPion  300 mg Oral Daily   docusate sodium  100 mg Oral BID   enoxaparin (LOVENOX) injection  40 mg Subcutaneous Q24H   hydrochlorothiazide  12.5 mg Oral Daily   tamsulosin  0.4 mg Oral QPC breakfast   Continuous Infusions:  sodium chloride 100 mL/hr at 12/19/20 0335   sodium chloride 10 mL/hr at 12/18/20 1204   piperacillin-tazobactam (ZOSYN)  IV 3.375 g (12/19/20 0338)   PRN Meds:.sodium chloride, fluticasone, HYDROmorphone (DILAUDID) injection, methocarbamol, ondansetron **OR** ondansetron (ZOFRAN) IV, simethicone  Assessment/Plan: Patient Active Problem List   Diagnosis Date Noted   Diverticulitis of colon with perforation 12/18/2020   Bruxism 05/07/2017   Laryngopharyngeal reflux (LPR) 05/07/2017   Presbycusis of both ears 05/07/2017   Hemorrhoids, external, thrombosed 02/13/2012   Perforated diverticulitis - -no fever, stable WBC, no tachycardia; exam stable -will cont medical mgmt today. Will allow sips this am -will reassess later today to see if ready for CLD -rediscussed mgmt of diverticulitis with pt and family - discussed typical hospitalization, potential mgmt options if worsens/doesn't improve -cont IV abx  HTN - cont home meds   FEN -  ivf, lytes ok. CLD VTE prophylaxis - scds, lovenox ID - zosyn  Disposition: ambulate, sips this am  LOS: 1 day    Alexismarie Flaim M. Redmond Pulling, MD, FACS General, Bariatric, & Minimally Invasive Surgery [phone] [facility]

## 2020-12-19 NOTE — Progress Notes (Signed)
Initial Nutrition Assessment  DOCUMENTATION CODES:   Not applicable  INTERVENTION:  - diet advancement as medically feasible. - monitor for education needs prior to d/c.   NUTRITION DIAGNOSIS:   Inadequate oral intake related to acute illness, inability to eat as evidenced by NPO status.  GOAL:   Patient will meet greater than or equal to 90% of their needs  MONITOR:   Diet advancement, Labs, Weight trends  REASON FOR ASSESSMENT:   Malnutrition Screening Tool  ASSESSMENT:   75 year old male with medical history of depression, diverticulosis, HLD, and HTN. He presented to the ED due to lower abdominal pain and issues with BMs for the few days PTA.  He has been NPO since admission; starting at 0914 today allowed to have clear liquids from the floor stock.   Patient sitting in the chair with no visitors present at the time of RD visit. He shares that his wife and son recently left.  Patient shares that he and his wife went to [place] in November and after which tested positive for COVID.   He does not weigh himself and is unsure if he has experienced any weight changes recently. Weight yesterday was 180 lb and PTA the most recently documented weight was 181 lb on 04/28/19.   Patient reports that the last time he had solid food was the night prior to presentation. He had pot pie that his wife cooked and appetizers at a party. No discomfort with eating at that time. Abdominal pain began at ~0100 on the day of presentation, sharp and constant in nature.   Patient shares that this AM abdomen was tender to the touch but currently is not; improvement in pain management.    Labs reviewed; Na: 133 mmol/l, Ca: 8.1 mg/dl.  Medications reviewed; 100 mg colace BID, 12/5 mg hydrodiuril/day.  IVF; NS @ 100 ml/hr.     NUTRITION - FOCUSED PHYSICAL EXAM:  No muscle or fat depletions.  Diet Order:   Diet Order             Diet NPO time specified Except for: Ice Chips, Sips with  Meds, Other (See Comments)  Diet effective now                   EDUCATION NEEDS:   Not appropriate for education at this time  Skin:  Skin Assessment: Reviewed RN Assessment  Last BM:  12/17 per nursing flow sheet  Height:   Ht Readings from Last 1 Encounters:  12/18/20 5\' 11"  (1.803 m)    Weight:   Wt Readings from Last 1 Encounters:  12/18/20 81.6 kg     Estimated Nutritional Needs:  Kcal:  2050-2250 kcal Protein:  105-120 grams Fluid:  >/= 2.3 L/day      Jarome Matin, MS, RD, LDN, CNSC Inpatient Clinical Dietitian RD pager # available in [place]  After hours/weekend pager # available in [facility]

## 2020-12-19 NOTE — Progress Notes (Signed)
Transition of Care [facility]) Screening Note  Patient Details  Name: Wesley [name] Date of Birth: [birth year]  Transition of Care [facility]) CM/SW Contact:    Sherie Don, LCSW Phone Number: 12/19/2020, 9:18 AM  Transition of Care Department [facility]) has reviewed patient and no TOC needs have been identified at this time. We will continue to monitor patient advancement through interdisciplinary progression rounds. If new patient transition needs arise, please place a TOC consult.

## 2020-12-20 LAB — CBC
HCT: 36.4 % — ABNORMAL LOW (ref 39.0–52.0)
Hemoglobin: 11.9 g/dL — ABNORMAL LOW (ref 13.0–17.0)
MCH: 31.2 pg (ref 26.0–34.0)
MCHC: 32.7 g/dL (ref 30.0–36.0)
MCV: 95.3 fL (ref 80.0–100.0)
Platelets: 215 10*3/uL (ref 150–400)
RBC: 3.82 MIL/uL — ABNORMAL LOW (ref 4.22–5.81)
RDW: 14.1 % (ref 11.5–15.5)
WBC: 12 10*3/uL — ABNORMAL HIGH (ref 4.0–10.5)
nRBC: 0 % (ref 0.0–0.2)

## 2020-12-20 MED ORDER — BOOST / RESOURCE BREEZE PO LIQD CUSTOM
1.0000 | Freq: Two times a day (BID) | ORAL | Status: DC
  Administered 2020-12-20 (×2): 1 via ORAL

## 2020-12-20 MED ORDER — ZOLPIDEM TARTRATE 5 MG PO TABS
5.0000 mg | ORAL_TABLET | Freq: Every evening | ORAL | Status: DC | PRN
  Administered 2020-12-20: 22:00:00 5 mg via ORAL
  Filled 2020-12-20: qty 1

## 2020-12-20 NOTE — Progress Notes (Signed)
Patient ID: JC VERON, male   DOB: 1945-08-04, 75 y.o.   MRN: [id number]   Acute Care Surgery Service Progress Note:    Chief Complaint/Subjective: Wife and son at [facility] Pain is much better but still having some pain at times, cramps at times CLD went fine Walked Having BMs, asks that stool softner be stopped  Objective: Vital signs in last 24 hours: Temp:  [97.6 F (36.4 C)-99 F (37.2 C)] 98.1 F (36.7 C) (12/20 0524) Pulse Rate:  [56-66] 61 (12/20 0524) Resp:  [14-20] 17 (12/20 0524) BP: (88-109)/(55-63) 105/62 (12/20 0524) SpO2:  [90 %-92 %] 92 % (12/20 0524) Last BM Date: 12/17/20  Intake/Output from previous day: 12/19 0701 - 12/20 0700 In: 2998 [P.O.:910; I.V.:1967.4; IV Piggyback:120.5] Out: 1250 [Urine:1250] Intake/Output this shift: No intake/output data recorded.  Lungs: cta, nonlabored  Cardiovascular: reg  Abd: some bloating, less TTP in lower abd L>R; no peritonitis  Extremities: no edema, +SCDs  Neuro: alert, nonfocal; smiling  Lab Results: CBC  Recent Labs    12/19/20 0334 12/20/20 0347  WBC 14.4* 12.0*  HGB 12.1* 11.9*  HCT 36.7* 36.4*  PLT 264 215    BMET Recent Labs    12/18/20 0306 12/19/20 0334  NA 135 133*  K 3.7 3.9  CL 102 101  CO2 22 28  GLUCOSE 128* 102*  BUN 24* 17  CREATININE 1.02 1.02  CALCIUM 9.1 8.1*    LFT Hepatic Function Latest Ref Rng & Units 12/18/2020 12/22/2014  Total Protein 6.5 - 8.1 g/dL 7.7 7.0  Albumin 3.5 - 5.0 g/dL 3.6 3.9  AST 15 - 41 U/L 30 28  ALT 0 - 44 U/L 40 29  Alk Phosphatase 38 - 126 U/L 96 93  Total Bilirubin 0.3 - 1.2 mg/dL 0.8 1.0   PT/INR No results for input(s): LABPROT, INR in the last 72 hours. ABG No results for input(s): PHART, HCO3 in the last 72 hours.  Invalid input(s): PCO2, PO2  Studies/Results:  Anti-infectives: Anti-infectives (From admission, onward)    Start     Dose/Rate Route Frequency Ordered Stop   12/18/20 1400  piperacillin-tazobactam (ZOSYN) IVPB  3.375 g        3.375 g 12.5 mL/hr over 240 Minutes Intravenous Every 8 hours 12/18/20 0945     12/18/20 0545  ertapenem (INVANZ) 1,000 mg in sodium chloride 0.9 % 100 mL IVPB        1 g 200 mL/hr over 30 Minutes Intravenous  Once 12/18/20 0536 12/18/20 0620   12/18/20 0530  ciprofloxacin (CIPRO) IVPB 400 mg  Status:  Discontinued        400 mg 200 mL/hr over 60 Minutes Intravenous  Once 12/18/20 0522 12/18/20 0532   12/18/20 0530  metroNIDAZOLE (FLAGYL) IVPB 500 mg  Status:  Discontinued        500 mg 100 mL/hr over 60 Minutes Intravenous Every 12 hours 12/18/20 0522 12/18/20 0532       Medications: Scheduled Meds:  acetaminophen  1,000 mg Oral Q6H   amLODipine  10 mg Oral Daily   buPROPion  300 mg Oral Daily   enoxaparin (LOVENOX) injection  40 mg Subcutaneous Q24H   feeding supplement  1 Container Oral BID BM   hydrochlorothiazide  12.5 mg Oral Daily   tamsulosin  0.4 mg Oral QPC breakfast   Continuous Infusions:  sodium chloride 100 mL/hr at 12/20/20 0927   sodium chloride 10 mL/hr at 12/18/20 1204   piperacillin-tazobactam (ZOSYN)  IV 3.375 g (  12/20/20 0348)   PRN Meds:.sodium chloride, fluticasone, HYDROmorphone (DILAUDID) injection, methocarbamol, ondansetron **OR** ondansetron (ZOFRAN) IV, simethicone  Assessment/Plan: Patient Active Problem List   Diagnosis Date Noted   Diverticulitis of colon with perforation 12/18/2020   Bruxism 05/07/2017   Laryngopharyngeal reflux (LPR) 05/07/2017   Presbycusis of both ears 05/07/2017   Hemorrhoids, external, thrombosed 02/13/2012   Perforated diverticulitis - -no fever, improving WBC, no tachycardia; exam stable -will cont medical mgmt today.  -adv to FLD, shakes  -cont IV abx  HTN - cont home meds   FEN - ivf, lytes ok. FLD VTE prophylaxis - scds, lovenox ID - zosyn  Disposition: FLD today; if cont to do well, probable home Wed pm; will need outpt colonoscopy  LOS: 2 days    Leighton Ruff. Redmond Pulling, MD, FACS General,  Bariatric, & Minimally Invasive Surgery [phone] [facility]

## 2020-12-20 NOTE — Plan of Care (Signed)
°  Problem: Clinical Measurements: Goal: Ability to maintain clinical measurements within normal limits will improve Outcome: Progressing   Problem: Activity: Goal: Risk for activity intolerance will decrease Outcome: Progressing   Problem: Pain Managment: Goal: General experience of comfort will improve Outcome: Progressing   Problem: Elimination: Goal: Will not experience complications related to bowel motility Outcome: Progressing

## 2020-12-21 LAB — CBC
HCT: 35.8 % — ABNORMAL LOW (ref 39.0–52.0)
Hemoglobin: 12 g/dL — ABNORMAL LOW (ref 13.0–17.0)
MCH: 31.3 pg (ref 26.0–34.0)
MCHC: 33.5 g/dL (ref 30.0–36.0)
MCV: 93.2 fL (ref 80.0–100.0)
Platelets: 223 10*3/uL (ref 150–400)
RBC: 3.84 MIL/uL — ABNORMAL LOW (ref 4.22–5.81)
RDW: 14.2 % (ref 11.5–15.5)
WBC: 10.4 10*3/uL (ref 4.0–10.5)
nRBC: 0 % (ref 0.0–0.2)

## 2020-12-21 MED ORDER — AMOXICILLIN-POT CLAVULANATE 875-125 MG PO TABS: 1.0000 | ORAL_TABLET | Freq: Two times a day (BID) | ORAL | 0 refills | Status: AC

## 2020-12-21 MED ORDER — ACETAMINOPHEN 500 MG PO TABS: 1000.0000 mg | ORAL_TABLET | Freq: Four times a day (QID) | ORAL | 0 refills | Status: DC | PRN

## 2020-12-21 NOTE — Evaluation (Signed)
Physical Therapy Evaluation Patient Details Name: Wesley [name] MRN: [id number] DOB: 02/22/1945 Today's Date: 12/21/2020  History of Present Illness  75 yo male adm with abd pain,  CT scan consistent with perforated diverticulitis. PMH: HTN, migraine, recent covid, diverticulosis  Clinical Impression  Patient evaluated by Physical Therapy with no further acute PT needs identified. All education has been completed and the patient has no further questions.  Pt is weaker, deconditioned from his baseline. Appears somewhat related to recent Covid in addition to current hospitalization. Recommend OPPT to maximize pt independence, higher level balance work as well as conditioning.  Pt and wife are in agreement given pt's recent medical issues.   See below for any follow-up Physical Therapy or equipment needs. PT is signing off. Thank you for this referral.        Recommendations for follow up therapy are one component of a multi-disciplinary discharge planning process, led by the attending physician.  Recommendations may be updated based on patient status, additional functional criteria and insurance authorization.  Follow Up Recommendations Outpatient PT    Assistance Recommended at Discharge Intermittent Supervision/Assistance  Functional Status Assessment Patient has had a recent decline in their functional status and demonstrates the ability to make significant improvements in function in a reasonable and predictable amount of time.  Equipment Recommendations  None recommended by PT    Recommendations for Other Services       Precautions / Restrictions Precautions Precautions: Fall      Mobility  Bed Mobility Overal bed mobility: Needs Assistance Bed Mobility: Supine to Sit     Supine to sit: Independent          Transfers Overall transfer level: Modified independent                      Ambulation/Gait Ambulation/Gait assistance: Supervision;Min  guard Gait Distance (Feet): 300 Feet Assistive device: None;IV Pole;Straight cane Gait Pattern/deviations: Step-through pattern;Wide base of support       General Gait Details: progression from IV pole to cane to no device.  cues for use of cane, initially unsteady however improved with distance. mild dyspnea however recovered quickly.  Stairs Stairs: Yes Stairs assistance: Min guard Stair Management: One rail Right;Alternating pattern Number of Stairs: 4 General stair comments: min/guard for safety  Wheelchair Mobility    Modified Rankin (Stroke Patients Only)       Balance                                             Pertinent Vitals/Pain Pain Assessment: Faces Faces Pain Scale: Hurts a little bit Pain Location: lower abd Pain Descriptors / Indicators: Sore Pain Intervention(s): Limited activity within patient's tolerance;Monitored during session    Home Living Family/patient expects to be discharged to:: Private residence Living Arrangements: Spouse/significant other Available Help at Discharge: Friend(s) Type of Home: House Home Access: Stairs to enter Entrance Stairs-Rails: Right Entrance Stairs-Number of Steps: 4   Home Layout: Two level;Able to live on main level with bedroom/bathroom Home Equipment: None      Prior Function Prior Level of Function : Independent/Modified Independent                     Hand Dominance        Extremity/Trunk Assessment   Upper Extremity Assessment Upper Extremity Assessment: Overall WFL for tasks  assessed    Lower Extremity Assessment Lower Extremity Assessment: Overall WFL for tasks assessed       Communication   Communication: No difficulties  Cognition Arousal/Alertness: Awake/alert Behavior During Therapy: WFL for tasks assessed/performed Overall Cognitive Status: Within Functional Limits for tasks assessed                                          General  Comments      Exercises     Assessment/Plan    PT Assessment All further PT needs can be met in the next venue of care  PT Problem List         PT Treatment Interventions      PT Goals (Current goals can be found in the Care Plan section)  Acute Rehab PT Goals Patient Stated Goal: home soon PT Goal Formulation: All assessment and education complete, DC therapy    Frequency     Barriers to discharge        Co-evaluation               AM-PAC PT "6 Clicks" Mobility  Outcome Measure Help needed turning from your back to your side while in a flat bed without using bedrails?: None Help needed moving from lying on your back to sitting on the side of a flat bed without using bedrails?: None Help needed moving to and from a bed to a chair (including a wheelchair)?: None Help needed standing up from a chair using your arms (e.g., wheelchair or bedside chair)?: None Help needed to walk in hospital room?: A Little Help needed climbing 3-5 steps with a railing? : A Little 6 Click Score: 22    End of Session Equipment Utilized During Treatment: Gait belt Activity Tolerance: Patient tolerated treatment well Patient left: with call bell/phone within reach;in chair;with family/visitor present   PT Visit Diagnosis: Difficulty in walking, not elsewhere classified (R26.2)    Time: 1660-6301 PT Time Calculation (min) (ACUTE ONLY): 23 min   Charges:   PT Evaluation $PT Eval Low Complexity: 1 Low PT Treatments $Gait Training: 8-22 mins        Baxter Flattery, PT  Acute Rehab Dept ([place]) [phone] Pager [phone]  12/21/2020   [facility] 12/21/2020, 1:48 PM

## 2020-12-21 NOTE — Discharge Summary (Signed)
[facility] Discharge Summary   Patient ID: Wesley [name] MRN: [id number] DOB/AGE: May 03, 1945 75 y.o.  Admit date: 12/18/2020 Discharge date: 12/21/2020  Admitting Diagnosis: Perforated diverticulitis  Discharge Diagnosis Patient Active Problem List   Diagnosis Date Noted   Diverticulitis of colon with perforation 12/18/2020   Bruxism 05/07/2017   Laryngopharyngeal reflux (LPR) 05/07/2017   Presbycusis of both ears 05/07/2017   Hemorrhoids, external, thrombosed 02/13/2012    Consultants None  Imaging: No results found.  Procedures None  Hospital Course:  Wesley [name] is a 75yo male PMH HTN and h/o diverticulitis who presented to [facility] 12/18/20 with acute onset abdominal pain.  Workup showed elevated WBC and CT consistent with perforated diverticulitis.  Patient was admitted to the surgical service for bowel rest and IV antibiotics. He was started on IV zosyn. Leukocytosis and pain improved at which point diet was advanced as tolerated. On 12/21/20 the patient was felt to be medically stable for discharge. He will go home with 10 days of augmentin and follow up with gastroenterology for a colonoscopy in 6-8 weeks. Recommend follow up with colorectal surgeon after GI appointment to discuss elective resection. Patient will follow up as below and knows to call with questions or concerns.     Allergies as of 12/21/2020       Reactions   Ivp Dye [iodinated Diagnostic Agents] Hives, Swelling   Iohexol     Code: HIVES, Desc: PT DEVELOPED HIVES DURING IV ADMINISTRATION/   Other Hives   Percocet [oxycodone-acetaminophen] Itching   Zetia [ezetimibe] Nausea And Vomiting        Medication List     STOP taking these medications    ciprofloxacin 500 MG tablet Commonly known as: Cipro       TAKE these medications    acetaminophen 500 MG tablet Commonly known as: TYLENOL Take 2 tablets (1,000 mg total) by mouth every 6 (six) hours as needed for  mild pain.   amLODipine 10 MG tablet Commonly known as: NORVASC Take 10 mg by mouth daily.   amoxicillin-clavulanate 875-125 MG tablet Commonly known as: Augmentin Take 1 tablet by mouth 2 (two) times daily for 10 days.   atorvastatin 10 MG tablet Commonly known as: LIPITOR Take 10 mg by mouth daily.   buPROPion 150 MG 24 hr tablet Commonly known as: WELLBUTRIN XL Take 150 mg by mouth daily.   fluticasone 50 MCG/ACT nasal spray Commonly known as: FLONASE Place 2 sprays into the nose daily as needed for allergies.   hydrochlorothiazide 12.5 MG capsule Commonly known as: MICROZIDE Take 12.5 mg by mouth daily.   promethazine-dextromethorphan 6.25-15 MG/5ML syrup Commonly known as: PROMETHAZINE-DM Take 5 mLs by mouth every 6 (six) hours as needed for cough.   tamsulosin 0.4 MG Caps capsule Commonly known as: FLOMAX Take 0.4 mg by mouth daily after breakfast.   triamcinolone cream 0.1 % Commonly known as: KENALOG Apply 1 application topically 2 (two) times daily as needed (rash, infection).   valACYclovir 1000 MG tablet Commonly known as: VALTREX Take 1,000 mg by mouth every 12 (twelve) hours as needed (for flare up).          Follow-up [facility], [place]. Call.   Specialty: General Surgery Why: Call to schedule follow up with one of our colorectal specialists after your appointment with gastroenterology/ colonoscopy Contact information: [address] [place], [place],  [place]. Schedule an appointment as soon as possible for a visit in 2 week(s).   Specialty: Gastroenterology Why: Call to arrange follow up with your gastroenterologist, discuss colonoscopy Contact information: [address] [place] [address] [phone]         Glenis Smoker, MD. Call.   Specialty: Family Medicine Why: As needed Contact information: [place] [address] [phone]                 Signed: Barkley Boards, [facility] 12/21/2020, 3:23 PM Please see Amion for pager number during day hours 7:00am-4:30pm

## 2020-12-21 NOTE — Care Management Important Message (Signed)
Important Message  Patient Details IM Letter placed in Patients room. Name: ALTUS ZAINO MRN: [id number] Date of Birth: [birth year]   Medicare Important Message Given:  Yes     Kerin Salen 12/21/2020, 11:29 AM

## 2020-12-21 NOTE — Plan of Care (Signed)

## 2020-12-21 NOTE — Progress Notes (Signed)
NUTRITION NOTE  Consult received for low fiber/soft diet education.  Patient sitting up in bed and wife at bedside. He had a late breakfast/brunch of eggs, breakfast potatoes, and hot chocolate. He was able to tolerate this meal without issue or worsening of abdominal pain. Abdominal pain is much improved since admission but is aggravated by intermittent cough.  Provided patient and wife with "Fiber Restricted Nutrition Therapy" and "Fiber Content of Foods" handouts from the Academy of Nutrition and Dietetics.   Reviewed low fiber and high fiber food options, adequate fluid intake (especially with increase of fiber intake), and how to transition from low fiber to high fiber diet.   It is not currently known how long patient will need to remain on a low fiber diet; encouraged patient and/or wife to ask Surgery team prior to d/c.     Jarome Matin, MS, RD, LDN, CNSC Inpatient Clinical Dietitian RD pager # available in [place]  After hours/weekend pager # available in [facility]

## 2020-12-21 NOTE — Progress Notes (Signed)
Patient ID: AZUL BRUMETT, male   DOB: December 16, 1945, 75 y.o.   MRN: [id number]   Acute Care Surgery Service Progress Note:    Chief Complaint/Subjective: Wife  at [facility] FLD went ok Minimal discomfort now. Walked Having BMs,  Objective: Vital signs in last 24 hours: Temp:  [98.1 F (36.7 C)-98.6 F (37 C)] 98.1 F (36.7 C) (12/21 0550) Pulse Rate:  [58-64] 58 (12/21 0550) Resp:  [16-18] 16 (12/21 0550) BP: (101-122)/(65-77) 122/76 (12/21 0550) SpO2:  [90 %-94 %] 94 % (12/21 0550) Last BM Date: 12/21/20  Intake/Output from previous day: 12/20 0701 - 12/21 0700 In: 2703.7 [P.O.:720; I.V.:1864.1; IV Piggyback:119.6] Out: 452 [Urine:451; Stool:1] Intake/Output this shift: Total I/O In: 300 [P.O.:300] Out: 800 [Urine:800]  Lungs: cta, nonlabored  Cardiovascular: reg  Abd: some bloating, no significant tenderness to palpation ; no peritonitis  Extremities: no edema, +SCDs  Neuro: alert, nonfocal; smiling  Lab Results: CBC  Recent Labs    12/20/20 0347 12/21/20 0350  WBC 12.0* 10.4  HGB 11.9* 12.0*  HCT 36.4* 35.8*  PLT 215 223    BMET Recent Labs    12/19/20 0334  NA 133*  K 3.9  CL 101  CO2 28  GLUCOSE 102*  BUN 17  CREATININE 1.02  CALCIUM 8.1*    LFT Hepatic Function Latest Ref Rng & Units 12/18/2020 12/22/2014  Total Protein 6.5 - 8.1 g/dL 7.7 7.0  Albumin 3.5 - 5.0 g/dL 3.6 3.9  AST 15 - 41 U/L 30 28  ALT 0 - 44 U/L 40 29  Alk Phosphatase 38 - 126 U/L 96 93  Total Bilirubin 0.3 - 1.2 mg/dL 0.8 1.0   PT/INR No results for input(s): LABPROT, INR in the last 72 hours. ABG No results for input(s): PHART, HCO3 in the last 72 hours.  Invalid input(s): PCO2, PO2  Studies/Results:  Anti-infectives: Anti-infectives (From admission, onward)    Start     Dose/Rate Route Frequency Ordered Stop   12/18/20 1400  piperacillin-tazobactam (ZOSYN) IVPB 3.375 g        3.375 g 12.5 mL/hr over 240 Minutes Intravenous Every 8 hours 12/18/20 0945      12/18/20 0545  ertapenem (INVANZ) 1,000 mg in sodium chloride 0.9 % 100 mL IVPB        1 g 200 mL/hr over 30 Minutes Intravenous  Once 12/18/20 0536 12/18/20 0620   12/18/20 0530  ciprofloxacin (CIPRO) IVPB 400 mg  Status:  Discontinued        400 mg 200 mL/hr over 60 Minutes Intravenous  Once 12/18/20 0522 12/18/20 0532   12/18/20 0530  metroNIDAZOLE (FLAGYL) IVPB 500 mg  Status:  Discontinued        500 mg 100 mL/hr over 60 Minutes Intravenous Every 12 hours 12/18/20 0522 12/18/20 0532       Medications: Scheduled Meds:  acetaminophen  1,000 mg Oral Q6H   amLODipine  10 mg Oral Daily   buPROPion  300 mg Oral Daily   enoxaparin (LOVENOX) injection  40 mg Subcutaneous Q24H   feeding supplement  1 Container Oral BID BM   hydrochlorothiazide  12.5 mg Oral Daily   tamsulosin  0.4 mg Oral QPC breakfast   Continuous Infusions:  sodium chloride 100 mL/hr at 12/20/20 2027   sodium chloride 10 mL/hr at 12/21/20 0403   piperacillin-tazobactam (ZOSYN)  IV 3.375 g (12/21/20 0403)   PRN Meds:.sodium chloride, fluticasone, HYDROmorphone (DILAUDID) injection, methocarbamol, ondansetron **OR** ondansetron (ZOFRAN) IV, simethicone, zolpidem  Assessment/Plan: Patient Active  Problem List   Diagnosis Date Noted   Diverticulitis of colon with perforation 12/18/2020   Bruxism 05/07/2017   Laryngopharyngeal reflux (LPR) 05/07/2017   Presbycusis of both ears 05/07/2017   Hemorrhoids, external, thrombosed 02/13/2012   Perforated diverticulitis - -no fever, improving WBC, no tachycardia; exam stable -will cont medical mgmt today.  -adv to soft diet, shakes  -cont IV abx  HTN - cont home meds   FEN - ivf, lytes ok. FLD VTE prophylaxis - scds, lovenox ID - zosyn  Disposition: Advance to soft diet, nutrition consult for low fiber diet teaching, PT assessment since patient had a significant fall shortly before admission, if does well this morning we will plan for discharge later this  afternoon.  Discussed outpatient follow-up with his gastroenterologist Dr. Michail Sermon will need outpt colonoscopy; followed by appointment with one of our colorectal surgeons.  Discussed again dietary progression over the next week or so.  Convert to oral antibiotics on discharge  LOS: 3 days    Leighton Ruff. Redmond Pulling, MD, FACS General, Bariatric, & Minimally Invasive Surgery [phone] [facility]

## 2020-12-21 NOTE — TOC Transition Note (Signed)
Transition of Care [facility]) - CM/SW Discharge Note  Patient Details  Name: ACETON KINNEAR MRN: [id number] Date of Birth: 1945/12/22  Transition of Care [facility]) CM/SW Contact:  Sherie Don, LCSW Phone Number: 12/21/2020, 2:23 PM  Clinical Narrative: Patient will need an OPPT referral. CSW ordered OPPT referral to [facility] to be cosigned by attending physician. TOC signing off.   Final next level of care: OP Rehab Barriers to Discharge: Barriers Resolved  Discharge Plan and Services       DME Arranged: N/A DME Agency: NA  Readmission Risk Interventions No flowsheet data found.

## 2020-12-22 NOTE — Plan of Care (Signed)
°  Problem: Education: Goal: Knowledge of General Education information will improve Description: Including pain rating scale, medication(s)/side effects and non-pharmacologic comfort measures Outcome: Adequate for Discharge   Problem: Health Behavior/Discharge Planning: Goal: Ability to manage health-related needs will improve Outcome: Adequate for Discharge   Problem: Clinical Measurements: Goal: Ability to maintain clinical measurements within normal limits will improve Outcome: Adequate for Discharge Goal: Will remain free from infection Outcome: Adequate for Discharge Goal: Diagnostic test results will improve Outcome: Adequate for Discharge Goal: Respiratory complications will improve Outcome: Adequate for Discharge Goal: Cardiovascular complication will be avoided Outcome: Adequate for Discharge   Problem: Activity: Goal: Risk for activity intolerance will decrease Outcome: Adequate for Discharge   Problem: Nutrition: Goal: Adequate nutrition will be maintained Outcome: Adequate for Discharge   Problem: Coping: Goal: Level of anxiety will decrease Outcome: Adequate for Discharge   Problem: Elimination: Goal: Will not experience complications related to bowel motility Outcome: Adequate for Discharge Goal: Will not experience complications related to urinary retention Outcome: Adequate for Discharge   Problem: Pain Managment: Goal: General experience of comfort will improve Outcome: Adequate for Discharge   Problem: Safety: Goal: Ability to remain free from injury will improve Outcome: Adequate for Discharge   Problem: Skin Integrity: Goal: Risk for impaired skin integrity will decrease Outcome: Adequate for Discharge   Problem: Inadequate Intake (NI-2.1) Goal: Food and/or nutrient delivery Description: Individualized approach for food/nutrient provision. Outcome: Adequate for Discharge  Pt to be dischanged with wife.  Discharge teaching done and written  information given

## 2020-12-27 ENCOUNTER — Ambulatory Visit: Payer: Medicare PPO | Attending: General Surgery | Admitting: Rehabilitative and Restorative Service Providers"

## 2020-12-27 ENCOUNTER — Encounter: Payer: Self-pay | Admitting: Rehabilitative and Restorative Service Providers"

## 2020-12-27 ENCOUNTER — Other Ambulatory Visit: Payer: Self-pay

## 2020-12-27 DIAGNOSIS — R262 Difficulty in walking, not elsewhere classified: Secondary | ICD-10-CM | POA: Diagnosis not present

## 2020-12-27 DIAGNOSIS — M25561 Pain in right knee: Secondary | ICD-10-CM | POA: Diagnosis not present

## 2020-12-27 DIAGNOSIS — R2689 Other abnormalities of gait and mobility: Secondary | ICD-10-CM | POA: Insufficient documentation

## 2020-12-27 DIAGNOSIS — K5720 Diverticulitis of large intestine with perforation and abscess without bleeding: Secondary | ICD-10-CM | POA: Diagnosis not present

## 2020-12-27 DIAGNOSIS — M6281 Muscle weakness (generalized): Secondary | ICD-10-CM | POA: Diagnosis not present

## 2020-12-27 DIAGNOSIS — G8929 Other chronic pain: Secondary | ICD-10-CM | POA: Diagnosis not present

## 2020-12-27 NOTE — Patient Instructions (Signed)
Access Code: [email or web address] URL: [email or web address] Date: 12/27/2020 Prepared by: Shelby Dubin Aniello Christopoulos  Exercises Seated Piriformis Stretch - 1-2 x daily - 7 x weekly - 1 sets - 2 reps - 20 sec hold Supine Hamstring Stretch with Strap - 1-2 x daily - 7 x weekly - 1 sets - 2 reps - 20 sec hold Supine Bridge - 1 x daily - 7 x weekly - 2 sets - 10 reps Clamshell - 1 x daily - 7 x weekly - 2 sets - 10 reps

## 2020-12-27 NOTE — Therapy (Signed)
[place] @ [address] [place], [place], [address] Phone: [phone]   Fax:  [phone]  Physical Therapy Evaluation  Patient Details  Name: Wesley [name] MRN: [id number] Date of Birth: 03-09-1945 Referring Provider (PT): Dr Greer Pickerel   Encounter Date: 12/27/2020   PT End of Session - 12/27/20 0922     Visit Number 1    Date for PT Re-Evaluation 02/17/21    Authorization Type [employer]    Authorization - Visit Number 1    PT Start Time 0832    PT Stop Time 0915    PT Time Calculation (min) 43 min    Activity Tolerance Patient tolerated treatment well    Behavior During Therapy WFL for tasks assessed/performed             Past Medical History:  Diagnosis Date   Depression    Diverticulosis    Hyperlipidemia    Hypertension    Migraine     Past Surgical History:  Procedure Laterality Date   CYSTOSCOPY WITH INSERTION OF UROLIFT N/A 04/28/2019   Procedure: CYSTOSCOPY WITH INSERTION OF UROLIFT;  Surgeon: Festus Aloe, MD;  Location: [facility];  Service: Urology;  Laterality: N/A;   GANGLION CYST EXCISION     PROSTATE BIOPSY N/A 04/28/2019   Procedure: BIOPSY TRANSRECTAL ULTRASONIC PROSTATE (TUBP);  Surgeon: Festus Aloe, MD;  Location: [facility];  Service: Urology;  Laterality: N/A;    There were no vitals filed for this visit.    Subjective Assessment - 12/27/20 0836     Subjective Pt reports that on Dec 12, he passed out in his home, possibly due to low blood sugar.  States that he fell on his tailbone and the next day, he noted increased back pain and spasms, barely able to walk.  Pt was then hospitalized for diverticulitis with perforation. Pt is currently on a fiber restricted diet. Pt recently traveled to [place], and the syncopal episode started after.  Pt had covid on November 16 and was prescribed anti-viral and had rebound covid following. Pt  currently denies pain in back and abdomen. Pt does feel like he is having some balance problem and some weakness.    Pertinent History Covid + in November 2022, Diverticulitis    Limitations Lifting;Standing    How long can you stand comfortably? feels an initial balance problem when standing    Patient Stated Goals Pt would like to be stronger and have better balance.    Currently in Pain? Yes    Pain Score 3     Pain Location Knee    Pain Orientation Right    Pain Descriptors / Indicators Sharp    Pain Type Chronic pain    Pain Onset More than a month ago    Pain Frequency Intermittent    Aggravating Factors  stairs and kneeling                [facility] PT Assessment - 12/27/20 0001       Assessment   Medical Diagnosis K57.20 Diverticulitis of large intestine with perforation without bleeding    Referring Provider (PT) Dr Greer Pickerel    Hand Dominance Right    Next MD Visit Colorectal followup on 01/02/21    Prior Therapy yes for piriformis issue in August 2022      Precautions   Precautions Fall      Restrictions   Weight Bearing Restrictions No  Balance Screen   Has the patient fallen in the past 6 months Yes    How many times? 2    Has the patient had a decrease in activity level because of a fear of falling?  No    Is the patient reluctant to leave their home because of a fear of falling?  No      Home Environment   Living Environment Private residence    Living Arrangements Spouse/significant other    Type of [place] to enter    Home Layout Two level;Able to live on main level with bedroom/bathroom      Prior Function   Level of Independence Independent   Membership at [facility] since summer 2022, was walking 3 miles per day.   Vocation Part time employment    Building control surveyor, performs weddings    Leisure travel, movies, reading, hiking      Cognition   Overall Cognitive Status Within Functional Limits for  tasks assessed      Observation/Other Assessments   Observations Pt is a pleasant 75 year old A, Ox3    Focus on Therapeutic Outcomes (FOTO)  knee 61%   Predicted FOTO by visit 12 is 69%.     ROM / Strength   AROM / PROM / Strength Strength      Strength   Overall Strength Comments B hip strength of 4-/5, otherwise [facility]      Flexibility   Soft Tissue Assessment /Muscle Length yes    Hamstrings tight Bilat    Piriformis tight Bilat      Transfers   Five time sit to stand comments  14.3 sec with UE pushing up from thighs   pt reports bilat knee pain following, R greater than L     6 Minute Walk- Baseline   6 Minute Walk- Baseline yes    HR (bpm) 86    02 Sat (%RA) 97 %    Modified Borg Scale for Dyspnea 0- Nothing at all      6 Minute walk- Post Test   6 Minute Walk Post Test yes    HR (bpm) 101    02 Sat (%RA) 98 %    Modified Borg Scale for Dyspnea 0- Nothing at all      6 minute walk test results    Aerobic Endurance Distance Walked 1086      Standardized Balance Assessment   Standardized Balance Assessment Berg Balance Test      Berg Balance Test   Sit to Stand Able to stand  independently using hands    Standing Unsupported Able to stand safely 2 minutes    Sitting with Back Unsupported but Feet Supported on Floor or Stool Able to sit safely and securely 2 minutes    Stand to Sit Sits safely with minimal use of hands    Transfers Able to transfer safely, minor use of hands    Standing Unsupported with Eyes Closed Able to stand 10 seconds with supervision    Standing Unsupported with Feet Together Able to place feet together independently and stand for 1 minute with supervision    From Standing, Reach Forward with Outstretched Arm Can reach forward >12 cm safely (5")    From Standing Position, Pick up Object from Floor Unable to pick up shoe, but reaches 2-5 cm (1-2") from shoe and balances independently    From Standing Position, Turn to Look Behind Over each  Shoulder  Looks behind from both sides and weight shifts well    Turn 360 Degrees Able to turn 360 degrees safely in 4 seconds or less    Standing Unsupported, Alternately Place Feet on Step/Stool Able to stand independently and complete 8 steps >20 seconds    Standing Unsupported, One Foot in Front Able to place foot tandem independently and hold 30 seconds    Standing on One Leg Able to lift leg independently and hold equal to or more than 3 seconds    Total Score 47                        Objective measurements completed on examination: See above findings.                PT Education - 12/27/20 0921     Education Details Pt provided with HEP    Person(s) Educated Patient    Methods Explanation;Demonstration;Handout    Comprehension Verbalized understanding              PT Short Term Goals - 12/27/20 1005       PT SHORT TERM GOAL #1   Title Ind with initial HEP    Time 3    Period Weeks    Status New               PT Long Term Goals - 12/27/20 1005       PT LONG TERM GOAL #1   Title Pt will be ind with advanced HEP and walking for exercise at least 3x/week to improve overall mobility and strength for tolerance of daily tasks.    Time 8    Period Weeks    Status New      PT LONG TERM GOAL #2   Title Bil LE flexibility to [facility] to reduce undue strain on spine and hips.    Time 8    Period Weeks    Status New      PT LONG TERM GOAL #3   Title Increase B hip strength to at least 4+/5 for improved stability and balance.    Time 8    Period Weeks    Status New      PT LONG TERM GOAL #4   Title Increase BERG to 54/56 to decrease risk of falling.    Time 8    Period Weeks    Status New      PT LONG TERM GOAL #5   Title Increase FOTO for knees to 69% to indicate improved independence with functional mobility.    Time 8    Period Weeks    Status New                    Plan - 12/27/20 0934     Clinical Impression  Statement Pt is a 75 y.o. male referred from Dr Greer Pickerel for outpatient PT secondary to weakness following hospitalization due to diverticulitis. Pts PLOF was able to perform exercising at [facility] and ambulate for 3 miles without difficulty and travel to [place] with his wife. Pt reports that since his syncopal episode and diverdiculitis, he is having difficulty negotiating steps and can no longer perform reciprocal pattern and must utilize step to pattern with UE support of rail.  Pt presents with B hip weakness with decreased standing balance and tight piriformis and hamstrings. On BERG, pt with most difficulty with reaching down to pick up object and with single leg  stance with score of 47/56 to place pt at a risk of falling. Pt denies any falls since his syncopal episode on in early December, but does report that he is feeling unsteady, especially upon initially standing. Pt would benefit from skilled PT to address his functional impairments to allow him to perform functional activities without difficulty.    Personal Factors and Comorbidities Age;Comorbidity 3+    Comorbidities OA, Covid + in 11/22, diverticulitis, recent syncopal episode    Examination-Activity Limitations Bend;Stairs    Examination-Participation Restrictions Yard Work    Stability/Clinical Decision Making Evolving/Moderate complexity    Clinical Decision Making Moderate    Rehab Potential Good    PT Frequency 2x / week    PT Duration 8 weeks    PT Treatment/Interventions ADLs/Self Care Home Management;Aquatic Therapy;Cryotherapy;Electrical Stimulation;Iontophoresis 4mg /ml Dexamethasone;Moist Heat;Traction;Gait training;Stair training;Functional mobility training;Therapeutic activities;Therapeutic exercise;Balance training;Neuromuscular re-education;Patient/family education;Manual techniques;Passive range of motion;Dry needling;Taping;Vasopneumatic Device;Spinal Manipulations;Joint Manipulations    PT Next Visit Plan assess and  progress HEP as indicated, strengthening, balance    PT Home Exercise Plan Access Code: [email or web address]    Consulted and Agree with Plan of Care Patient             Patient will benefit from skilled therapeutic intervention in order to improve the following deficits and impairments:  Difficulty walking, Increased muscle spasms, Pain, Impaired flexibility, Decreased balance, Decreased strength  Visit Diagnosis: Muscle weakness (generalized) - Plan: PT plan of care cert/re-cert  Difficulty in walking, not elsewhere classified - Plan: PT plan of care cert/re-cert  Chronic pain of right knee - Plan: PT plan of care cert/re-cert  Balance problem - Plan: PT plan of care cert/re-cert     Problem List Patient Active Problem List   Diagnosis Date Noted   Diverticulitis of colon with perforation 12/18/2020   Bruxism 05/07/2017   Laryngopharyngeal reflux (LPR) 05/07/2017   Presbycusis of both ears 05/07/2017   Hemorrhoids, external, thrombosed 02/13/2012    Wesley [name], PT, DPT 12/27/2020, 10:34 AM  [place] @ [address] [place], [place], [address] Phone: [phone]   Fax:  [phone]  Name: Wesley [name] MRN: [id number] Date of Birth: [birth year]

## 2021-01-03 ENCOUNTER — Ambulatory Visit: Payer: Medicare PPO | Attending: General Surgery | Admitting: Rehabilitative and Restorative Service Providers"

## 2021-01-03 ENCOUNTER — Other Ambulatory Visit: Payer: Self-pay

## 2021-01-03 ENCOUNTER — Encounter: Payer: Self-pay | Admitting: Rehabilitative and Restorative Service Providers"

## 2021-01-03 DIAGNOSIS — M6281 Muscle weakness (generalized): Secondary | ICD-10-CM | POA: Diagnosis not present

## 2021-01-03 DIAGNOSIS — G8929 Other chronic pain: Secondary | ICD-10-CM | POA: Diagnosis not present

## 2021-01-03 DIAGNOSIS — R262 Difficulty in walking, not elsewhere classified: Secondary | ICD-10-CM | POA: Diagnosis not present

## 2021-01-03 DIAGNOSIS — M25561 Pain in right knee: Secondary | ICD-10-CM | POA: Diagnosis not present

## 2021-01-03 DIAGNOSIS — K5720 Diverticulitis of large intestine with perforation and abscess without bleeding: Secondary | ICD-10-CM | POA: Diagnosis not present

## 2021-01-03 DIAGNOSIS — R2689 Other abnormalities of gait and mobility: Secondary | ICD-10-CM | POA: Diagnosis not present

## 2021-01-03 NOTE — Therapy (Signed)
[facility] @ [address] [place], [place], [address] Phone: [phone]   Fax:  [phone]  Physical Therapy Treatment  Patient Details  Name: Wesley [name] MRN: [id number] Date of Birth: 1945/10/18 Referring Provider (PT): Dr Greer Pickerel   Encounter Date: 01/03/2021   PT End of Session - 01/03/21 1409     Visit Number 2    Date for PT Re-Evaluation 02/17/21    Authorization Type [employer]    Authorization - Visit Number 2    PT Start Time 1400    PT Stop Time 1440    PT Time Calculation (min) 40 min    Activity Tolerance Patient tolerated treatment well    Behavior During Therapy WFL for tasks assessed/performed             Past Medical History:  Diagnosis Date   Depression    Diverticulosis    Hyperlipidemia    Hypertension    Migraine     Past Surgical History:  Procedure Laterality Date   CYSTOSCOPY WITH INSERTION OF UROLIFT N/A 04/28/2019   Procedure: CYSTOSCOPY WITH INSERTION OF UROLIFT;  Surgeon: Festus Aloe, MD;  Location: [facility];  Service: Urology;  Laterality: N/A;   GANGLION CYST EXCISION     PROSTATE BIOPSY N/A 04/28/2019   Procedure: BIOPSY TRANSRECTAL ULTRASONIC PROSTATE (TUBP);  Surgeon: Festus Aloe, MD;  Location: [facility];  Service: Urology;  Laterality: N/A;    There were no vitals filed for this visit.   Subjective Assessment - 01/03/21 1404     Subjective Pt reports that he is having diffiuclty with steps and needs to use step to pattern.    Patient Stated Goals Pt would like to be stronger and have better balance.    Currently in Pain? No/denies                               [facility] Adult PT Treatment/Exercise - 01/03/21 0001       Exercises   Exercises Knee/Hip      Knee/Hip Exercises: Stretches   Passive Hamstring Stretch Both;2 reps;20 seconds    Passive Hamstring Stretch Limitations in supine  with strap    Piriformis Stretch Both;2 reps;20 seconds    Piriformis Stretch Limitations in hooklying      Knee/Hip Exercises: Aerobic   Nustep L3 x6 minutes, PT present to discuss status/progress.      Knee/Hip Exercises: Standing   Hip Flexion Stengthening;Both;1 set;10 reps    Hip Flexion Limitations 2#    Hip Abduction Stengthening;Both;1 set;10 reps    Abduction Limitations 2#    Hip Extension Stengthening;Both;1 set;10 reps    Extension Limitations 2#    Forward Step Up Both;1 set;10 reps;Hand Hold: 2;Step Height: 6"    Other Standing Knee Exercises side stepping with green loop 2x8ft B      Knee/Hip Exercises: Seated   Long Arc Quad Strengthening;Both;2 sets;10 reps    Long Arc Quad Weight 2 lbs.    Clamshell with TheraBand Red   2x10   Marching Strengthening;Both;2 sets;10 reps    Marching Weights 2 lbs.    Hamstring Curl Strengthening;Both;2 sets;10 reps    Hamstring Limitations red tband    Abduction/Adduction  Strengthening;Both;2 sets;10 reps    Abd/Adduction Limitations hip abduction scissors 2#    Sit to Sand 10 reps;with UE support  PT Short Term Goals - 01/03/21 1447       PT SHORT TERM GOAL #1   Title Ind with initial HEP    Status On-going               PT Long Term Goals - 12/27/20 1005       PT LONG TERM GOAL #1   Title Pt will be ind with advanced HEP and walking for exercise at least 3x/week to improve overall mobility and strength for tolerance of daily tasks.    Time 8    Period Weeks    Status New      PT LONG TERM GOAL #2   Title Bil LE flexibility to [facility] to reduce undue strain on spine and hips.    Time 8    Period Weeks    Status New      PT LONG TERM GOAL #3   Title Increase B hip strength to at least 4+/5 for improved stability and balance.    Time 8    Period Weeks    Status New      PT LONG TERM GOAL #4   Title Increase BERG to 54/56 to decrease risk of falling.    Time 8    Period  Weeks    Status New      PT LONG TERM GOAL #5   Title Increase FOTO for knees to 69% to indicate improved independence with functional mobility.    Time 8    Period Weeks    Status New                   Plan - 01/03/21 1445     Clinical Impression Statement Pt tolerated session well.  He states that he forgot to take his HEP out of town with him, so he will start working on it now. Pt reports some fatigue at the end of session, but was able to perform without recovery periods. Pt requires some cuing during stretching for technique and body mechanics, but is able to correct form with cuing. Pt continues to require skilled PT to continue to progress towards goal related activities.    Personal Factors and Comorbidities Age;Comorbidity 3+    Comorbidities OA, Covid + in 11/22, diverticulitis, recent syncopal episode    PT Treatment/Interventions ADLs/Self Care Home Management;Aquatic Therapy;Cryotherapy;Electrical Stimulation;Iontophoresis 4mg /ml Dexamethasone;Moist Heat;Traction;Gait training;Stair training;Functional mobility training;Therapeutic activities;Therapeutic exercise;Balance training;Neuromuscular re-education;Patient/family education;Manual techniques;Passive range of motion;Dry needling;Taping;Vasopneumatic Device;Spinal Manipulations;Joint Manipulations    PT Next Visit Plan assess and progress HEP as indicated, strengthening, balance    PT Home Exercise Plan Access Code: [email or web address]    Consulted and Agree with Plan of Care Patient             Patient will benefit from skilled therapeutic intervention in order to improve the following deficits and impairments:  Difficulty walking, Increased muscle spasms, Pain, Impaired flexibility, Decreased balance, Decreased strength  Visit Diagnosis: Muscle weakness (generalized)  Difficulty in walking, not elsewhere classified  Chronic pain of right knee  Balance problem     Problem List Patient Active Problem List    Diagnosis Date Noted   Diverticulitis of colon with perforation 12/18/2020   Bruxism 05/07/2017   Laryngopharyngeal reflux (LPR) 05/07/2017   Presbycusis of both ears 05/07/2017   Hemorrhoids, external, thrombosed 02/13/2012    Juel Burrow, PT, DPT 01/03/2021, 2:48 PM  [facility] @ [address], [place], [address] Phone: [phone]  Fax:  [phone]  Name: ABRAR BILTON MRN: [id number] Date of Birth: 01-08-1945

## 2021-01-05 ENCOUNTER — Ambulatory Visit: Payer: Medicare PPO | Admitting: Rehabilitative and Restorative Service Providers"

## 2021-01-05 ENCOUNTER — Encounter: Payer: Self-pay | Admitting: Rehabilitative and Restorative Service Providers"

## 2021-01-05 ENCOUNTER — Other Ambulatory Visit: Payer: Self-pay

## 2021-01-05 DIAGNOSIS — M25561 Pain in right knee: Secondary | ICD-10-CM | POA: Diagnosis not present

## 2021-01-05 DIAGNOSIS — R2689 Other abnormalities of gait and mobility: Secondary | ICD-10-CM

## 2021-01-05 DIAGNOSIS — G8929 Other chronic pain: Secondary | ICD-10-CM | POA: Diagnosis not present

## 2021-01-05 DIAGNOSIS — M6281 Muscle weakness (generalized): Secondary | ICD-10-CM

## 2021-01-05 DIAGNOSIS — R262 Difficulty in walking, not elsewhere classified: Secondary | ICD-10-CM

## 2021-01-05 NOTE — Therapy (Signed)
[facility] @ [address] [place], [place], [address] Phone: [phone]   Fax:  [phone]  Physical Therapy Treatment  Patient Details  Name: Wesley [name] MRN: [id number] Date of Birth: [birth year] Referring Provider (PT): Dr Greer Pickerel   Encounter Date: 01/05/2021   PT End of Session - 01/05/21 0851     Visit Number 3    Date for PT Re-Evaluation 02/17/21    Authorization Type [employer]    Authorization Time Period 12/27/20-02/17/21    Authorization - Visit Number 3    Authorization - Number of Visits 16    Progress Note Due on Visit 10    PT Start Time 0845    PT Stop Time 0925    PT Time Calculation (min) 40 min    Activity Tolerance Patient tolerated treatment well    Behavior During Therapy WFL for tasks assessed/performed             Past Medical History:  Diagnosis Date   Depression    Diverticulosis    Hyperlipidemia    Hypertension    Migraine     Past Surgical History:  Procedure Laterality Date   CYSTOSCOPY WITH INSERTION OF UROLIFT N/A 04/28/2019   Procedure: CYSTOSCOPY WITH INSERTION OF UROLIFT;  Surgeon: Festus Aloe, MD;  Location: [facility];  Service: Urology;  Laterality: N/A;   GANGLION CYST EXCISION     PROSTATE BIOPSY N/A 04/28/2019   Procedure: BIOPSY TRANSRECTAL ULTRASONIC PROSTATE (TUBP);  Surgeon: Festus Aloe, MD;  Location: [facility];  Service: Urology;  Laterality: N/A;    There were no vitals filed for this visit.   Subjective Assessment - 01/05/21 0850     Subjective Pt reporting slight back pain today. States that he believes that it is from when he fell prior to PT.    Pertinent History Covid + in November 2022, Diverticulitis    Patient Stated Goals Pt would like to be stronger and have better balance.    Currently in Pain? Yes    Pain Score 3     Pain Location Back    Pain Orientation Lower    Pain Descriptors /  Indicators Sharp                               [facility] Adult PT Treatment/Exercise - 01/05/21 0001       Knee/Hip Exercises: Stretches   Passive Hamstring Stretch Both;2 reps;20 seconds    Passive Hamstring Stretch Limitations in supine with strap    Other Knee/Hip Stretches lower trunk rotation 10 sec x 5 reps B      Knee/Hip Exercises: Aerobic   Nustep L4 x7 minutes, PT present to discuss status/progress.   483 steps     Knee/Hip Exercises: Standing   Hip Abduction Stengthening;Both;1 set;10 reps    Abduction Limitations 2#      Knee/Hip Exercises: Seated   Long Arc Quad Strengthening;Both;2 sets;10 reps    Long Arc Quad Weight 2 lbs.    Other Seated Knee/Hip Exercises 3 way green physioball rollout x5 each with 5 sec hold    Marching Strengthening;Both;2 sets;10 reps    Marching Weights 2 lbs.    Abduction/Adduction  Strengthening;Both;2 sets;10 reps    Abd/Adduction Limitations hip abduction scissors 2#    Sit to Sand 10 reps;without UE support   seated on blue foam  PT Short Term Goals - 01/05/21 1030       PT SHORT TERM GOAL #1   Title Ind with initial HEP    Status Partially Met               PT Long Term Goals - 01/05/21 1031       PT LONG TERM GOAL #1   Title Pt will be ind with advanced HEP and walking for exercise at least 3x/week to improve overall mobility and strength for tolerance of daily tasks.    Status On-going      PT LONG TERM GOAL #2   Title Bil LE flexibility to [facility] to reduce undue strain on spine and hips.    Status On-going      PT LONG TERM GOAL #3   Title Increase B hip strength to at least 4+/5 for improved stability and balance.    Status On-going      PT LONG TERM GOAL #4   Title Increase BERG to 54/56 to decrease risk of falling.    Status On-going      PT LONG TERM GOAL #5   Title Increase FOTO for knees to 69% to indicate improved independence with functional mobility.     Status On-going                   Plan - 01/05/21 7340     Clinical Impression Statement Pt continues to tolerate skilled ther ex well, he states improved compliance with HEP.  Pt  without fatigue noted during session today and reported that his lumbar pain decreased following stretching. Pt continues to require occasional cuing for posture during standing ther ex. Pt  continues to require skilled PT to progress towards goal related activities.    Comorbidities OA, Covid + in 11/22, diverticulitis, recent syncopal episode    PT Treatment/Interventions ADLs/Self Care Home Management;Aquatic Therapy;Cryotherapy;Electrical Stimulation;Iontophoresis 24m/ml Dexamethasone;Moist Heat;Traction;Gait training;Stair training;Functional mobility training;Therapeutic activities;Therapeutic exercise;Balance training;Neuromuscular re-education;Patient/family education;Manual techniques;Passive range of motion;Dry needling;Taping;Vasopneumatic Device;Spinal Manipulations;Joint Manipulations    PT Next Visit Plan assess and progress HEP as indicated, strengthening, balance    Consulted and Agree with Plan of Care Patient             Patient will benefit from skilled therapeutic intervention in order to improve the following deficits and impairments:  Difficulty walking, Increased muscle spasms, Pain, Impaired flexibility, Decreased balance, Decreased strength  Visit Diagnosis: Muscle weakness (generalized)  Difficulty in walking, not elsewhere classified  Chronic pain of right knee  Balance problem     Problem List Patient Active Problem List   Diagnosis Date Noted   Diverticulitis of colon with perforation 12/18/2020   Bruxism 05/07/2017   Laryngopharyngeal reflux (LPR) 05/07/2017   Presbycusis of both ears 05/07/2017   Hemorrhoids, external, thrombosed 02/13/2012    SJuel Burrow PT, DPT 01/05/2021, 10:32 AM  [place]  [place] [address]Phone: [phone]  Fax:  [phone] Name: Wesley PILEGGIMRN: [id number] of Birth: 103-27-1947

## 2021-01-10 ENCOUNTER — Encounter: Payer: Self-pay | Admitting: Rehabilitative and Restorative Service Providers"

## 2021-01-10 ENCOUNTER — Other Ambulatory Visit: Payer: Self-pay

## 2021-01-10 ENCOUNTER — Ambulatory Visit: Payer: Medicare PPO | Admitting: Rehabilitative and Restorative Service Providers"

## 2021-01-10 DIAGNOSIS — M6281 Muscle weakness (generalized): Secondary | ICD-10-CM

## 2021-01-10 DIAGNOSIS — G8929 Other chronic pain: Secondary | ICD-10-CM | POA: Diagnosis not present

## 2021-01-10 DIAGNOSIS — R2689 Other abnormalities of gait and mobility: Secondary | ICD-10-CM

## 2021-01-10 DIAGNOSIS — M25561 Pain in right knee: Secondary | ICD-10-CM

## 2021-01-10 DIAGNOSIS — R262 Difficulty in walking, not elsewhere classified: Secondary | ICD-10-CM

## 2021-01-10 NOTE — Therapy (Signed)
[place] @ [place], [place], [address] Phone: [phone]   Fax:  [phone]  Physical Therapy Treatment  Patient Details  Name: Wesley [name] MRN: [id number] Date of Birth: April 07, 1945 Referring Provider (PT): Dr Greer Pickerel   Encounter Date: 01/10/2021   PT End of Session - 01/10/21 0852     Visit Number 4    Date for PT Re-Evaluation 02/17/21    Authorization Type [employer]    Authorization Time Period 12/27/20-02/17/21    Authorization - Visit Number 4    Authorization - Number of Visits 16    Progress Note Due on Visit 10    PT Start Time 0845    PT Stop Time 0925    PT Time Calculation (min) 40 min    Activity Tolerance Patient tolerated treatment well    Behavior During Therapy WFL for tasks assessed/performed             Past Medical History:  Diagnosis Date   Depression    Diverticulosis    Hyperlipidemia    Hypertension    Migraine     Past Surgical History:  Procedure Laterality Date   CYSTOSCOPY WITH INSERTION OF UROLIFT N/A 04/28/2019   Procedure: CYSTOSCOPY WITH INSERTION OF UROLIFT;  Surgeon: Festus Aloe, MD;  Location: [place];  Service: Urology;  Laterality: N/A;   GANGLION CYST EXCISION     PROSTATE BIOPSY N/A 04/28/2019   Procedure: BIOPSY TRANSRECTAL ULTRASONIC PROSTATE (TUBP);  Surgeon: Festus Aloe, MD;  Location: [facility];  Service: Urology;  Laterality: N/A;    There were no vitals filed for this visit.   Subjective Assessment - 01/10/21 0851     Subjective Pt reports that he has been having some abdomenal pain and he is going to call his surgeon for a recheck.    Pertinent History Covid + in November 2022, Diverticulitis    Patient Stated Goals Pt would like to be stronger and have better balance.    Currently in Pain? Yes    Pain Score 4     Pain Location Abdomen    Pain Orientation Left    Pain  Descriptors / Indicators Sharp    Pain Type Acute pain                               [facility] Adult PT Treatment/Exercise - 01/10/21 0001       Transfers   Five time sit to stand comments  12.8 sec with UE assist      Knee/Hip Exercises: Stretches   Passive Hamstring Stretch Both;2 reps;20 seconds    Passive Hamstring Stretch Limitations in sitting    Piriformis Stretch Both;2 reps;20 seconds    Piriformis Stretch Limitations in sitting      Knee/Hip Exercises: Aerobic   Nustep L6 x6 min with PT present to discuss progress      Knee/Hip Exercises: Machines for Strengthening   Other Machine Lat Pull 25# 2x10      Knee/Hip Exercises: Standing   Heel Raises Both;1 set;15 reps    Hip Flexion Stengthening;Both;1 set;15 reps    Hip Flexion Limitations yellow loop    Hip Abduction Stengthening;Both;1 set;15 reps    Abduction Limitations yellow loop    Hip Extension Stengthening;Both;1 set    Extension Limitations yellow loop    Forward Step Up Both;1 set;10 reps;Hand Hold: 0;Step Height:  6"    Walking with Sports Cord 10# resisted walking x10      Knee/Hip Exercises: Seated   Long Arc Quad Strengthening;Both;1 set;15 reps    Long Arc Quad Weight 3 lbs.    Marching Strengthening;Both;1 set;15 reps    Marching Weights 3 lbs.    Abduction/Adduction  Strengthening;Both;1 set;15 reps    Abd/Adduction Limitations hip abduction scissors 3#    Sit to Sand 10 reps;with UE support                       PT Short Term Goals - 01/10/21 9024       PT SHORT TERM GOAL #1   Title Ind with initial HEP    Status Achieved               PT Long Term Goals - 01/10/21 0973       PT LONG TERM GOAL #1   Title Pt will be ind with advanced HEP and walking for exercise at least 3x/week to improve overall mobility and strength for tolerance of daily tasks.    Status On-going      PT LONG TERM GOAL #2   Title Bil LE flexibility to [facility] to reduce undue strain  on spine and hips.    Status On-going      PT LONG TERM GOAL #3   Title Increase B hip strength to at least 4+/5 for improved stability and balance.    Status On-going                   Plan - 01/10/21 0925     Clinical Impression Statement Despite having increased abdomenal pain, pt tolerates PT session well and without increased abdomenal pain. Pt tolerated increased weights today without complaints. Pt is only requires min cuing with ther ex to improve postural alignment. Pt educated to follow up with his surgeon to notify of his increased abdomenal pain, and he verbalizes understanding.    PT Treatment/Interventions ADLs/Self Care Home Management;Aquatic Therapy;Cryotherapy;Electrical Stimulation;Iontophoresis 4mg /ml Dexamethasone;Moist Heat;Traction;Gait training;Stair training;Functional mobility training;Therapeutic activities;Therapeutic exercise;Balance training;Neuromuscular re-education;Patient/family education;Manual techniques;Passive range of motion;Dry needling;Taping;Vasopneumatic Device;Spinal Manipulations;Joint Manipulations    PT Next Visit Plan assess and progress HEP as indicated, strengthening, balance    Consulted and Agree with Plan of Care Patient             Patient will benefit from skilled therapeutic intervention in order to improve the following deficits and impairments:  Difficulty walking, Increased muscle spasms, Pain, Impaired flexibility, Decreased balance, Decreased strength  Visit Diagnosis: Muscle weakness (generalized)  Difficulty in walking, not elsewhere classified  Chronic pain of right knee  Balance problem     Problem List Patient Active Problem List   Diagnosis Date Noted   Diverticulitis of colon with perforation 12/18/2020   Bruxism 05/07/2017   Laryngopharyngeal reflux (LPR) 05/07/2017   Presbycusis of both ears 05/07/2017   Hemorrhoids, external, thrombosed 02/13/2012    Juel Burrow, PT, DPT 01/10/2021, 9:29  AM  [place] @ [place], [place], [address] Phone: [phone]   Fax:  [phone]  Name: JONIEL GRAUMANN MRN: [id number] Date of Birth: 1945/11/16

## 2021-01-12 ENCOUNTER — Ambulatory Visit: Payer: Medicare PPO | Admitting: Rehabilitative and Restorative Service Providers"

## 2021-01-12 ENCOUNTER — Other Ambulatory Visit: Payer: Self-pay | Admitting: Gastroenterology

## 2021-01-12 ENCOUNTER — Other Ambulatory Visit: Payer: Self-pay

## 2021-01-12 ENCOUNTER — Encounter: Payer: Self-pay | Admitting: Rehabilitative and Restorative Service Providers"

## 2021-01-12 DIAGNOSIS — M6281 Muscle weakness (generalized): Secondary | ICD-10-CM

## 2021-01-12 DIAGNOSIS — G8929 Other chronic pain: Secondary | ICD-10-CM

## 2021-01-12 DIAGNOSIS — K5720 Diverticulitis of large intestine with perforation and abscess without bleeding: Secondary | ICD-10-CM

## 2021-01-12 DIAGNOSIS — R262 Difficulty in walking, not elsewhere classified: Secondary | ICD-10-CM

## 2021-01-12 DIAGNOSIS — M25561 Pain in right knee: Secondary | ICD-10-CM | POA: Diagnosis not present

## 2021-01-12 DIAGNOSIS — R2689 Other abnormalities of gait and mobility: Secondary | ICD-10-CM | POA: Diagnosis not present

## 2021-01-12 NOTE — Therapy (Signed)
[place] @ [place], [place], [address] Phone: [phone]   Fax:  [phone]  Physical Therapy Treatment  Patient Details  Name: Wesley [name] MRN: [id number] Date of Birth: 04-11-1945 Referring Provider (PT): Dr Greer Pickerel   Encounter Date: 01/12/2021   PT End of Session - 01/12/21 0935     Visit Number 5    Date for PT Re-Evaluation 02/17/21    Authorization Type [employer]    Authorization Time Period 12/27/20-02/17/21    Authorization - Visit Number 5    Authorization - Number of Visits 16    Progress Note Due on Visit 10    PT Start Time 0845    PT Stop Time 0925    PT Time Calculation (min) 40 min    Activity Tolerance Patient tolerated treatment well    Behavior During Therapy WFL for tasks assessed/performed             Past Medical History:  Diagnosis Date   Depression    Diverticulosis    Hyperlipidemia    Hypertension    Migraine     Past Surgical History:  Procedure Laterality Date   CYSTOSCOPY WITH INSERTION OF UROLIFT N/A 04/28/2019   Procedure: CYSTOSCOPY WITH INSERTION OF UROLIFT;  Surgeon: Festus Aloe, MD;  Location: [place];  Service: Urology;  Laterality: N/A;   GANGLION CYST EXCISION     PROSTATE BIOPSY N/A 04/28/2019   Procedure: BIOPSY TRANSRECTAL ULTRASONIC PROSTATE (TUBP);  Surgeon: Festus Aloe, MD;  Location: [facility];  Service: Urology;  Laterality: N/A;    There were no vitals filed for this visit.   Subjective Assessment - 01/12/21 0854     Subjective Pt with no new complaints    Pertinent History Covid + in November 2022, Diverticulitis    Patient Stated Goals Pt would like to be stronger and have better balance.    Currently in Pain? No/denies                               [facility] Adult PT Treatment/Exercise - 01/12/21 0001       High Level Balance   High Level Balance  Activities Tandem walking      Knee/Hip Exercises: Stretches   Passive Hamstring Stretch Both;2 reps;20 seconds    Passive Hamstring Stretch Limitations in supine with strap    Other Knee/Hip Stretches lower trunk rotation 10 sec x 5 reps B      Knee/Hip Exercises: Aerobic   Nustep L5 x6 min with PT present to discuss progress      Knee/Hip Exercises: Machines for Strengthening   Other Machine Lat Pull 25# 2x15      Knee/Hip Exercises: Standing   Heel Raises Both;1 set;15 reps    Hip Flexion Stengthening;Both;1 set;15 reps    Hip Flexion Limitations yellow loop    Hip Abduction Stengthening;Both;1 set;15 reps    Abduction Limitations yellow loop    Hip Extension Stengthening;Both;1 set    Extension Limitations yellow loop    Forward Step Up Both;1 set;10 reps;Hand Hold: 0;Step Height: 6"    Walking with Sports Cord 10# resisted walking x10      Knee/Hip Exercises: Seated   Sit to Sand 10 reps;with UE support   standing on blue foam     Knee/Hip Exercises: Supine   Bridges Strengthening;Both;2 sets;10 reps  PT Short Term Goals - 01/10/21 2841       PT SHORT TERM GOAL #1   Title Ind with initial HEP    Status Achieved               PT Long Term Goals - 01/12/21 0941       PT LONG TERM GOAL #1   Title Pt will be ind with advanced HEP and walking for exercise at least 3x/week to improve overall mobility and strength for tolerance of daily tasks.    Status On-going      PT LONG TERM GOAL #2   Title Bil LE flexibility to [facility] to reduce undue strain on spine and hips.    Status On-going                   Plan - 01/12/21 0937     Clinical Impression Statement Mr Lipsett continues to make progress towards goal related activities.  Pt without complaints of abdominal pain today. Pt without increased pain during session and has improved technique requiring less cuing. Pt requires skilled PT to progress towards goal related  activiites.    Comorbidities OA, Covid + in 11/22, diverticulitis, recent syncopal episode    PT Treatment/Interventions ADLs/Self Care Home Management;Aquatic Therapy;Cryotherapy;Electrical Stimulation;Iontophoresis 4mg /ml Dexamethasone;Moist Heat;Traction;Gait training;Stair training;Functional mobility training;Therapeutic activities;Therapeutic exercise;Balance training;Neuromuscular re-education;Patient/family education;Manual techniques;Passive range of motion;Dry needling;Taping;Vasopneumatic Device;Spinal Manipulations;Joint Manipulations    PT Next Visit Plan assess and progress HEP as indicated, strengthening, balance    Consulted and Agree with Plan of Care Patient             Patient will benefit from skilled therapeutic intervention in order to improve the following deficits and impairments:  Difficulty walking, Increased muscle spasms, Pain, Impaired flexibility, Decreased balance, Decreased strength  Visit Diagnosis: Muscle weakness (generalized)  Difficulty in walking, not elsewhere classified  Chronic pain of right knee     Problem List Patient Active Problem List   Diagnosis Date Noted   Diverticulitis of colon with perforation 12/18/2020   Bruxism 05/07/2017   Laryngopharyngeal reflux (LPR) 05/07/2017   Presbycusis of both ears 05/07/2017   Hemorrhoids, external, thrombosed 02/13/2012    Juel Burrow, PT, DPT 01/12/2021, 9:42 AM  [place] @ [place], [place], [address] Phone: [phone]   Fax:  [phone]  Name: Wesley [name] MRN: [id number] Date of Birth: April 28, 1945

## 2021-01-17 ENCOUNTER — Encounter: Payer: Medicare PPO | Admitting: Physical Therapy

## 2021-01-19 ENCOUNTER — Encounter: Payer: Self-pay | Admitting: Rehabilitative and Restorative Service Providers"

## 2021-01-19 ENCOUNTER — Ambulatory Visit: Payer: Medicare PPO | Admitting: Rehabilitative and Restorative Service Providers"

## 2021-01-19 ENCOUNTER — Other Ambulatory Visit: Payer: Self-pay

## 2021-01-19 DIAGNOSIS — M6281 Muscle weakness (generalized): Secondary | ICD-10-CM

## 2021-01-19 DIAGNOSIS — G8929 Other chronic pain: Secondary | ICD-10-CM | POA: Diagnosis not present

## 2021-01-19 DIAGNOSIS — M25561 Pain in right knee: Secondary | ICD-10-CM

## 2021-01-19 DIAGNOSIS — R262 Difficulty in walking, not elsewhere classified: Secondary | ICD-10-CM | POA: Diagnosis not present

## 2021-01-19 DIAGNOSIS — R2689 Other abnormalities of gait and mobility: Secondary | ICD-10-CM | POA: Diagnosis not present

## 2021-01-19 NOTE — Therapy (Signed)
[name], [place], [address] Phone: [phone]   Fax:  [phone]  Physical Therapy Treatment  Patient Details  Name: Wesley [name] MRN: [id number] Date of Birth: May 04, 1945 Referring Provider (PT): Dr Greer Pickerel   Encounter Date: 01/19/2021   PT End of Session - 01/19/21 0848     Visit Number 6    Date for PT Re-Evaluation 02/17/21    Authorization Type [employer]    Authorization Time Period 12/27/20-02/17/21    Authorization - Visit Number 6    Authorization - Number of Visits 16    Progress Note Due on Visit 10    PT Start Time 0845    PT Stop Time 0925    PT Time Calculation (min) 40 min    Activity Tolerance Patient tolerated treatment well    Behavior During Therapy WFL for tasks assessed/performed             Past Medical History:  Diagnosis Date   Depression    Diverticulosis    Hyperlipidemia    Hypertension    Migraine     Past Surgical History:  Procedure Laterality Date   CYSTOSCOPY WITH INSERTION OF UROLIFT N/A 04/28/2019   Procedure: CYSTOSCOPY WITH INSERTION OF UROLIFT;  Surgeon: Festus Aloe, MD;  Location: [place];  Service: Urology;  Laterality: N/A;   GANGLION CYST EXCISION     PROSTATE BIOPSY N/A 04/28/2019   Procedure: BIOPSY TRANSRECTAL ULTRASONIC PROSTATE (TUBP);  Surgeon: Festus Aloe, MD;  Location: [facility];  Service: Urology;  Laterality: N/A;    There were no vitals filed for this visit.   Subjective Assessment - 01/19/21 0848     Subjective Pt reports feeling okay.    Patient Stated Goals Pt would like to be stronger and have better balance.    Currently in Pain? No/denies                               [facility] Adult PT Treatment/Exercise - 01/19/21 0001       High Level Balance   High Level Balance Activities Tandem walking      Knee/Hip Exercises: Stretches    Passive Hamstring Stretch Both;2 reps;20 seconds    Passive Hamstring Stretch Limitations in supine with strap    Other Knee/Hip Stretches lower trunk rotation 10 sec x 5 reps B      Knee/Hip Exercises: Aerobic   Nustep L5 x7 min with PT present to discuss progress   524 steps     Knee/Hip Exercises: Machines for Strengthening   Other Machine Lat Pull 30# 2x15.  Rows 25# 2x10.      Knee/Hip Exercises: Standing   Heel Raises Both;1 set;15 reps    Hip Flexion Stengthening;Both;1 set;15 reps    Hip Flexion Limitations yellow loop    Hip Abduction Stengthening;Both;1 set;15 reps    Abduction Limitations yellow loop    Hip Extension Stengthening;Both;1 set    Extension Limitations yellow loop    Lateral Step Up Both;1 set;10 reps;Hand Hold: 2    Lateral Step Up Limitations on bosu    Forward Step Up Both;1 set;10 reps;Hand Hold: 2    Forward Step Up Limitations on bosu    Walking with Sports Cord 20# resisted walking x10      Knee/Hip Exercises: Seated   Sit to Sand 10 reps;without UE support  holding yellow wt ball, standing on blue foam     Knee/Hip Exercises: Supine   Bridges Strengthening;Both;2 sets;10 reps                       PT Short Term Goals - 01/10/21 3893       PT SHORT TERM GOAL #1   Title Ind with initial HEP    Status Achieved               PT Long Term Goals - 01/19/21 1016       PT LONG TERM GOAL #1   Title Pt will be ind with advanced HEP and walking for exercise at least 3x/week to improve overall mobility and strength for tolerance of daily tasks.    Status On-going      PT LONG TERM GOAL #2   Title Bil LE flexibility to [facility] to reduce undue strain on spine and hips.    Status On-going      PT LONG TERM GOAL #3   Title Increase B hip strength to at least 4+/5 for improved stability and balance.    Status On-going      PT LONG TERM GOAL #4   Title Increase BERG to 54/56 to decrease risk of falling.    Status On-going       PT LONG TERM GOAL #5   Title Increase FOTO for knees to 69% to indicate improved independence with functional mobility.    Status On-going                   Plan - 01/19/21 1012     Clinical Impression Statement Wesley [name] reports that he feels that he has made 80% improvements since starting PT. Pt continues to progress with increased strength and is progressing with improved balance. Pt only required occasional use of UE when performing tandem gait and did require UE assist with step ups on bosu. Pt continues to require skilled PT to address his functional impairments to allow him inceased ability to perform functional tasks.    Personal Factors and Comorbidities Age;Comorbidity 3+    Comorbidities OA, Covid + in 11/22, diverticulitis, recent syncopal episode    PT Treatment/Interventions ADLs/Self Care Home Management;Aquatic Therapy;Cryotherapy;Electrical Stimulation;Iontophoresis 4mg /ml Dexamethasone;Moist Heat;Traction;Gait training;Stair training;Functional mobility training;Therapeutic activities;Therapeutic exercise;Balance training;Neuromuscular re-education;Patient/family education;Manual techniques;Passive range of motion;Dry needling;Taping;Vasopneumatic Device;Spinal Manipulations;Joint Manipulations    PT Next Visit Plan assess and progress HEP as indicated, strengthening, balance    Consulted and Agree with Plan of Care Patient             Patient will benefit from skilled therapeutic intervention in order to improve the following deficits and impairments:  Difficulty walking, Increased muscle spasms, Pain, Impaired flexibility, Decreased balance, Decreased strength  Visit Diagnosis: Muscle weakness (generalized)  Difficulty in walking, not elsewhere classified  Chronic pain of right knee  Balance problem     Problem List Patient Active Problem List   Diagnosis Date Noted   Diverticulitis of colon with perforation 12/18/2020   Bruxism 05/07/2017    Laryngopharyngeal reflux (LPR) 05/07/2017   Presbycusis of both ears 05/07/2017   Hemorrhoids, external, thrombosed 02/13/2012    Juel Burrow, PT, DPT 01/19/2021, 10:17 AM  [place] @ [place], [place], [address] Phone: [phone]   Fax:  [phone]  Name: Wesley [name] MRN: [id number] Date of Birth: 1945-01-21

## 2021-01-24 ENCOUNTER — Ambulatory Visit: Payer: Medicare PPO | Admitting: Rehabilitative and Restorative Service Providers"

## 2021-01-24 ENCOUNTER — Other Ambulatory Visit: Payer: Self-pay

## 2021-01-24 ENCOUNTER — Encounter: Payer: Self-pay | Admitting: Rehabilitative and Restorative Service Providers"

## 2021-01-24 DIAGNOSIS — R2689 Other abnormalities of gait and mobility: Secondary | ICD-10-CM | POA: Diagnosis not present

## 2021-01-24 DIAGNOSIS — G8929 Other chronic pain: Secondary | ICD-10-CM

## 2021-01-24 DIAGNOSIS — R262 Difficulty in walking, not elsewhere classified: Secondary | ICD-10-CM

## 2021-01-24 DIAGNOSIS — M6281 Muscle weakness (generalized): Secondary | ICD-10-CM | POA: Diagnosis not present

## 2021-01-24 DIAGNOSIS — M25561 Pain in right knee: Secondary | ICD-10-CM | POA: Diagnosis not present

## 2021-01-24 NOTE — Therapy (Signed)
[place] @ [place], [place], [address] Phone: [phone]   Fax:  [phone]  Physical Therapy Treatment  Patient Details  Name: Wesley [name] MRN: [id number] Date of Birth: [birth year] Referring Provider (PT): Dr Greer Pickerel   Encounter Date: 01/24/2021   PT End of Session - 01/24/21 0850     Visit Number 7    Date for PT Re-Evaluation 02/17/21    Authorization Type [employer]    Authorization Time Period 12/27/20-02/17/21    Authorization - Visit Number 7    Authorization - Number of Visits 16    Progress Note Due on Visit 10    PT Start Time 0845    PT Stop Time 0925    PT Time Calculation (min) 40 min    Activity Tolerance Patient tolerated treatment well    Behavior During Therapy WFL for tasks assessed/performed             Past Medical History:  Diagnosis Date   Depression    Diverticulosis    Hyperlipidemia    Hypertension    Migraine     Past Surgical History:  Procedure Laterality Date   CYSTOSCOPY WITH INSERTION OF UROLIFT N/A 04/28/2019   Procedure: CYSTOSCOPY WITH INSERTION OF UROLIFT;  Surgeon: Festus Aloe, MD;  Location: [place];  Service: Urology;  Laterality: N/A;   GANGLION CYST EXCISION     PROSTATE BIOPSY N/A 04/28/2019   Procedure: BIOPSY TRANSRECTAL ULTRASONIC PROSTATE (TUBP);  Surgeon: Festus Aloe, MD;  Location: [facility];  Service: Urology;  Laterality: N/A;    There were no vitals filed for this visit.   Subjective Assessment - 01/24/21 0849     Subjective I'm feeling much better.  Pt reports seeing his son over the weekend at a play and "my son and his girlfriend said I looked much better."    Pertinent History Covid + in November 2022, Diverticulitis    Patient Stated Goals Pt would like to be stronger and have better balance.    Currently in Pain? Yes    Pain Score 2     Pain Location Knee    Pain  Orientation Right;Left    Pain Descriptors / Indicators Sharp    Pain Type Chronic pain                               [facility] Adult PT Treatment/Exercise - 01/24/21 0001       High Level Balance   High Level Balance Activities Tandem walking      Knee/Hip Exercises: Stretches   Passive Hamstring Stretch Both;2 reps;20 seconds    Passive Hamstring Stretch Limitations in sitting      Knee/Hip Exercises: Aerobic   Nustep L5 x7 min with PT present to discuss progress   530 steps     Knee/Hip Exercises: Standing   Heel Raises Both;1 set;15 reps    Hip Flexion Stengthening;Both;1 set;15 reps    Hip Flexion Limitations yellow loop    Hip Abduction Stengthening;Both;1 set;15 reps    Abduction Limitations yellow loop    Hip Extension Stengthening;Both;1 set    Extension Limitations yellow loop    Lateral Step Up Both;1 set;10 reps;Hand Hold: 2    Lateral Step Up Limitations on bosu    Forward Step Up Both;1 set;10 reps;Hand Hold: 2    Forward Step Up Limitations on bosu  Other Standing Knee Exercises Modified dead lift 5# 2x10.  RDL 5# x10 B      Knee/Hip Exercises: Seated   Sit to Sand 10 reps;without UE support   standing on blue foam holding yellow wt ball.  x10 with chest press, x10 with overhead press.                      PT Short Term Goals - 01/10/21 4287       PT SHORT TERM GOAL #1   Title Ind with initial HEP    Status Achieved               PT Long Term Goals - 01/24/21 0941       PT LONG TERM GOAL #1   Title Pt will be ind with advanced HEP and walking for exercise at least 3x/week to improve overall mobility and strength for tolerance of daily tasks.    Status Partially Met      PT LONG TERM GOAL #2   Title Bil LE flexibility to [facility] to reduce undue strain on spine and hips.    Status On-going      PT LONG TERM GOAL #3   Title Increase B hip strength to at least 4+/5 for improved stability and balance.    Status  On-going      PT LONG TERM GOAL #4   Title Increase BERG to 54/56 to decrease risk of falling.    Status On-going      PT LONG TERM GOAL #5   Title Increase FOTO for knees to 69% to indicate improved independence with functional mobility.    Status On-going                   Plan - 01/24/21 0933     Clinical Impression Statement Mr Raden tolerated progression of strengthening exercises well, though he had the most difficulty with the dead lift exercises.  Pt continues some cuing with ther ex for posture and body mechanics. Pt is progressing with increased strengthening and balance, but continues to require skilled PT to further progress towards increased safety.    Personal Factors and Comorbidities Age;Comorbidity 3+    Comorbidities OA, Covid + in 11/22, diverticulitis, recent syncopal episode    PT Treatment/Interventions ADLs/Self Care Home Management;Aquatic Therapy;Cryotherapy;Electrical Stimulation;Iontophoresis 4m/ml Dexamethasone;Moist Heat;Traction;Gait training;Stair training;Functional mobility training;Therapeutic activities;Therapeutic exercise;Balance training;Neuromuscular re-education;Patient/family education;Manual techniques;Passive range of motion;Dry needling;Taping;Vasopneumatic Device;Spinal Manipulations;Joint Manipulations    PT Next Visit Plan assess and progress HEP as indicated, strengthening, balance    Consulted and Agree with Plan of Care Patient             Patient will benefit from skilled therapeutic intervention in order to improve the following deficits and impairments:  Difficulty walking, Increased muscle spasms, Pain, Impaired flexibility, Decreased balance, Decreased strength  Visit Diagnosis: Muscle weakness (generalized)  Difficulty in walking, not elsewhere classified  Chronic pain of right knee  Balance problem     Problem List Patient Active Problem List   Diagnosis Date Noted   Diverticulitis of colon with  perforation 12/18/2020   Bruxism 05/07/2017   Laryngopharyngeal reflux (LPR) 05/07/2017   Presbycusis of both ears 05/07/2017   Hemorrhoids, external, thrombosed 02/13/2012    SJuel Burrow PT, DPT 01/24/2021, 9:43 AM  [place] [place] [address]Phone: [phone]  Fax:  [phone] Name: Wesley PITTERMRN: [id number]  of Birth: June 09, 1945

## 2021-01-25 DIAGNOSIS — L820 Inflamed seborrheic keratosis: Secondary | ICD-10-CM | POA: Diagnosis not present

## 2021-01-25 DIAGNOSIS — L538 Other specified erythematous conditions: Secondary | ICD-10-CM | POA: Diagnosis not present

## 2021-01-25 DIAGNOSIS — B078 Other viral warts: Secondary | ICD-10-CM | POA: Diagnosis not present

## 2021-01-25 DIAGNOSIS — D485 Neoplasm of uncertain behavior of skin: Secondary | ICD-10-CM | POA: Diagnosis not present

## 2021-01-26 ENCOUNTER — Encounter: Payer: Self-pay | Admitting: Rehabilitative and Restorative Service Providers"

## 2021-01-26 ENCOUNTER — Other Ambulatory Visit: Payer: Self-pay

## 2021-01-26 ENCOUNTER — Ambulatory Visit: Payer: Medicare PPO | Admitting: Rehabilitative and Restorative Service Providers"

## 2021-01-26 DIAGNOSIS — G8929 Other chronic pain: Secondary | ICD-10-CM

## 2021-01-26 DIAGNOSIS — M6281 Muscle weakness (generalized): Secondary | ICD-10-CM | POA: Diagnosis not present

## 2021-01-26 DIAGNOSIS — R2689 Other abnormalities of gait and mobility: Secondary | ICD-10-CM

## 2021-01-26 DIAGNOSIS — R262 Difficulty in walking, not elsewhere classified: Secondary | ICD-10-CM

## 2021-01-26 DIAGNOSIS — M25561 Pain in right knee: Secondary | ICD-10-CM | POA: Diagnosis not present

## 2021-01-26 NOTE — Therapy (Signed)
[facility] @ [place], [place], [address] Phone: [phone]   Fax:  [phone]  Physical Therapy Treatment  Patient Details  Name: Wesley [name] MRN: [id number] Date of Birth: August 06, 1945 Referring Provider (PT): Dr Greer Pickerel   Encounter Date: 01/26/2021   PT End of Session - 01/26/21 0856     Visit Number 8    Date for PT Re-Evaluation 02/17/21    Authorization Type [employer]    Authorization Time Period 12/27/20-02/17/21    Authorization - Visit Number 8    Authorization - Number of Visits 16    Progress Note Due on Visit 10    PT Start Time 0845    PT Stop Time 0925    PT Time Calculation (min) 40 min    Activity Tolerance Patient tolerated treatment well    Behavior During Therapy WFL for tasks assessed/performed             Past Medical History:  Diagnosis Date   Depression    Diverticulosis    Hyperlipidemia    Hypertension    Migraine     Past Surgical History:  Procedure Laterality Date   CYSTOSCOPY WITH INSERTION OF UROLIFT N/A 04/28/2019   Procedure: CYSTOSCOPY WITH INSERTION OF UROLIFT;  Surgeon: Festus Aloe, MD;  Location: [facility];  Service: Urology;  Laterality: N/A;   GANGLION CYST EXCISION     PROSTATE BIOPSY N/A 04/28/2019   Procedure: BIOPSY TRANSRECTAL ULTRASONIC PROSTATE (TUBP);  Surgeon: Festus Aloe, MD;  Location: [facility];  Service: Urology;  Laterality: N/A;    There were no vitals filed for this visit.   Subjective Assessment - 01/26/21 0853     Subjective Pt reports feeling better.  Pt reports that he is feeling at least 80% better.    Pertinent History Covid + in November 2022, Diverticulitis    Patient Stated Goals Pt would like to be stronger and have better balance.    Currently in Pain? Yes    Pain Score 1     Pain Location Knee    Pain Orientation Right    Pain Descriptors / Indicators Sharp     Pain Type Chronic pain    Pain Onset More than a month ago    Pain Frequency Intermittent                [facility] PT Assessment - 01/26/21 0001       Assessment   Medical Diagnosis K57.20 Diverticulitis of large intestine with perforation without bleeding    Referring Provider (PT) Dr Greer Pickerel      Observation/Other Assessments   Focus on Therapeutic Outcomes (FOTO)  77%                           [facility] Adult PT Treatment/Exercise - 01/26/21 0001       Transfers   Five time sit to stand comments  11.1 sec with limited UE pushing up from thighs.      Knee/Hip Exercises: Aerobic   Nustep L5 x7 min with PT present to discuss progress   576 steps     Knee/Hip Exercises: Machines for Strengthening   Cybex Leg Press 70# 2x10, seat at 8.    Other Machine Lat Pull 40# 2x15.  Rows 35# 2x10.      Knee/Hip Exercises: Standing   Lateral Step Up Both;1 set;10 reps;Hand Hold: 0;Step Height:  6"    Forward Step Up Both;1 set;10 reps;Hand Hold: 0;Step Height: 6"    Walking with Sports Cord 20# resisted walking x10    Other Standing Knee Exercises Modified dead lift 5# 2x10.  RDL 5# x10 B      Knee/Hip Exercises: Seated   Sit to Sand 10 reps;without UE support   holding 5# weight:  x10 chest press, x10 with overhead press.                      PT Short Term Goals - 01/10/21 5102       PT SHORT TERM GOAL #1   Title Ind with initial HEP    Status Achieved               PT Long Term Goals - 01/26/21 5852       PT LONG TERM GOAL #1   Title Pt will be ind with advanced HEP and walking for exercise at least 3x/week to improve overall mobility and strength for tolerance of daily tasks.    Status Partially Met      PT LONG TERM GOAL #2   Title Bil LE flexibility to [facility] to reduce undue strain on spine and hips.    Status Partially Met      PT LONG TERM GOAL #3   Title Increase B hip strength to at least 4+/5 for improved stability and balance.     Status Partially Met      PT LONG TERM GOAL #4   Title Increase BERG to 54/56 to decrease risk of falling.    Status On-going      PT LONG TERM GOAL #5   Title Increase FOTO for knees to 69% to indicate improved independence with functional mobility.    Status Achieved                   Plan - 01/26/21 0936     Clinical Impression Statement Mr Inclan continues to tolerate sessions well.  He has met FOTO predicted value and is improving on 5 times sit to/from stand.  Pt continues with most difficulty with dead lift exercises.  Encouraged pt to resume his workouts at [facility] to initiate transition to prepare for discharge from skilled outpatient PT, pt verbalizes his understanding. Pt continues to require skilled PT to progress towards goal related activities.    Personal Factors and Comorbidities Age;Comorbidity 3+    Comorbidities OA, Covid + in 11/22, diverticulitis, recent syncopal episode    PT Treatment/Interventions ADLs/Self Care Home Management;Aquatic Therapy;Cryotherapy;Electrical Stimulation;Iontophoresis 64m/ml Dexamethasone;Moist Heat;Traction;Gait training;Stair training;Functional mobility training;Therapeutic activities;Therapeutic exercise;Balance training;Neuromuscular re-education;Patient/family education;Manual techniques;Passive range of motion;Dry needling;Taping;Vasopneumatic Device;Spinal Manipulations;Joint Manipulations    PT Next Visit Plan assess and progress HEP as indicated, strengthening, balance    Consulted and Agree with Plan of Care Patient             Patient will benefit from skilled therapeutic intervention in order to improve the following deficits and impairments:  Difficulty walking, Increased muscle spasms, Pain, Impaired flexibility, Decreased balance, Decreased strength  Visit Diagnosis: Muscle weakness (generalized)  Difficulty in walking, not elsewhere classified  Chronic pain of right knee  Balance  problem     Problem List Patient Active Problem List   Diagnosis Date Noted   Diverticulitis of colon with perforation 12/18/2020   Bruxism 05/07/2017   Laryngopharyngeal reflux (LPR) 05/07/2017   Presbycusis of both ears 05/07/2017   Hemorrhoids, external, thrombosed 02/13/2012  Juel Burrow, PT, DPT 01/26/2021, 9:39 AM  [facility] @ [place], [place], [address] Phone: [phone]   Fax:  [phone]  Name: TODDY BOYD MRN: [id number] Date of Birth: 1945/07/13

## 2021-01-31 ENCOUNTER — Encounter: Payer: Self-pay | Admitting: Rehabilitative and Restorative Service Providers"

## 2021-01-31 ENCOUNTER — Other Ambulatory Visit: Payer: Self-pay

## 2021-01-31 ENCOUNTER — Ambulatory Visit: Payer: Medicare PPO | Admitting: Rehabilitative and Restorative Service Providers"

## 2021-01-31 DIAGNOSIS — M6281 Muscle weakness (generalized): Secondary | ICD-10-CM | POA: Diagnosis not present

## 2021-01-31 DIAGNOSIS — G8929 Other chronic pain: Secondary | ICD-10-CM | POA: Diagnosis not present

## 2021-01-31 DIAGNOSIS — R262 Difficulty in walking, not elsewhere classified: Secondary | ICD-10-CM

## 2021-01-31 DIAGNOSIS — R2689 Other abnormalities of gait and mobility: Secondary | ICD-10-CM | POA: Diagnosis not present

## 2021-01-31 DIAGNOSIS — M25561 Pain in right knee: Secondary | ICD-10-CM | POA: Diagnosis not present

## 2021-01-31 NOTE — Therapy (Signed)
[name] @ [name] [place], [place], [address] Phone: [phone]   Fax:  [phone]  Physical Therapy Treatment  Patient Details  Name: Wesley [name] MRN: [id number] Date of Birth: 05/21/1945 Referring Provider (PT): Dr [name]   Encounter Date: 01/31/2021   PT End of Session - 01/31/21 0851     Visit Number 9    Date for PT Re-Evaluation 02/17/21    Authorization Type [employer]    Authorization Time Period 12/27/20-02/17/21    Authorization - Visit Number 9    Authorization - Number of Visits 16    Progress Note Due on Visit 10    PT Start Time 0845    PT Stop Time 0925    PT Time Calculation (min) 40 min    Activity Tolerance Patient tolerated treatment well    Behavior During Therapy WFL for tasks assessed/performed             Past Medical History:  Diagnosis Date   Depression    Diverticulosis    Hyperlipidemia    Hypertension    Migraine     Past Surgical History:  Procedure Laterality Date   CYSTOSCOPY WITH INSERTION OF UROLIFT N/A 04/28/2019   Procedure: CYSTOSCOPY WITH INSERTION OF UROLIFT;  Surgeon: Festus Aloe, MD;  Location: [facility];  Service: Urology;  Laterality: N/A;   GANGLION CYST EXCISION     PROSTATE BIOPSY N/A 04/28/2019   Procedure: BIOPSY TRANSRECTAL ULTRASONIC PROSTATE (TUBP);  Surgeon: Festus Aloe, MD;  Location: [facility];  Service: Urology;  Laterality: N/A;    There were no vitals filed for this visit.   Subjective Assessment - 01/31/21 0852     Subjective Pt reports that he woke up with R hip pain.    Pertinent History Covid + in November 2022, Diverticulitis    How long can you stand comfortably? feels an initial balance problem when standing    Patient Stated Goals Pt would like to be stronger and have better balance.    Currently in Pain? Yes    Pain Score 1     Pain Location Knee    Pain Orientation  Right    Pain Descriptors / Indicators Sharp    Pain Type Chronic pain                               [facility] Adult PT Treatment/Exercise - 01/31/21 0001       High Level Balance   High Level Balance Activities Tandem walking      Knee/Hip Exercises: Stretches   Passive Hamstring Stretch Both;2 reps;20 seconds    Passive Hamstring Stretch Limitations in sitting      Knee/Hip Exercises: Aerobic   Nustep L6 x 6 min with PT present to discuss progress   443 steps     Knee/Hip Exercises: Standing   Hip Flexion Stengthening;Both;1 set;15 reps    Hip Flexion Limitations yellow loop    Hip Abduction Stengthening;Both;1 set;15 reps    Abduction Limitations yellow loop    Hip Extension Stengthening;Both;1 set    Extension Limitations yellow loop    Lateral Step Up Both;1 set;10 reps;Hand Hold: 0;Step Height: 6"    Lateral Step Up Limitations on bosu    Forward Step Up Both;1 set;10 reps;Hand Hold: 0;Step Height: 6"    Forward Step Up Limitations on bosu    Walking with  Sports Cord 20# resisted walking x10    Other Standing Knee Exercises Modified dead lift 5# 2x10.  RDL 5# x10 B      Knee/Hip Exercises: Seated   Sit to Sand 10 reps;without UE support                       PT Short Term Goals - 01/10/21 3094       PT SHORT TERM GOAL #1   Title Ind with initial HEP    Status Achieved               PT Long Term Goals - 01/26/21 0768       PT LONG TERM GOAL #1   Title Pt will be ind with advanced HEP and walking for exercise at least 3x/week to improve overall mobility and strength for tolerance of daily tasks.    Status Partially Met      PT LONG TERM GOAL #2   Title Bil LE flexibility to [facility] to reduce undue strain on spine and hips.    Status Partially Met      PT LONG TERM GOAL #3   Title Increase B hip strength to at least 4+/5 for improved stability and balance.    Status Partially Met      PT LONG TERM GOAL #4   Title Increase  BERG to 54/56 to decrease risk of falling.    Status On-going      PT LONG TERM GOAL #5   Title Increase FOTO for knees to 69% to indicate improved independence with functional mobility.    Status Achieved                   Plan - 01/31/21 0881     Clinical Impression Statement Wesley [name] continues to make great progress and anticipate that pt will be ready for discharge next visit, as scheduled.  Pt has memebership to [facility] to continue his progress following discharge from skilled PT. Pt tolerated ther ex well, he does require min cuing during dead lift exercises for cuing to maintain core stability, but otherwise is performing exercises without cuing.    Personal Factors and Comorbidities Age;Comorbidity 3+    Comorbidities OA, Covid + in 11/22, diverticulitis, recent syncopal episode    PT Treatment/Interventions ADLs/Self Care Home Management;Aquatic Therapy;Cryotherapy;Electrical Stimulation;Iontophoresis 73m/ml Dexamethasone;Moist Heat;Traction;Gait training;Stair training;Functional mobility training;Therapeutic activities;Therapeutic exercise;Balance training;Neuromuscular re-education;Patient/family education;Manual techniques;Passive range of motion;Dry needling;Taping;Vasopneumatic Device;Spinal Manipulations;Joint Manipulations    PT Next Visit Plan anticipate discharge    Consulted and Agree with Plan of Care Patient             Patient will benefit from skilled therapeutic intervention in order to improve the following deficits and impairments:  Difficulty walking, Increased muscle spasms, Pain, Impaired flexibility, Decreased balance, Decreased strength  Visit Diagnosis: Muscle weakness (generalized)  Difficulty in walking, not elsewhere classified  Chronic pain of right knee  Balance problem     Problem List Patient Active Problem List   Diagnosis Date Noted   Diverticulitis of colon with perforation 12/18/2020   Bruxism 05/07/2017    Laryngopharyngeal reflux (LPR) 05/07/2017   Presbycusis of both ears 05/07/2017   Hemorrhoids, external, thrombosed 02/13/2012    SJuel Burrow PT, DPT 01/31/2021, 9:30 AM  [email or web address] [place] [address]Phone: [phone]  Fax:  [phone] Name: GCONSUELO THAYNEMRN: [id number] of Birth: 110/25/1947

## 2021-02-02 ENCOUNTER — Other Ambulatory Visit: Payer: Self-pay

## 2021-02-02 ENCOUNTER — Ambulatory Visit: Payer: Medicare PPO | Attending: General Surgery | Admitting: Rehabilitative and Restorative Service Providers"

## 2021-02-02 ENCOUNTER — Encounter: Payer: Self-pay | Admitting: Rehabilitative and Restorative Service Providers"

## 2021-02-02 DIAGNOSIS — M25561 Pain in right knee: Secondary | ICD-10-CM | POA: Insufficient documentation

## 2021-02-02 DIAGNOSIS — M6281 Muscle weakness (generalized): Secondary | ICD-10-CM | POA: Insufficient documentation

## 2021-02-02 DIAGNOSIS — R262 Difficulty in walking, not elsewhere classified: Secondary | ICD-10-CM | POA: Insufficient documentation

## 2021-02-02 DIAGNOSIS — R2689 Other abnormalities of gait and mobility: Secondary | ICD-10-CM | POA: Diagnosis not present

## 2021-02-02 DIAGNOSIS — G8929 Other chronic pain: Secondary | ICD-10-CM | POA: Insufficient documentation

## 2021-02-02 NOTE — Therapy (Signed)
[facility] @ [place], [place], [address] Phone: [phone]   Fax:  [phone]  Physical Therapy Treatment and Discharge Summary  Patient Details  Name: Wesley [name] MRN: [id number] Date of Birth: 1945/09/29 Referring Provider (PT): Dr Greer Pickerel   Encounter Date: 02/02/2021   PT End of Session - 02/02/21 0849     Visit Number 10    Date for PT Re-Evaluation 02/17/21    Authorization Type [employer]    Authorization Time Period 12/27/20-02/17/21    Authorization - Visit Number 10    Authorization - Number of Visits 16    PT Start Time 0845    PT Stop Time 0925    PT Time Calculation (min) 40 min    Activity Tolerance Patient tolerated treatment well    Behavior During Therapy WFL for tasks assessed/performed             Past Medical History:  Diagnosis Date   Depression    Diverticulosis    Hyperlipidemia    Hypertension    Migraine     Past Surgical History:  Procedure Laterality Date   CYSTOSCOPY WITH INSERTION OF UROLIFT N/A 04/28/2019   Procedure: CYSTOSCOPY WITH INSERTION OF UROLIFT;  Surgeon: Festus Aloe, MD;  Location: [facility];  Service: Urology;  Laterality: N/A;   GANGLION CYST EXCISION     PROSTATE BIOPSY N/A 04/28/2019   Procedure: BIOPSY TRANSRECTAL ULTRASONIC PROSTATE (TUBP);  Surgeon: Festus Aloe, MD;  Location: [facility];  Service: Urology;  Laterality: N/A;    There were no vitals filed for this visit.   Subjective Assessment - 02/02/21 0850     Subjective Pt reports that he was at a concert last night and stood for over an hour, he reports some pain this AM.    Pertinent History Covid + in November 2022, Diverticulitis    How long can you stand comfortably? feels an initial balance problem when standing    Patient Stated Goals Pt would like to be stronger and have better balance.    Currently in Pain? Yes    Pain  Score 1     Pain Location Knee    Pain Orientation Right    Pain Type Chronic pain                               [facility] Adult PT Treatment/Exercise - 02/02/21 0001       Standardized Balance Assessment   Standardized Balance Assessment Berg Balance Test      Berg Balance Test   Sit to Stand Able to stand without using hands and stabilize independently    Standing Unsupported Able to stand safely 2 minutes    Sitting with Back Unsupported but Feet Supported on Floor or Stool Able to sit safely and securely 2 minutes    Stand to Sit Sits safely with minimal use of hands    Transfers Able to transfer safely, minor use of hands    Standing Unsupported with Eyes Closed Able to stand 10 seconds safely    Standing Ubsupported with Feet Together Able to place feet together independently and stand 1 minute safely    From Standing, Reach Forward with Outstretched Arm Can reach confidently >25 cm (10")    From Standing Position, Pick up Object from Floor Able to pick up shoe safely and easily    From  Standing Position, Turn to Look Behind Over each Shoulder Looks behind from both sides and weight shifts well    Turn 360 Degrees Able to turn 360 degrees safely in 4 seconds or less    Standing Unsupported, Alternately Place Feet on Step/Stool Able to stand independently and safely and complete 8 steps in 20 seconds    Standing Unsupported, One Foot in Front Able to place foot tandem independently and hold 30 seconds    Standing on One Leg Able to lift leg independently and hold 5-10 seconds    Total Score 55      High Level Balance   High Level Balance Activities Tandem walking      Knee/Hip Exercises: Stretches   Passive Hamstring Stretch Both;2 reps;20 seconds    Passive Hamstring Stretch Limitations in standing with foot on 2nd step      Knee/Hip Exercises: Aerobic   Nustep L6 x 6 min with PT present to discuss progress   447 steps     Knee/Hip Exercises: Machines for  Strengthening   Cybex Leg Press 90# 2x10, seat at 8.    Other Machine Lat Pull 40# 2x15.  Rows 35# 2x10.      Knee/Hip Exercises: Standing   Hip Flexion Stengthening;Both;1 set;15 reps    Hip Flexion Limitations yellow loop    Hip Abduction Stengthening;Both;1 set;15 reps    Abduction Limitations yellow loop    Hip Extension Stengthening;Both;1 set    Extension Limitations yellow loop    Lateral Step Up Both;1 set;10 reps;Hand Hold: 0;Step Height: 6"    Lateral Step Up Limitations on bosu    Forward Step Up Both;1 set;10 reps;Hand Hold: 0;Step Height: 6"    Forward Step Up Limitations on bosu    Walking with Sports Cord 20# resisted walking x10                       PT Short Term Goals - 02/02/21 0853       PT SHORT TERM GOAL #1   Title Ind with initial HEP    Status Achieved               PT Long Term Goals - 02/02/21 0853       PT LONG TERM GOAL #1   Title Pt will be ind with advanced HEP and walking for exercise at least 3x/week to improve overall mobility and strength for tolerance of daily tasks.    Status Achieved      PT LONG TERM GOAL #2   Title Bil LE flexibility to [facility] to reduce undue strain on spine and hips.    Status Achieved      PT LONG TERM GOAL #3   Title Increase B hip strength to at least 4+/5 for improved stability and balance.    Status Achieved      PT LONG TERM GOAL #4   Title Increase BERG to 54/56 to decrease risk of falling.    Status Achieved   55/56     PT LONG TERM GOAL #5   Title Increase FOTO for knees to 69% to indicate improved independence with functional mobility.    Status Achieved                   Plan - 02/02/21 3762     Clinical Impression Statement Wesley [name] has made excellent progress with skilled PT and has met all goals.  Pt discharged from outpatient PT today to  continue with exercise routine at [facility]. Pt demonstrated good posture and body mechanics throughout session and has improved  with his standing balance and activity tolerance.  Pt discharged at this time to continue with HEP and gym.    Personal Factors and Comorbidities Age;Comorbidity 3+    Comorbidities OA, Covid + in 11/22, diverticulitis, recent syncopal episode    PT Treatment/Interventions ADLs/Self Care Home Management;Aquatic Therapy;Cryotherapy;Electrical Stimulation;Iontophoresis 41m/ml Dexamethasone;Moist Heat;Traction;Gait training;Stair training;Functional mobility training;Therapeutic activities;Therapeutic exercise;Balance training;Neuromuscular re-education;Patient/family education;Manual techniques;Passive range of motion;Dry needling;Taping;Vasopneumatic Device;Spinal Manipulations;Joint Manipulations    PT Next Visit Plan discharge completed today.    Consulted and Agree with Plan of Care Patient             Patient will benefit from skilled therapeutic intervention in order to improve the following deficits and impairments:  Difficulty walking, Increased muscle spasms, Pain, Impaired flexibility, Decreased balance, Decreased strength  Visit Diagnosis: Muscle weakness (generalized)  Difficulty in walking, not elsewhere classified  Chronic pain of right knee  Balance problem     Problem List Patient Active Problem List   Diagnosis Date Noted   Diverticulitis of colon with perforation 12/18/2020   Bruxism 05/07/2017   Laryngopharyngeal reflux (LPR) 05/07/2017   Presbycusis of both ears 05/07/2017   Hemorrhoids, external, thrombosed 02/13/2012   PHYSICAL THERAPY DISCHARGE SUMMARY  Patient agrees to discharge. Patient goals were met. Patient is being discharged due to meeting the stated rehab goals.   SJuel Burrow PT, DPT 02/02/2021, 9:29 AM  [email or web address] [place] [address]Phone: [phone]  Fax:  [phone] Name: GJIANNI BATTENMRN: [id number] of Birth: 103-01-1945

## 2021-02-07 ENCOUNTER — Encounter: Payer: Medicare PPO | Admitting: Rehabilitative and Restorative Service Providers"

## 2021-02-09 ENCOUNTER — Encounter: Payer: Medicare PPO | Admitting: Rehabilitative and Restorative Service Providers"

## 2021-02-14 ENCOUNTER — Ambulatory Visit
Admission: RE | Admit: 2021-02-14 | Discharge: 2021-02-14 | Disposition: A | Discharge status: Home / Self Care | Payer: Medicare PPO | Source: Ambulatory Visit | Attending: Gastroenterology | Admitting: Gastroenterology

## 2021-02-14 DIAGNOSIS — K5720 Diverticulitis of large intestine with perforation and abscess without bleeding: Secondary | ICD-10-CM

## 2021-02-14 DIAGNOSIS — K449 Diaphragmatic hernia without obstruction or gangrene: Secondary | ICD-10-CM | POA: Diagnosis not present

## 2021-02-28 DIAGNOSIS — L821 Other seborrheic keratosis: Secondary | ICD-10-CM | POA: Diagnosis not present

## 2021-02-28 DIAGNOSIS — D225 Melanocytic nevi of trunk: Secondary | ICD-10-CM | POA: Diagnosis not present

## 2021-02-28 DIAGNOSIS — R202 Paresthesia of skin: Secondary | ICD-10-CM | POA: Diagnosis not present

## 2021-02-28 DIAGNOSIS — D485 Neoplasm of uncertain behavior of skin: Secondary | ICD-10-CM | POA: Diagnosis not present

## 2021-02-28 DIAGNOSIS — L814 Other melanin hyperpigmentation: Secondary | ICD-10-CM | POA: Diagnosis not present

## 2021-02-28 DIAGNOSIS — L570 Actinic keratosis: Secondary | ICD-10-CM | POA: Diagnosis not present

## 2021-02-28 DIAGNOSIS — D2261 Melanocytic nevi of right upper limb, including shoulder: Secondary | ICD-10-CM | POA: Diagnosis not present

## 2021-03-09 DIAGNOSIS — M25561 Pain in right knee: Secondary | ICD-10-CM | POA: Diagnosis not present

## 2021-03-09 DIAGNOSIS — S22010D Wedge compression fracture of first thoracic vertebra, subsequent encounter for fracture with routine healing: Secondary | ICD-10-CM | POA: Diagnosis not present

## 2021-03-09 DIAGNOSIS — I700 Atherosclerosis of aorta: Secondary | ICD-10-CM | POA: Diagnosis not present

## 2021-03-09 DIAGNOSIS — I10 Essential (primary) hypertension: Secondary | ICD-10-CM | POA: Diagnosis not present

## 2021-03-09 DIAGNOSIS — K8020 Calculus of gallbladder without cholecystitis without obstruction: Secondary | ICD-10-CM | POA: Diagnosis not present

## 2021-03-10 DIAGNOSIS — L0101 Non-bullous impetigo: Secondary | ICD-10-CM | POA: Diagnosis not present

## 2021-03-15 ENCOUNTER — Other Ambulatory Visit: Payer: Self-pay | Admitting: Family Medicine

## 2021-03-27 DIAGNOSIS — K5730 Diverticulosis of large intestine without perforation or abscess without bleeding: Secondary | ICD-10-CM | POA: Diagnosis not present

## 2021-03-27 DIAGNOSIS — D122 Benign neoplasm of ascending colon: Secondary | ICD-10-CM | POA: Diagnosis not present

## 2021-03-27 DIAGNOSIS — K648 Other hemorrhoids: Secondary | ICD-10-CM | POA: Diagnosis not present

## 2021-03-27 DIAGNOSIS — D124 Benign neoplasm of descending colon: Secondary | ICD-10-CM | POA: Diagnosis not present

## 2021-03-27 DIAGNOSIS — Z86010 Personal history of colonic polyps: Secondary | ICD-10-CM | POA: Diagnosis not present

## 2021-03-27 DIAGNOSIS — K5732 Diverticulitis of large intestine without perforation or abscess without bleeding: Secondary | ICD-10-CM | POA: Diagnosis not present

## 2021-03-27 DIAGNOSIS — D120 Benign neoplasm of cecum: Secondary | ICD-10-CM | POA: Diagnosis not present

## 2021-03-29 DIAGNOSIS — D122 Benign neoplasm of ascending colon: Secondary | ICD-10-CM | POA: Diagnosis not present

## 2021-03-29 DIAGNOSIS — I10 Essential (primary) hypertension: Secondary | ICD-10-CM | POA: Diagnosis not present

## 2021-03-29 DIAGNOSIS — N400 Enlarged prostate without lower urinary tract symptoms: Secondary | ICD-10-CM | POA: Diagnosis not present

## 2021-03-29 DIAGNOSIS — Z0000 Encounter for general adult medical examination without abnormal findings: Secondary | ICD-10-CM | POA: Diagnosis not present

## 2021-03-29 DIAGNOSIS — B009 Herpesviral infection, unspecified: Secondary | ICD-10-CM | POA: Diagnosis not present

## 2021-03-29 DIAGNOSIS — I4891 Unspecified atrial fibrillation: Secondary | ICD-10-CM | POA: Diagnosis not present

## 2021-03-29 DIAGNOSIS — F330 Major depressive disorder, recurrent, mild: Secondary | ICD-10-CM | POA: Diagnosis not present

## 2021-03-29 DIAGNOSIS — R7303 Prediabetes: Secondary | ICD-10-CM | POA: Diagnosis not present

## 2021-03-29 DIAGNOSIS — D120 Benign neoplasm of cecum: Secondary | ICD-10-CM | POA: Diagnosis not present

## 2021-03-29 DIAGNOSIS — E7800 Pure hypercholesterolemia, unspecified: Secondary | ICD-10-CM | POA: Diagnosis not present

## 2021-03-29 DIAGNOSIS — D124 Benign neoplasm of descending colon: Secondary | ICD-10-CM | POA: Diagnosis not present

## 2021-03-29 DIAGNOSIS — R000 Tachycardia, unspecified: Secondary | ICD-10-CM | POA: Diagnosis not present

## 2021-03-30 ENCOUNTER — Other Ambulatory Visit: Payer: Self-pay | Admitting: Family Medicine

## 2021-03-30 DIAGNOSIS — S22010D Wedge compression fracture of first thoracic vertebra, subsequent encounter for fracture with routine healing: Secondary | ICD-10-CM

## 2021-04-03 ENCOUNTER — Encounter: Payer: Self-pay | Admitting: Internal Medicine

## 2021-04-03 ENCOUNTER — Ambulatory Visit: Payer: Medicare PPO | Admitting: Internal Medicine

## 2021-04-03 VITALS — BP 124/82 | HR 102 | Ht 71.0 in | Wt 184.2 lb

## 2021-04-03 DIAGNOSIS — E785 Hyperlipidemia, unspecified: Secondary | ICD-10-CM

## 2021-04-03 DIAGNOSIS — I10 Essential (primary) hypertension: Secondary | ICD-10-CM

## 2021-04-03 DIAGNOSIS — I700 Atherosclerosis of aorta: Secondary | ICD-10-CM

## 2021-04-03 DIAGNOSIS — K8020 Calculus of gallbladder without cholecystitis without obstruction: Secondary | ICD-10-CM | POA: Diagnosis not present

## 2021-04-03 DIAGNOSIS — R0683 Snoring: Secondary | ICD-10-CM | POA: Diagnosis not present

## 2021-04-03 DIAGNOSIS — I4891 Unspecified atrial fibrillation: Secondary | ICD-10-CM | POA: Diagnosis not present

## 2021-04-03 DIAGNOSIS — Z7901 Long term (current) use of anticoagulants: Secondary | ICD-10-CM | POA: Diagnosis not present

## 2021-04-03 DIAGNOSIS — K5732 Diverticulitis of large intestine without perforation or abscess without bleeding: Secondary | ICD-10-CM | POA: Diagnosis not present

## 2021-04-03 MED ORDER — METOPROLOL SUCCINATE ER 25 MG PO TB24: 37.5000 mg | ORAL_TABLET | Freq: Every day | ORAL | 3 refills | Status: DC

## 2021-04-03 NOTE — Progress Notes (Signed)
?Cardiology Office Note:   ? ?Date:  04/03/2021  ? ?ID:  Wesley [name], DOB [birth year], MRN [id number] ? ?PCP:  Glenis Smoker, MD  ? ?[facility] ?Cardiologist:  Lenna Sciara, MD ?Referring MD: Glenis Smoker, *  ? ?Chief Complaint/Reason for Referral: Palpitations ? ?ASSESSMENT:   ? ?1. Atrial fibrillation, unspecified type ([place])   ?2. Hyperlipidemia, unspecified hyperlipidemia type   ?3. Hypertension, unspecified type   ?4. Aortic atherosclerosis ([place])   ?5. Snoring   ? ? ?PLAN:   ? ?In order of problems listed above: ?1.  We will obtain echocardiogram and increase metoprolol to 37.5 mg mg daily, continue apixaban 5 mg twice daily.  Follow-up in 6 months or earlier if needed. ?2.  Continue current therapy; this is being managed by the patient's primary care provider. ?3.  Blood pressures well controlled on his current regimen. ?4.  Continue apixaban and lieu of aspirin, keep blood pressure less than 130 over 80 mg mercury, and goal LDL is less than 70. ?5.  Will obtain sleep study. ? ? ?Dispo:  Return in about 6 months (around 10/03/2021).  ? ?  ? ?Medication Adjustments/Labs and Tests Ordered: ?Current medicines are reviewed at length with the patient today.  Concerns regarding medicines are outlined above. ? ?The following changes have been made:  Increase Toprol ? ?Labs/tests ordered: ?Orders Placed This Encounter  ?Procedures  ? EKG 12-Lead  ? ECHOCARDIOGRAM COMPLETE  ? Split night study  ? ? ?Medication Changes: ?Meds ordered this encounter  ?Medications  ? metoprolol succinate (TOPROL XL) 25 MG 24 hr tablet  ?  Sig: Take 1.5 tablets (37.5 mg total) by mouth daily.  ?  Dispense:  135 tablet  ?  Refill:  3  ? ? ? ?Current medicines are reviewed at length with the patient today.  The patient does not have concerns regarding medicines. ? ? ?History of Present Illness:   ? ?FOCUSED PROBLEM LIST:   ?1.  Hypertension ?2.  Hyperlipidemia ? ? ?The patient is a 76 y.o. male with the  indicated medical history here for palpitations.  Patient was seen by her his primary care provider recently and had informed them that his Apple Watch and detected possible atrial fibrillation at times.  An EKG was done which demonstrated atrial fibrillation the patient was started on Eliquis.  Laboratories were drawn which were unremarkable.  He is referred for further recommendations. ? ?The patient tells me that he has been doing fairly well.  He denies any palpitations but sometimes feels muscle fasciculations when he is laying down.  He denies any exertional angina, exertional dyspnea, severe bleeding or bruising, signs or symptoms of stroke, paroxysmal atrial dyspnea, orthopnea.  He is required no emergency room visits or hospitalizations. ? ?In December after he contracted COVID and was not eating very well he did have a syncopal episode.  EMS was called.  An EKG which I reviewed demonstrated sinus rhythm.  This was chalked up to low blood sugar and poor p.o. intake. ? ?He does enjoy moderate alcohol and does not smoke.  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  He is otherwise well without significant complaints today. ? ?   ?  ?Previous Medical History: ?Past Medical History:  ?Diagnosis Date  ? Depression   ? Diverticulosis   ? Hyperlipidemia   ? Hypertension   ? Migraine   ? ? ? ?Current Medications: ?Current Meds  ?Medication Sig  ? acetaminophen (TYLENOL) 500 MG tablet  Take 2 tablets (1,000 mg total) by mouth every 6 (six) hours as needed for mild pain.  ? amLODipine (NORVASC) 10 MG tablet Take 10 mg by mouth daily.  ? atorvastatin (LIPITOR) 10 MG tablet Take 10 mg by mouth daily.  ? buPROPion (WELLBUTRIN XL) 150 MG 24 hr tablet Take 150 mg by mouth daily.  ? fluticasone (FLONASE) 50 MCG/ACT nasal spray Place 2 sprays into the nose daily as needed for allergies.   ? hydrochlorothiazide (MICROZIDE) 12.5 MG capsule Take 12.5 mg by mouth daily.  ? metoprolol succinate (TOPROL XL) 25 MG 24 hr tablet Take 1.5  tablets (37.5 mg total) by mouth daily.  ? promethazine-dextromethorphan (PROMETHAZINE-DM) 6.25-15 MG/5ML syrup Take 5 mLs by mouth every 6 (six) hours as needed for cough.  ? tamsulosin (FLOMAX) 0.4 MG CAPS capsule Take 0.4 mg by mouth daily after breakfast.  ? triamcinolone cream (KENALOG) 0.1 % Apply 1 application topically 2 (two) times daily as needed (rash, infection).  ? valACYclovir (VALTREX) 1000 MG tablet Take 1,000 mg by mouth every 12 (twelve) hours as needed (for flare up).   ?  ? ?Allergies:    ?Ivp dye [iodinated contrast media], Iohexol, Other, Percocet [oxycodone-acetaminophen], and Zetia [ezetimibe]  ? ?Social History:   ?Social History  ? ?Tobacco Use  ? Smoking status: Never  ? Smokeless tobacco: Former  ?  Types: Chew  ?Substance Use Topics  ? Alcohol use: Yes  ?  Alcohol/week: 1.0 standard drink  ?  Types: 1 Glasses of wine per week  ?  Comment: Daily.  ? Drug use: No  ?  ? ?Family Hx: ?Family History  ?Problem Relation Age of Onset  ? Heart disease Mother   ? Pneumonia Father   ?  ? ?Review of Systems:   ?Please see the history of present illness.    ?All other systems reviewed and are negative. ?  ? ? ?EKGs/Labs/Other Test Reviewed:   ? ?EKG:  EKG is ordered today. ?The ekg ordered today demonstrates atrial fibrillation with a rapid ventricular rate ? ?Prior CV studies: ?None available ? ?Other studies Reviewed: ?Additional studies/ records that were reviewed today include:  ? ?CT abdomen pelvis February 2023 demonstrates aortic atherosclerosis ? ?Cardia monitoring strips which demonstrate atrial fibrillation ? ?Recent Labs: ?External labs from March 2023 demonstrate hemoglobin 16.5, platelets 251, hemoglobin A1c 5.6, sodium 141 potassium 3.6, BUN 12, creatinine 1.19, glucose 91, LFTs within normal limits, TSH within normal limits at 0.43, cholesterol 193, HDL 90, LDL 110, triglycerides 83 ? ?Risk Assessment/Calculations:   ? ? ?CHA2DS2-VASc Score = 3  ? This indicates a 3.2% annual risk of  stroke. ?The patient's score is based upon: ?CHF History: 0 ?HTN History: 1 ?Diabetes History: 0 ?Stroke History: 0 ?Vascular Disease History: 0 ?Age Score: 2 ?Gender Score: 0 ?  ? ?    ? ?Physical Exam:   ? ?VS:  BP 124/82   Pulse (!) 102   Ht '5\' 11"'$  (1.803 m)   Wt 184 lb 4 oz (83.6 kg)   SpO2 97%   BMI 25.70 kg/m?    ?Wt Readings from Last 3 Encounters:  ?04/03/21 184 lb 4 oz (83.6 kg)  ?12/18/20 179 lb 14.3 oz (81.6 kg)  ?04/28/19 181 lb 8 oz (82.3 kg)  ?  ?GENERAL:  No apparent distress, AOx3 ?HEENT:  No carotid bruits, +2 carotid impulses, no scleral icterus ?[id number] RR no murmurs, gallops, rubs, or thrills ?RES:  Clear to auscultation bilaterally ?ABD:  Soft, nontender,  nondistended, positive bowel sounds x 4 ?VASC:  +2 radial pulses, +2 carotid pulses, palpable pedal pulses ?NEURO:  CN 2-12 grossly intact; motor and sensory grossly intact ?PSYCH:  No active depression or anxiety ?EXT:  No edema, ecchymosis, or cyanosis ? ?Signed, ?Early Osmond, MD  ?04/03/2021 8:42 AM    ?[facility] ?[address], [place], [place]  [address] ?Phone: [phone]; Fax: [phone]  ? ?Note:  This document was prepared using Dragon voice recognition software and may include unintentional dictation errors. ?

## 2021-04-03 NOTE — Patient Instructions (Signed)
Medication Instructions:  ?Your physician has recommended you make the following change in your medication:  ? INCREASE the Toprol to 1 1/2 tablet daily = 25 mg 1 1/2 daily  ? ?*If you need a refill on your cardiac medications before your next appointment, please call your pharmacy* ? ? ?Lab Work: ?None ordered ? ?If you have labs (blood work) drawn today and your tests are completely normal, you will receive your results only by: ?MyChart Message (if you have MyChart) OR ?A paper copy in the mail ?If you have any lab test that is abnormal or we need to change your treatment, we will call you to review the results. ? ? ?Testing/Procedures: ?Your physician has requested that you have an echocardiogram. Echocardiography is a painless test that uses sound waves to create images of your heart. It provides your doctor with information about the size and shape of your heart and how well your heart?s chambers and valves are working. This procedure takes approximately one hour. There are no restrictions for this procedure. ? ?Your physician has recommended that you have a sleep study. This test records several body functions during sleep, including: brain activity, eye movement, oxygen and carbon dioxide blood levels, heart rate and rhythm, breathing rate and rhythm, the flow of air through your mouth and nose, snoring, body muscle movements, and chest and belly movement. ? ? ? ?Follow-Up: ?At [facility], you and your health needs are our priority.  As part of our continuing mission to provide you with exceptional heart care, we have created designated Provider Care Teams.  These Care Teams include your primary Cardiologist (physician) and Advanced Practice Providers (APPs -  Physician Assistants and Nurse Practitioners) who all work together to provide you with the care you need, when you need it. ? ?We recommend signing up for the patient portal called "MyChart".  Sign up information is provided on this After Visit  Summary.  MyChart is used to connect with patients for Virtual Visits (Telemedicine).  Patients are able to view lab/test results, encounter notes, upcoming appointments, etc.  Non-urgent messages can be sent to your provider as well.   ?To learn more about what you can do with MyChart, go to [email or web address].   ? ?Your next appointment:   ?6 month(s) ? ?The format for your next appointment:   ?In Person ? ?Provider:   ?Early Osmond, MD   ? ? ?Other Instructions ? ?

## 2021-04-10 DIAGNOSIS — M25561 Pain in right knee: Secondary | ICD-10-CM | POA: Diagnosis not present

## 2021-04-10 DIAGNOSIS — M25562 Pain in left knee: Secondary | ICD-10-CM | POA: Diagnosis not present

## 2021-04-10 DIAGNOSIS — M170 Bilateral primary osteoarthritis of knee: Secondary | ICD-10-CM | POA: Diagnosis not present

## 2021-04-11 ENCOUNTER — Telehealth: Payer: Self-pay | Admitting: *Deleted

## 2021-04-11 DIAGNOSIS — R0683 Snoring: Secondary | ICD-10-CM

## 2021-04-11 DIAGNOSIS — I10 Essential (primary) hypertension: Secondary | ICD-10-CM

## 2021-04-11 DIAGNOSIS — I4891 Unspecified atrial fibrillation: Secondary | ICD-10-CM

## 2021-04-11 NOTE — Telephone Encounter (Signed)
Prior Authorization for split night sent to [facility] via web portal.  Sent to CLINICAL REVIEW  #[id number] ?

## 2021-04-12 NOTE — Telephone Encounter (Signed)
Prior Authorization for SPLIT NIGHT sent to [facility] via web portal.  ?PER JERRI D. DENIED NO QUALIFYING COMORBIDITIES. Can withdraw and resubmit for Home Study NO PA required ? ? ?

## 2021-04-15 DIAGNOSIS — H01005 Unspecified blepharitis left lower eyelid: Secondary | ICD-10-CM | POA: Diagnosis not present

## 2021-04-17 ENCOUNTER — Ambulatory Visit (HOSPITAL_COMMUNITY): Payer: Medicare PPO | Attending: Cardiology

## 2021-04-17 DIAGNOSIS — I10 Essential (primary) hypertension: Secondary | ICD-10-CM | POA: Insufficient documentation

## 2021-04-17 DIAGNOSIS — I700 Atherosclerosis of aorta: Secondary | ICD-10-CM | POA: Diagnosis not present

## 2021-04-17 DIAGNOSIS — E785 Hyperlipidemia, unspecified: Secondary | ICD-10-CM | POA: Insufficient documentation

## 2021-04-17 DIAGNOSIS — H0015 Chalazion left lower eyelid: Secondary | ICD-10-CM | POA: Diagnosis not present

## 2021-04-17 DIAGNOSIS — R0683 Snoring: Secondary | ICD-10-CM | POA: Insufficient documentation

## 2021-04-17 DIAGNOSIS — I4891 Unspecified atrial fibrillation: Secondary | ICD-10-CM | POA: Diagnosis not present

## 2021-04-17 LAB — ECHOCARDIOGRAM COMPLETE
Area-P 1/2: 5.95 cm2
S' Lateral: 3.2 cm

## 2021-04-19 NOTE — Addendum Note (Signed)
Addended by: Freada Bergeron on: 04/19/2021 12:26 PM ? ? Modules accepted: Orders ? ?

## 2021-04-19 NOTE — Telephone Encounter (Addendum)
Received fax from [facility] split night is approved. Prior Authorization for split night sent to [facility] via web portal.   New Valid 06/08/21-07/08/21 Auth # [id number]  Valid dates 05/06/21--06/05/21

## 2021-04-20 DIAGNOSIS — H0015 Chalazion left lower eyelid: Secondary | ICD-10-CM | POA: Diagnosis not present

## 2021-04-20 DIAGNOSIS — N401 Enlarged prostate with lower urinary tract symptoms: Secondary | ICD-10-CM | POA: Diagnosis not present

## 2021-04-20 DIAGNOSIS — C61 Malignant neoplasm of prostate: Secondary | ICD-10-CM | POA: Diagnosis not present

## 2021-04-20 DIAGNOSIS — R3915 Urgency of urination: Secondary | ICD-10-CM | POA: Diagnosis not present

## 2021-04-24 ENCOUNTER — Telehealth: Payer: Self-pay | Admitting: *Deleted

## 2021-04-24 NOTE — Telephone Encounter (Signed)
-----   Message from Wesley Osmond, MD sent at 04/17/2021 11:04 AM EDT ----- ?His echo shows that his heart function is mildly down from a normal of 55-60% EF to 40-45% EF. I think this is due to his AF.  I would like him to continue his Eliquis and would like him to get a cardioversion at the end of May (after 4-5 weeks of Eliquis) to get him back in NSR.  I spoke with him and he agrees with plan.   ?

## 2021-05-09 NOTE — Telephone Encounter (Signed)
Called patient and confirmed that he began Eliquis 5 mg BID on 04/03/21, the day he saw Dr. Ali [name].  Prior to that he was only taking it daily - prescribed by Dr. Lindell Noe on 03/29/21. ? ?I called and arranged cardioversion for 05/24/21 with Dr. Gardiner Rhyme at 9:30 am.  The patient will need labs prior in short stay. ? ?I confirmed the details with the patient and sent instructions through MyChart for him to review.  He voices understanding. ?

## 2021-05-11 NOTE — Addendum Note (Signed)
Addended by: Lenna Sciara on: 05/11/2021 03:03 PM ? ? Modules accepted: Orders ? ?

## 2021-05-11 NOTE — Telephone Encounter (Signed)
Follow up post cardioversion scheduled.  ?

## 2021-05-17 ENCOUNTER — Encounter (HOSPITAL_COMMUNITY): Payer: Self-pay | Admitting: Cardiology

## 2021-05-22 ENCOUNTER — Encounter: Payer: Self-pay | Admitting: Internal Medicine

## 2021-05-22 MED ORDER — APIXABAN 5 MG PO TABS: 5.0000 mg | ORAL_TABLET | Freq: Two times a day (BID) | ORAL | 2 refills | Status: DC

## 2021-05-22 NOTE — Telephone Encounter (Signed)
Refilled patient's Eliquis.

## 2021-05-24 ENCOUNTER — Encounter (HOSPITAL_COMMUNITY): Payer: Self-pay | Admitting: Cardiology

## 2021-05-24 ENCOUNTER — Ambulatory Visit (HOSPITAL_COMMUNITY): Payer: Medicare PPO | Admitting: General Practice

## 2021-05-24 ENCOUNTER — Ambulatory Visit (HOSPITAL_BASED_OUTPATIENT_CLINIC_OR_DEPARTMENT_OTHER): Payer: Medicare PPO | Admitting: General Practice

## 2021-05-24 ENCOUNTER — Ambulatory Visit (HOSPITAL_COMMUNITY)
Admission: RE | Admit: 2021-05-24 | Discharge: 2021-05-24 | Disposition: A | Discharge status: Home / Self Care | Payer: Medicare PPO | Attending: Cardiology | Admitting: Cardiology

## 2021-05-24 ENCOUNTER — Encounter (HOSPITAL_COMMUNITY)
Admission: RE | Disposition: A | Discharge status: Home / Self Care | Payer: Self-pay | Source: Home / Self Care | Attending: Cardiology

## 2021-05-24 ENCOUNTER — Other Ambulatory Visit: Payer: Self-pay

## 2021-05-24 DIAGNOSIS — I10 Essential (primary) hypertension: Secondary | ICD-10-CM

## 2021-05-24 DIAGNOSIS — Z7901 Long term (current) use of anticoagulants: Secondary | ICD-10-CM | POA: Diagnosis not present

## 2021-05-24 DIAGNOSIS — I4819 Other persistent atrial fibrillation: Secondary | ICD-10-CM | POA: Diagnosis not present

## 2021-05-24 DIAGNOSIS — I110 Hypertensive heart disease with heart failure: Secondary | ICD-10-CM | POA: Diagnosis not present

## 2021-05-24 DIAGNOSIS — I5022 Chronic systolic (congestive) heart failure: Secondary | ICD-10-CM | POA: Insufficient documentation

## 2021-05-24 DIAGNOSIS — I4891 Unspecified atrial fibrillation: Secondary | ICD-10-CM

## 2021-05-24 HISTORY — PX: CARDIOVERSION: SHX1299

## 2021-05-24 LAB — POCT I-STAT, CHEM 8
BUN: 19 mg/dL (ref 8–23)
Calcium, Ion: 1.14 mmol/L — ABNORMAL LOW (ref 1.15–1.40)
Chloride: 102 mmol/L (ref 98–111)
Creatinine, Ser: 1.2 mg/dL (ref 0.61–1.24)
Glucose, Bld: 97 mg/dL (ref 70–99)
HCT: 51 % (ref 39.0–52.0)
Hemoglobin: 17.3 g/dL — ABNORMAL HIGH (ref 13.0–17.0)
Potassium: 4 mmol/L (ref 3.5–5.1)
Sodium: 138 mmol/L (ref 135–145)
TCO2: 26 mmol/L (ref 22–32)

## 2021-05-24 SURGERY — CARDIOVERSION
Anesthesia: General

## 2021-05-24 MED ORDER — SODIUM CHLORIDE 0.9 % IV SOLN: INTRAVENOUS | Status: DC

## 2021-05-24 MED ORDER — PROPOFOL 10 MG/ML IV BOLUS
INTRAVENOUS | Status: DC | PRN
  Administered 2021-05-24: 80 mg via INTRAVENOUS
  Administered 2021-05-24: 20 mg via INTRAVENOUS

## 2021-05-24 MED ORDER — LIDOCAINE 2% (20 MG/ML) 5 ML SYRINGE
INTRAMUSCULAR | Status: DC | PRN
  Administered 2021-05-24: 40 mg via INTRAVENOUS

## 2021-05-24 NOTE — Anesthesia Postprocedure Evaluation (Signed)
Anesthesia Post Note  Patient: ANTWAN PANDYA  Procedure(s) Performed: CARDIOVERSION     Patient location during evaluation: Endoscopy Anesthesia Type: General Level of consciousness: awake and alert, patient cooperative and oriented Pain management: pain level controlled Vital Signs Assessment: post-procedure vital signs reviewed and stable Respiratory status: spontaneous breathing, nonlabored ventilation and respiratory function stable Cardiovascular status: blood pressure returned to baseline and stable Postop Assessment: no apparent nausea or vomiting, able to ambulate and adequate PO intake Anesthetic complications: no   No notable events documented.  Last Vitals:  Vitals:   05/24/21 1002 05/24/21 1012  BP: 127/74 118/80  Pulse: 72 69  Resp: 14 10  Temp:    SpO2: 94% 96%    Last Pain:  Vitals:   05/24/21 1012  TempSrc:   PainSc: 0-No pain                 Nicko Daher,E. Indianna Boran

## 2021-05-24 NOTE — Anesthesia Preprocedure Evaluation (Addendum)
Anesthesia Evaluation  Patient identified by MRN, date of birth, ID band Patient awake    Reviewed: Allergy & Precautions, NPO status , Patient's Chart, lab work & pertinent test results, reviewed documented beta blocker date and time   History of Anesthesia Complications Negative for: history of anesthetic complications  Airway Mallampati: I  TM Distance: >3 FB Neck ROM: Full    Dental  (+) Dental Advisory Given, Teeth Intact   Pulmonary neg pulmonary ROS,    breath sounds clear to auscultation       Cardiovascular hypertension, Pt. on medications and Pt. on home beta blockers (-) angina+ dysrhythmias Atrial Fibrillation  Rhythm:Irregular Rate:Normal  04/2021 ECHO: EF 40-45%, mildly decreased LVF, mild LVH, mildly reduced RVF, trivial MR   Neuro/Psych  Headaches, Depression    GI/Hepatic Neg liver ROS, GERD  Medicated and Controlled,  Endo/Other  negative endocrine ROS  Renal/GU negative Renal ROS     Musculoskeletal   Abdominal   Peds  Hematology eliquis   Anesthesia Other Findings   Reproductive/Obstetrics                            Anesthesia Physical Anesthesia Plan  ASA: 3  Anesthesia Plan: General   Post-op Pain Management: Minimal or no pain anticipated   Induction:   PONV Risk Score and Plan: 2 and Treatment may vary due to age or medical condition  Airway Management Planned: Natural Airway and Mask  Additional Equipment: None  Intra-op Plan:   Post-operative Plan:   Informed Consent: I have reviewed the patients History and Physical, chart, labs and discussed the procedure including the risks, benefits and alternatives for the proposed anesthesia with the patient or authorized representative who has indicated his/her understanding and acceptance.     Dental advisory given  Plan Discussed with: CRNA and Surgeon  Anesthesia Plan Comments:        Anesthesia  Quick Evaluation

## 2021-05-24 NOTE — Transfer of Care (Signed)
Immediate Anesthesia Transfer of Care Note  Patient: Wesley [name]  Procedure(s) Performed: CARDIOVERSION  Patient Location: Endoscopy Unit  Anesthesia Type:General  Level of Consciousness: awake and drowsy  Airway & Oxygen Therapy: Patient Spontanous Breathing  Post-op Assessment: Report given to RN and Post -op Vital signs reviewed and stable  Post vital signs: Reviewed and stable  Last Vitals:  Vitals Value Taken Time  BP 133/79 (94)   Temp    Pulse 71   Resp 14   SpO2 96     Last Pain:  Vitals:   05/24/21 0800  TempSrc: Temporal  PainSc: 0-No pain         Complications: No notable events documented.

## 2021-05-24 NOTE — CV Procedure (Signed)
Procedure:   DCCV  Indication:  Symptomatic atrial fibrillation  Procedure Note:  The patient signed informed consent.  They have had had therapeutic anticoagulation with Eliquis greater than 3 weeks.  Anesthesia was administered by Dr. Glennon Mac and Laban Emperor, CRNA.  Adequate airway was maintained throughout and vital followed per protocol.  They were cardioverted x 1 with 200J of biphasic synchronized energy.  They converted to NSR with rate 70s.  There were no apparent complications.  The patient had normal neuro status and respiratory status post procedure with vitals stable as recorded elsewhere.    Follow up:  They will continue on current medical therapy and follow up with cardiology as scheduled.  Oswaldo Milian, MD 05/24/2021 9:53 AM

## 2021-05-24 NOTE — H&P (Signed)
Mr Wesley [name] is a 25M with new diagnosis Afib and HFrEF (40-45%).  Presents today for cardioversion.  He reports feeling fatigued but denies any chest pain or dyspnea.  GEN:  in no acute distress HEENT: normal Neck: no JVD Cardiac: irregular, tachycardic Respiratory:  clear to auscultation bilaterally, normal work of breathing GI: soft, nontender MS: no deformity  Skin: warm and dry, no rash Neuro:  Alert and Oriented x 3 Psych: normal affect  A/P: Plan for cardioversion today for new Afib and HFrEF, suspected to be tachycardiac induced.  He reports compliance with Eliquis, no missed doses over last 3 weeks.  Donato Heinz, MD

## 2021-05-24 NOTE — Anesthesia Procedure Notes (Signed)
Procedure Name: General with mask airway Date/Time: 05/24/2021 9:41 AM Performed by: Dorann Lodge, CRNA Pre-anesthesia Checklist: Patient identified, Emergency Drugs available, Suction available, Patient being monitored and Timeout performed Patient Re-evaluated:Patient Re-evaluated prior to induction Oxygen Delivery Method: Ambu bag Preoxygenation: Pre-oxygenation with 100% oxygen Induction Type: IV induction

## 2021-05-24 NOTE — Discharge Instructions (Signed)

## 2021-05-25 ENCOUNTER — Encounter (HOSPITAL_COMMUNITY): Payer: Self-pay | Admitting: Cardiology

## 2021-06-02 DIAGNOSIS — H524 Presbyopia: Secondary | ICD-10-CM | POA: Diagnosis not present

## 2021-06-02 DIAGNOSIS — H0015 Chalazion left lower eyelid: Secondary | ICD-10-CM | POA: Diagnosis not present

## 2021-06-02 DIAGNOSIS — H2513 Age-related nuclear cataract, bilateral: Secondary | ICD-10-CM | POA: Diagnosis not present

## 2021-06-02 DIAGNOSIS — H5203 Hypermetropia, bilateral: Secondary | ICD-10-CM | POA: Diagnosis not present

## 2021-06-05 ENCOUNTER — Ambulatory Visit (HOSPITAL_BASED_OUTPATIENT_CLINIC_OR_DEPARTMENT_OTHER): Payer: Medicare PPO | Admitting: Cardiology

## 2021-06-07 DIAGNOSIS — M170 Bilateral primary osteoarthritis of knee: Secondary | ICD-10-CM | POA: Diagnosis not present

## 2021-06-08 ENCOUNTER — Ambulatory Visit (HOSPITAL_BASED_OUTPATIENT_CLINIC_OR_DEPARTMENT_OTHER): Payer: Medicare PPO | Attending: Internal Medicine | Admitting: Cardiology

## 2021-06-08 DIAGNOSIS — R0683 Snoring: Secondary | ICD-10-CM | POA: Insufficient documentation

## 2021-06-08 DIAGNOSIS — I10 Essential (primary) hypertension: Secondary | ICD-10-CM | POA: Diagnosis not present

## 2021-06-08 DIAGNOSIS — I4891 Unspecified atrial fibrillation: Secondary | ICD-10-CM | POA: Insufficient documentation

## 2021-06-08 DIAGNOSIS — G4733 Obstructive sleep apnea (adult) (pediatric): Secondary | ICD-10-CM | POA: Diagnosis not present

## 2021-06-11 NOTE — Procedures (Signed)
   Patient Name: Wesley [name], Wesley [name] Date:06/08/2021 Gender: Male D.O.B: 1945-08-31 Age (years): 75 Referring Provider: Lenna Sciara Height (inches): 14 Interpreting Physician: Fransico Him MD, ABSM Weight (lbs): 185 RPSGT: Baxter Flattery BMI: 26 MRN: [id number] Neck Size: 17.50  CLINICAL INFORMATION Sleep Study Type: NPSG  Indication for sleep study: Snoring, Witnesses Apnea / Gasping During Sleep  Epworth Sleepiness Score: 10  SLEEP STUDY TECHNIQUE As per the AASM Manual for the Scoring of Sleep and Associated Events v2.3 (April 2016) with a hypopnea requiring 4% desaturations.  The channels recorded and monitored were frontal, central and occipital EEG, electrooculogram (EOG), submentalis EMG (chin), nasal and oral airflow, thoracic and abdominal wall motion, anterior tibialis EMG, snore microphone, electrocardiogram, and pulse oximetry.  MEDICATIONS Medications self-administered by patient taken the night of the study : N/A  SLEEP ARCHITECTURE The study was initiated at 9:51:33 PM and ended at 4:44:38 AM.  Sleep onset time was 18.3 minutes and the sleep efficiency was 50.6%%. The total sleep time was 209 minutes.  Stage REM latency was N/A minutes.  The patient spent 8.4%% of the night in stage N1 sleep, 91.6%% in stage N2 sleep, 0.0%% in stage N3 and 0% in REM.  Alpha intrusion was absent.  Supine sleep was 18.42%.  RESPIRATORY PARAMETERS The overall apnea/hypopnea index (AHI) was 8.0 per hour. There were 15 total apneas, including 4 obstructive, 11 central and 0 mixed apneas. There were 13 hypopneas and 5 RERAs.  The AHI during Stage REM sleep was N/A per hour.  AHI while supine was 12.5 per hour.  The mean oxygen saturation was 88.1%. The minimum SpO2 during sleep was 85.0%.  loud snoring was noted during this study.  CARDIAC DATA The 2 lead EKG demonstrated sinus rhythm. The mean heart rate was 53.5 beats per minute. Other EKG findings  include:None  LEG MOVEMENT DATA The total PLMS were 0 with a resulting PLMS index of 0.0. Associated arousal with leg movement index was 0.0 .  IMPRESSIONS - Mild obstructive sleep apnea occurred during this study (AHI = 8.0/h). - No significant central sleep apnea occurred during this study (CAI = 3.2/h). - Mild oxygen desaturation was noted during this study (Min O2 = 85.0%). - The patient snored with loud snoring volume. - EKG findings NSR. - Clinically significant periodic limb movements did not occur during sleep. No significant associated arousals.  DIAGNOSIS - Obstructive Sleep Apnea (G47.33) - Nocturnal Hypoxemia (G47.36)  RECOMMENDATIONS - Therapeutic CPAP titration to determine optimal pressure required to alleviate sleep disordered breathing. - Avoid alcohol, sedatives and other CNS depressants that may worsen sleep apnea and disrupt normal sleep architecture. - Sleep hygiene should be reviewed to assess factors that may improve sleep quality. - Weight management and regular exercise should be initiated or continued if appropriate.  [Electronically signed] 06/11/2021 06:28 PM  Fransico Him MD, ABSM Diplomate, American Board of Sleep Medicine

## 2021-06-20 ENCOUNTER — Telehealth: Payer: Self-pay | Admitting: *Deleted

## 2021-06-20 DIAGNOSIS — G4733 Obstructive sleep apnea (adult) (pediatric): Secondary | ICD-10-CM

## 2021-06-20 NOTE — Telephone Encounter (Signed)
-----   Message from Lauralee Evener, [place] sent at 06/12/2021 10:04 AM EDT -----  ----- Message ----- From: Sueanne Margarita, MD Sent: 06/11/2021   6:29 PM EDT To: Cv Div Sleep Studies  Please let patient know that they have sleep apnea.  Recommend therapeutic CPAP titration for treatment of patient's sleep disordered breathing.  If unable to perform an in lab titration then initiate ResMed auto CPAP from 4 to 15cm H2O with heated humidity and mask of choice and overnight pulse ox on CPAP.

## 2021-06-20 NOTE — Telephone Encounter (Signed)
The patient has been notified of the result and verbalized understanding.  All questions (if any) were answered. Marolyn Hammock, [place] 06/20/2021 4:27 PM    Will titrate the cpap titration

## 2021-06-20 NOTE — Progress Notes (Signed)
Cardiology Office Note:    Date:  06/23/2021   ID:  Wesley [name], Wesley [name] 21-Mar-1945, MRN [id number]  PCP:  Shon Hale, MD   [facility] Providers Cardiologist:  Alverda Skeans, MD Referring MD: Shon Hale, *   Chief Complaint/Reason for Referral: Palpitations  ASSESSMENT:    1. Atrial fibrillation, unspecified type (HCC)   2. Hyperlipidemia, unspecified hyperlipidemia type   3. Hypertension, unspecified type   4. Aortic atherosclerosis (HCC)   5. OSA (obstructive sleep apnea)      PLAN:    In order of problems listed above: 1.  Atrial fibrillation: Continue Eliquis and Toprol.  He feels much better in sinus rhythm.  Follow-up in 1 year. 2.  Hyperlipidemia: Continue current therapy; this is being managed by the patient's primary care provider.  Patient's goal LDL is less than 70 given aortic atherosclerosis 3.  Hypertension: Blood pressures well controlled on his current regimen. 4.  Aortic atherosclerosis: Continue apixaban and lieu of aspirin, keep blood pressure less than 130 over 80 mg mercury, and goal LDL is less than 70. 5.  Obstructive sleep apnea: The patient was diagnosed with obstructive sleep apnea and is being managed by the sleep medicine division here.   Dispo:  Return in about 1 year (around 06/24/2022).      Medication Adjustments/Labs and Tests Ordered: Current medicines are reviewed at length with the patient today.  Concerns regarding medicines are outlined above.  The following changes have been made:  Increase Toprol  Labs/tests ordered: Orders Placed This Encounter  Procedures   EKG 12-Lead    Medication Changes: No orders of the defined types were placed in this encounter.    Current medicines are reviewed at length with the patient today.  The patient does not have concerns regarding medicines.   History of Present Illness:    FOCUSED PROBLEM LIST:   1.  Hypertension 2.  Hyperlipidemia 3.  Paroxysmal  atrial fibrillation CV2 score of 3 on Eliquis status post cardioversion May 2023 4.  Obstructive sleep apnea   April 2023:  The patient was seen for initial consultation regarding atrial fibrillation.  Toprol was increased to 37.5 mg and his apixaban was continued.  He was referred for sleep study which did demonstrate sleep apnea.  He was referred for cardioversion which successfully converted patient to normal sinus rhythm.  Today: The patient is doing very well.  He feels much better now that he is in sinus rhythm.  He does occasionally get orthopneic when he goes from sitting to standing quickly.  He denies any palpitations.  He was diagnosed with sleep apnea and is in the process of getting fitted with a mask.  He has not required emergency room visits or hospitalizations.  He has had no severe bleeding or bruising episodes while on Eliquis.       Previous Medical History: Past Medical History:  Diagnosis Date   Depression    Diverticulosis    Hyperlipidemia    Hypertension    Migraine      Current Medications: Current Meds  Medication Sig   amLODipine (NORVASC) 10 MG tablet Take 10 mg by mouth daily.   apixaban (ELIQUIS) 5 MG TABS tablet Take 1 tablet (5 mg total) by mouth 2 (two) times daily.   atorvastatin (LIPITOR) 10 MG tablet Take 10 mg by mouth daily.   buPROPion (WELLBUTRIN XL) 150 MG 24 hr tablet Take 150 mg by mouth daily.   hydrochlorothiazide (MICROZIDE) 12.5 MG capsule  Take 12.5 mg by mouth daily.   metoprolol succinate (TOPROL XL) 25 MG 24 hr tablet Take 1.5 tablets (37.5 mg total) by mouth daily.   tamsulosin (FLOMAX) 0.4 MG CAPS capsule Take 0.4 mg by mouth daily after breakfast.   valACYclovir (VALTREX) 1000 MG tablet Take 1,000 mg by mouth every 12 (twelve) hours as needed (for flare up).      Allergies:    Ivp dye [iodinated contrast media], Ambien [zolpidem], Iohexol, Penicillins, Percocet [oxycodone-acetaminophen], and Zetia [ezetimibe]   Social History:    Social History   Tobacco Use   Smoking status: Never   Smokeless tobacco: Former    Types: Chew  Substance Use Topics   Alcohol use: Yes    Alcohol/week: 1.0 standard drink of alcohol    Types: 1 Glasses of wine per week    Comment: Daily.   Drug use: No     Family Hx: Family History  Problem Relation Age of Onset   Heart disease Mother    Pneumonia Father      Review of Systems:   Please see the history of present illness.    All other systems reviewed and are negative.     EKGs/Labs/Other Test Reviewed:    EKG:  EKG that I reviewed demonstrates sinus bradycardia   Prior CV studies: None available  Other studies Reviewed: Additional studies/ records that were reviewed today include:   CT abdomen pelvis February 2023 demonstrates aortic atherosclerosis  Cardia monitoring strips which demonstrate atrial fibrillation  Recent Labs: External labs from March 2023 demonstrate hemoglobin 16.5, platelets 251, hemoglobin A1c 5.6, sodium 141 potassium 3.6, BUN 12, creatinine 1.19, glucose 91, LFTs within normal limits, TSH within normal limits at 0.43, cholesterol 193, HDL 90, LDL 110, triglycerides 83  Risk Assessment/Calculations:     CHA2DS2-VASc Score = 3   This indicates a 3.2% annual risk of stroke. The patient's score is based upon: CHF History: 0 HTN History: 1 Diabetes History: 0 Stroke History: 0 Vascular Disease History: 0 Age Score: 2 Gender Score: 0         Physical Exam:    VS:  BP 100/60 (BP Location: Left Arm, Patient Position: Sitting, Cuff Size: Normal)   Pulse (!) 53   Ht 5\' 11"  (1.803 m)   Wt 183 lb (83 kg)   BMI 25.52 kg/m    Wt Readings from Last 3 Encounters:  06/23/21 183 lb (83 kg)  06/08/21 185 lb (83.9 kg)  05/24/21 184 lb 4.9 oz (83.6 kg)    GENERAL:  No apparent distress, AOx3 HEENT:  No carotid bruits, +2 carotid impulses, no scleral icterus CAR: RRR no murmurs, gallops, rubs, or thrills RES:  Clear to auscultation  bilaterally ABD:  Soft, nontender, nondistended, positive bowel sounds x 4 VASC:  +2 radial pulses, +2 carotid pulses, palpable pedal pulses NEURO:  CN 2-12 grossly intact; motor and sensory grossly intact PSYCH:  No active depression or anxiety EXT:  No edema, ecchymosis, or cyanosis  Signed, Orbie Pyo, MD  06/23/2021 11:43 AM    [facility] [address] [place], [place], [place]  [address] Phone: [phone]; Fax: [phone]   Note:  This document was prepared using Dragon voice recognition software and may include unintentional dictation errors.

## 2021-06-21 DIAGNOSIS — M170 Bilateral primary osteoarthritis of knee: Secondary | ICD-10-CM | POA: Diagnosis not present

## 2021-06-22 NOTE — Addendum Note (Signed)
Addended by: Freada Bergeron on: 06/22/2021 05:50 PM   Modules accepted: Orders

## 2021-06-23 ENCOUNTER — Encounter: Payer: Self-pay | Admitting: Internal Medicine

## 2021-06-23 ENCOUNTER — Ambulatory Visit: Payer: Medicare PPO | Admitting: Internal Medicine

## 2021-06-23 DIAGNOSIS — I10 Essential (primary) hypertension: Secondary | ICD-10-CM | POA: Diagnosis not present

## 2021-06-23 DIAGNOSIS — I700 Atherosclerosis of aorta: Secondary | ICD-10-CM

## 2021-06-23 DIAGNOSIS — G4733 Obstructive sleep apnea (adult) (pediatric): Secondary | ICD-10-CM | POA: Diagnosis not present

## 2021-06-23 DIAGNOSIS — I4891 Unspecified atrial fibrillation: Secondary | ICD-10-CM

## 2021-06-23 DIAGNOSIS — E785 Hyperlipidemia, unspecified: Secondary | ICD-10-CM

## 2021-07-03 NOTE — Telephone Encounter (Signed)
Prior Authorization for TITRATION sent to [facility] via web portal. Tracking Number. Hilliard Clark NUMBER [id number]

## 2021-08-09 ENCOUNTER — Ambulatory Visit (HOSPITAL_BASED_OUTPATIENT_CLINIC_OR_DEPARTMENT_OTHER): Payer: Medicare PPO | Attending: Cardiology | Admitting: Cardiology

## 2021-08-09 VITALS — Ht 71.0 in | Wt 183.0 lb

## 2021-08-09 DIAGNOSIS — G4733 Obstructive sleep apnea (adult) (pediatric): Secondary | ICD-10-CM | POA: Diagnosis not present

## 2021-08-10 NOTE — Procedures (Signed)
   Patient Name: Wesley [name], Wesley [name] Date: 08/09/2021 Gender: Male D.O.B: 09-28-1945 Age (years): 89 Referring Provider: Fransico Him MD, ABSM Height (inches): 71 Interpreting Physician: Fransico Him MD, ABSM Weight (lbs): 183 RPSGT: Zadie Rhine BMI: 26 MRN: [id number] Neck Size: 18.00  CLINICAL INFORMATION The patient is referred for a CPAP titration to treat sleep apnea.  SLEEP STUDY TECHNIQUE As per the AASM Manual for the Scoring of Sleep and Associated Events v2.3 (April 2016) with a hypopnea requiring 4% desaturations.  The channels recorded and monitored were frontal, central and occipital EEG, electrooculogram (EOG), submentalis EMG (chin), nasal and oral airflow, thoracic and abdominal wall motion, anterior tibialis EMG, snore microphone, electrocardiogram, and pulse oximetry. Continuous positive airway pressure (CPAP) was initiated at the beginning of the study and titrated to treat sleep-disordered breathing.  MEDICATIONS Medications self-administered by patient taken the night of the study : ELIQUIS  TECHNICIAN COMMENTS Comments added by technician: No restroom visted. Patient had difficulty initiating sleep. Comments added by scorer: N/A  RESPIRATORY PARAMETERS Optimal PAP Pressure (cm): 10  AHI at Optimal Pressure (/hr):0.4 Overall Minimal O2 (%):86.0  Supine % at Optimal Pressure (%):79 Minimal O2 at Optimal Pressure (%): 86.0   SLEEP ARCHITECTURE The study was initiated at 9:44:07 PM and ended at 3:39:55 AM.  Sleep onset time was 28.7 minutes and the sleep efficiency was 57.9%. The total sleep time was 206 minutes.  The patient spent 5.6% of the night in stage N1 sleep, 86.9% in stage N2 sleep, 0.0% in stage N3 and 7.5% in REM.Stage REM latency was 224.0 minutes  Wake after sleep onset was 121.1. Alpha intrusion was absent. Supine sleep was 57.52%.  CARDIAC DATA The 2 lead EKG demonstrated sinus rhythm. The mean heart rate was 50.1 beats per minute.  Other EKG findings include: None.  LEG MOVEMENT DATA The total Periodic Limb Movements of Sleep (PLMS) were 0. The PLMS index was 0.0. A PLMS index of <15 is considered normal in adults.  IMPRESSIONS - The optimal PAP pressure was 10 cm of water. - Central sleep apnea was not noted during this titration (CAI = 4.1/h). - Moderate oxygen desaturations were observed during this titration (min O2 = 86.0%). - No snoring was audible during this study. - No cardiac abnormalities were observed during this study. - Clinically significant periodic limb movements were not noted during this study. Arousals associated with PLMs were significant.  DIAGNOSIS - Obstructive Sleep Apnea (G47.33)  RECOMMENDATIONS - Trial of CPAP therapy on 10 cm H2O with a Medium size Resmed Full Face AirFit F20 mask and heated humidification. - Avoid alcohol, sedatives and other CNS depressants that may worsen sleep apnea and disrupt normal sleep architecture. - Sleep hygiene should be reviewed to assess factors that may improve sleep quality. - Weight management and regular exercise should be initiated or continued. - Return to [facility] Center for re-evaluation after 6 weeks of therapy  [Electronically signed] 08/10/2021 04:10 PM  Fransico Him MD, ABSM Diplomate, American Board of Sleep Medicine

## 2021-08-16 ENCOUNTER — Telehealth: Payer: Self-pay | Admitting: *Deleted

## 2021-08-16 ENCOUNTER — Encounter: Payer: Self-pay | Admitting: Internal Medicine

## 2021-08-16 NOTE — Telephone Encounter (Signed)
The patient has been notified of the result and verbalized understanding.  All questions (if any) were answered. Wesley [name], [place] 08/16/2021 5:58 PM    Upon patient request DME selection is [facility] [name]. Patient understands he will be contacted by [facility] to set up his cpap. Patient understands to call if [facility] does not contact him with new setup in a timely manner. Patient understands they will be called once confirmation has been received from [employer]/ that they have received their new machine to schedule 10 week follow up appointment.   [facility] notified of new cpap order  Please add to airview Patient was grateful for the call and thanked me.

## 2021-08-16 NOTE — Telephone Encounter (Signed)
-----   Message from Cleon Gustin, [place] sent at 08/12/2021  4:03 PM EDT -----  ----- Message ----- From: Sueanne Margarita, MD Sent: 08/10/2021   4:12 PM EDT To: Cv Div Sleep Studies  Please let patient know that they had a successful PAP titration and let DME know that orders are in EPIC.  Please set up 6 week OV with me. Please order ONO on CPAP

## 2021-09-25 ENCOUNTER — Encounter: Payer: Self-pay | Admitting: Internal Medicine

## 2021-09-27 DIAGNOSIS — H0012 Chalazion right lower eyelid: Secondary | ICD-10-CM | POA: Diagnosis not present

## 2021-09-29 DIAGNOSIS — I4891 Unspecified atrial fibrillation: Secondary | ICD-10-CM | POA: Diagnosis not present

## 2021-09-29 DIAGNOSIS — F330 Major depressive disorder, recurrent, mild: Secondary | ICD-10-CM | POA: Diagnosis not present

## 2021-09-29 DIAGNOSIS — I10 Essential (primary) hypertension: Secondary | ICD-10-CM | POA: Diagnosis not present

## 2021-09-29 DIAGNOSIS — D6869 Other thrombophilia: Secondary | ICD-10-CM | POA: Diagnosis not present

## 2021-10-02 DIAGNOSIS — C61 Malignant neoplasm of prostate: Secondary | ICD-10-CM | POA: Diagnosis not present

## 2021-10-04 ENCOUNTER — Other Ambulatory Visit: Payer: Medicare PPO

## 2021-10-09 DIAGNOSIS — N401 Enlarged prostate with lower urinary tract symptoms: Secondary | ICD-10-CM | POA: Diagnosis not present

## 2021-10-09 DIAGNOSIS — R351 Nocturia: Secondary | ICD-10-CM | POA: Diagnosis not present

## 2021-10-09 DIAGNOSIS — C61 Malignant neoplasm of prostate: Secondary | ICD-10-CM | POA: Diagnosis not present

## 2021-10-11 ENCOUNTER — Ambulatory Visit: Payer: Medicare PPO | Admitting: Internal Medicine

## 2022-01-01 NOTE — Progress Notes (Unsigned)
SLEEP MEDICINE VIRTUAL CONSULT NOTE via Video Note   Because of Wesley [name]'s co-morbid illnesses, he is at least at moderate risk for complications without adequate follow up.  This format is felt to be most appropriate for this patient at this time.  All issues noted in this document were discussed and addressed.  A limited physical exam was performed with this format.  Please refer to the patient's chart for his consent to telehealth for [facility].      Date:  01/03/2022   ID:  Wesley [name], Wesley [name] 11/09/1945, MRN [id number] The patient was identified using 2 identifiers.  Patient Location: Home Provider Location: Home Office   PCP:  Shon Hale, MD   [facility] Providers Cardiologist:  Orbie Pyo, MD     Evaluation Performed:  New Patient Evaluation  Chief Complaint:  OSA  History of Present Illness:    Wesley [name] is a 77 y.o. male who is being seen today for the evaluation of OSA at the request of  Alverda Skeans, MD.  Wesley [name] is a 77 y.o. male with history of asthma atrial fibrillation, depression, hyperlipidemia and hypertension.  Due to a history of atrial fibrillation as well as complaints of snoring a home sleep study was ordered.  This showed mild obstructive sleep apnea with an AHI of 8 and O2 saturations as low as 85%.  Went CPAP titration to 10 cm H2O and was started on auto CPAP.  He is now referred for sleep medicine consultation to establish sleep care.  He is doing well with his PAP device and thinks that he has gotten used to it.  He tolerates the mask and feels the pressure is adequate.  Since going on PAP he feels rested in the am and has no significant daytime sleepiness.  He denies any significant mouth or nasal dryness or nasal congestion.  He does not think that he snores.     Past Medical History:  Diagnosis Date   Depression    Diverticulosis    Hyperlipidemia    Hypertension     Migraine    OSA on CPAP    mild obstructive sleep apnea with an AHI of 8 and O2 saturations as low as 85%.  On CPAP 10cm H2O   Past Surgical History:  Procedure Laterality Date   CARDIOVERSION N/A 05/24/2021   Procedure: CARDIOVERSION;  Surgeon: Little Ishikawa, MD;  Location: [facility];  Service: Cardiovascular;  Laterality: N/A;   CYSTOSCOPY WITH INSERTION OF UROLIFT N/A 04/28/2019   Procedure: CYSTOSCOPY WITH INSERTION OF UROLIFT;  Surgeon: Jerilee Field, MD;  [facility];  Service: Urology;  Laterality: N/A;   GANGLION CYST EXCISION     PROSTATE BIOPSY N/A 04/28/2019   Procedure: BIOPSY TRANSRECTAL ULTRASONIC PROSTATE (TUBP);  Surgeon: Jerilee Field, MD;  Location: [facility];  Service: Urology;  Laterality: N/A;     Current Meds  Medication Sig   amLODipine (NORVASC) 10 MG tablet Take 10 mg by mouth [name].   apixaban (ELIQUIS) 5 MG TABS tablet Take 1 tablet (5 mg total) by mouth 2 (two) times [name].   atorvastatin (LIPITOR) 10 MG tablet Take 10 mg by mouth [name].   buPROPion (WELLBUTRIN XL) 150 MG 24 hr tablet Take 150 mg by mouth [name].   hydrochlorothiazide (MICROZIDE) 12.5 MG capsule Take 12.5 mg by mouth [name].   metoprolol succinate (TOPROL XL) 25 MG 24 hr  tablet Take 1.5 tablets (37.5 mg total) by mouth [name].   tamsulosin (FLOMAX) 0.4 MG CAPS capsule Take 0.4 mg by mouth [name] after breakfast.   valACYclovir (VALTREX) 1000 MG tablet Take 1,000 mg by mouth every 12 (twelve) hours as needed (for flare up).      Allergies:   Ivp dye [iodinated contrast media], Ambien [zolpidem], Iohexol, Penicillins, Percocet [oxycodone-acetaminophen], and Zetia [ezetimibe]   Social History   Tobacco Use   Smoking status: Never   Smokeless tobacco: Former    Types: Chew  Substance Use Topics   Alcohol use: Yes    Alcohol/week: 1.0 standard drink of alcohol    Types: 1 Glasses of wine per week    Comment: [name].   Drug use: No      Family Hx: The patient's family history includes Heart disease in his mother; Pneumonia in his father.  ROS:   Please see the history of present illness.     All other systems reviewed and are negative.   Prior Sleep studies:   The following studies were reviewed today:  PSG, CPAP titration, PAP compliance download  Labs/Other Tests and Data Reviewed:     Recent Labs: 05/24/2021: BUN 19; Creatinine, Ser 1.20; Hemoglobin 17.3; Potassium 4.0; Sodium 138    Wt Readings from Last 3 Encounters:  01/03/22 186 lb (84.4 kg)  08/09/21 183 lb (83 kg)  06/23/21 183 lb (83 kg)     Risk Assessment/Calculations:    CHA2DS2-VASc Score = 3  {Confirm score is correct.  If not, click here to update score.  REFRESH note.  :1} This indicates a 3.2% annual risk of stroke. The patient's score is based upon: CHF History: 0 HTN History: 1 Diabetes History: 0 Stroke History: 0 Vascular Disease History: 0 Age Score: 2 Gender Score: 0   {This patient has a significant risk of stroke if diagnosed with atrial fibrillation.  Please consider VKA or DOAC agent for anticoagulation if the bleeding risk is acceptable.   You can also use the SmartPhrase .[email or web address] for documentation.   :[id number]}      Objective:    Vital Signs:  Pulse 70   Ht 5\' 11"  (1.803 m)   Wt 186 lb (84.4 kg)   BMI 25.94 kg/m    VITAL SIGNS:  reviewed GEN:  no acute distress EYES:  sclerae anicteric, EOMI - Extraocular Movements Intact RESPIRATORY:  normal respiratory effort, symmetric expansion CARDIOVASCULAR:  no peripheral edema SKIN:  no rash, lesions or ulcers. MUSCULOSKELETAL:  no obvious deformities. NEURO:  alert and oriented x 3, no obvious focal deficit PSYCH:  normal affect  ASSESSMENT & PLAN:    OSA - The patient is tolerating PAP therapy well without any problems. The PAP download performed by his DME was personally reviewed and interpreted by me today and showed an AHI of 3.6/hr on 10 cm H2O with  73% compliance in using more than 4 hours nightly.  The patient has been using and benefiting from PAP use and will continue to benefit from therapy.   Hypertension -BP controlled -Continue prescription drug managed with amlodipine 10 mg [name], HCTZ 12.5 mg [name] and Toprol-XL 37.5 mg [name] with as needed refills   Time:   Today, I have spent 20 minutes with the patient with telehealth technology discussing the above problems.     Medication Adjustments/Labs and Tests Ordered: Current medicines are reviewed at length with the patient today.  Concerns regarding medicines are outlined above.   Tests Ordered: No  orders of the defined types were placed in this encounter.   Medication Changes: No orders of the defined types were placed in this encounter.   Follow Up:  In Person in 1 year(s)  Signed, Armanda Magic, MD  01/03/2022 8:26 AM    [facility]

## 2022-01-03 ENCOUNTER — Encounter: Payer: Self-pay | Admitting: Cardiology

## 2022-01-03 ENCOUNTER — Ambulatory Visit: Payer: Medicare PPO | Attending: Cardiology | Admitting: Cardiology

## 2022-01-03 VITALS — HR 70 | Ht 71.0 in | Wt 186.0 lb

## 2022-01-03 DIAGNOSIS — G4733 Obstructive sleep apnea (adult) (pediatric): Secondary | ICD-10-CM

## 2022-01-03 DIAGNOSIS — I10 Essential (primary) hypertension: Secondary | ICD-10-CM

## 2022-01-03 NOTE — Patient Instructions (Signed)
Medication Instructions:  Your physician recommends that you continue on your current medications as directed. Please refer to the Current Medication list given to you today.  *If you need a refill on your cardiac medications before your next appointment, please call your pharmacy*   Lab Work: None. If you have labs (blood work) drawn today and your tests are completely normal, you will receive your results only by: [place] (if you have MyChart) OR A paper copy in the mail If you have any lab test that is abnormal or we need to change your treatment, we will call you to review the results.   Testing/Procedures: None.   Follow-Up: At [facility], you and your health needs are our priority.  As part of our continuing mission to provide you with exceptional heart care, we have created designated Provider Care Teams.  These Care Teams include your primary Cardiologist (physician) and Advanced Practice Providers (APPs -  Physician Assistants and Nurse Practitioners) who all work together to provide you with the care you need, when you need it.  We recommend signing up for the patient portal called "MyChart".  Sign up information is provided on this After Visit Summary.  MyChart is used to connect with patients for Virtual Visits (Telemedicine).  Patients are able to view lab/test results, encounter notes, upcoming appointments, etc.  Non-urgent messages can be sent to your provider as well.   To learn more about what you can do with MyChart, go to [email or web address].    Your next appointment:  01/31/22 at 2:40 pm.  The format for your next appointment:   In Person  Provider:   Dr. Fransico Him, MD      Important Information About Sugar

## 2022-01-12 DIAGNOSIS — G4733 Obstructive sleep apnea (adult) (pediatric): Secondary | ICD-10-CM | POA: Diagnosis not present

## 2022-01-12 DIAGNOSIS — I10 Essential (primary) hypertension: Secondary | ICD-10-CM | POA: Diagnosis not present

## 2022-01-12 DIAGNOSIS — R0683 Snoring: Secondary | ICD-10-CM | POA: Diagnosis not present

## 2022-01-26 ENCOUNTER — Ambulatory Visit
Admission: RE | Admit: 2022-01-26 | Discharge: 2022-01-26 | Disposition: A | Discharge status: Home / Self Care | Payer: Medicare PPO | Source: Ambulatory Visit | Attending: Family Medicine | Admitting: Family Medicine

## 2022-01-26 DIAGNOSIS — S22010D Wedge compression fracture of first thoracic vertebra, subsequent encounter for fracture with routine healing: Secondary | ICD-10-CM

## 2022-01-26 DIAGNOSIS — M85852 Other specified disorders of bone density and structure, left thigh: Secondary | ICD-10-CM | POA: Diagnosis not present

## 2022-01-31 ENCOUNTER — Other Ambulatory Visit: Payer: Self-pay | Admitting: Internal Medicine

## 2022-01-31 ENCOUNTER — Ambulatory Visit: Payer: Medicare PPO | Attending: Cardiology | Admitting: Cardiology

## 2022-01-31 ENCOUNTER — Encounter: Payer: Self-pay | Admitting: Cardiology

## 2022-01-31 VITALS — BP 116/58 | HR 68 | Ht 71.0 in | Wt 196.2 lb

## 2022-01-31 DIAGNOSIS — I10 Essential (primary) hypertension: Secondary | ICD-10-CM | POA: Diagnosis not present

## 2022-01-31 DIAGNOSIS — G4733 Obstructive sleep apnea (adult) (pediatric): Secondary | ICD-10-CM | POA: Diagnosis not present

## 2022-01-31 NOTE — Progress Notes (Signed)
Sleep Medicine CONSULT Note    Date:  01/31/2022   ID:  [name], [name] May 03, 1945, MRN [id number]  PCP:  Glenis Smoker, MD  Cardiologist: Early Osmond, MD   Chief Complaint  Patient presents with   New Patient (Initial Visit)    OSA    History of Present Illness:  Wesley [name] is a 77 y.o. male who is being seen today for the evaluation of OSA  at the request of Deirdre Pippins, MD.  This is a 77 year old male with a history of hyperlipidemia, PAF and hypertension.  He was seen by Dr. Lenna Sciara in April 2023 for his atrial fibrillation.  He complained at that time that he was having problems snoring.  He underwent in lab PSG  which showed mild obstructive sleep apnea with an AHI of 8/h and nocturnal hypoxemia.  He underwent CPAP titration to 10 cm H2O.  He is now referred for sleep medicine consult for treatment of obstructive sleep apnea.  He tells me prior to going on PAP he would tired when he would wake up in the am and would get sleepy during the day and takes a nap in the afternoon.  He did not ever waking up gasping for breath but her wife said that he snored.    He is doing well with his PAP device and thinks that he has gotten used to it.  He tolerates the full face mask and feels the pressure may be too high.  He says that his muscle in his upper abdomen are sore in the am and thinks he is getting too much air.  He does not belch any with it. It only occurs after he stopped the PAP in the am.    Since going on PAP he feels rested in the am and has no significant daytime sleepiness.  He has enlarged prostate so he wakes up a lot to urinate at night. He denies any significant mouth or nasal dryness or nasal congestion.  He does not think that he snores.    Past Medical History:  Diagnosis Date   Depression    Diverticulosis    Hyperlipidemia    Hypertension    Migraine    OSA on CPAP    mild obstructive sleep apnea with an AHI of 8 and O2  saturations as low as 85%.  On CPAP 10cm H2O    Past Surgical History:  Procedure Laterality Date   CARDIOVERSION N/A 05/24/2021   Procedure: CARDIOVERSION;  Surgeon: Donato Heinz, MD;  Location: [facility];  Service: Cardiovascular;  Laterality: N/A;   CYSTOSCOPY WITH INSERTION OF UROLIFT N/A 04/28/2019   Procedure: CYSTOSCOPY WITH INSERTION OF UROLIFT;  Surgeon: Festus Aloe, MD;  Location: [facility];  Service: Urology;  Laterality: N/A;   GANGLION CYST EXCISION     PROSTATE BIOPSY N/A 04/28/2019   Procedure: BIOPSY TRANSRECTAL ULTRASONIC PROSTATE (TUBP);  Surgeon: Festus Aloe, MD;  Location: [facility];  Service: Urology;  Laterality: N/A;    Current Medications: Current Meds  Medication Sig   amLODipine (NORVASC) 10 MG tablet Take 10 mg by mouth daily.   apixaban (ELIQUIS) 5 MG TABS tablet Take 1 tablet (5 mg total) by mouth 2 (two) times daily.   atorvastatin (LIPITOR) 10 MG tablet Take 10 mg by mouth daily.   buPROPion (WELLBUTRIN XL) 150 MG 24 hr tablet Take 150 mg by mouth daily.   hydrochlorothiazide (MICROZIDE) 12.5 MG  capsule Take 12.5 mg by mouth daily.   metoprolol succinate (TOPROL-XL) 25 MG 24 hr tablet TAKE 1 AND 1/2 TABLETS(37.5 MG) BY MOUTH DAILY   tamsulosin (FLOMAX) 0.4 MG CAPS capsule Take 0.4 mg by mouth daily after breakfast.   valACYclovir (VALTREX) 1000 MG tablet Take 1,000 mg by mouth every 12 (twelve) hours as needed (for flare up).     Allergies:   Ivp dye [iodinated contrast media], Ambien [zolpidem], Iohexol, Penicillins, Percocet [oxycodone-acetaminophen], and Zetia [ezetimibe]   Social History   Socioeconomic History   Marital status: Married    Spouse name: Not on file   Number of children: Not on file   Years of education: Not on file   Highest education level: Not on file  Occupational History   Not on file  Tobacco Use   Smoking status: Never   Smokeless tobacco: Former    Types: Chew   Substance and Sexual Activity   Alcohol use: Yes    Alcohol/week: 1.0 standard drink of alcohol    Types: 1 Glasses of wine per week    Comment: Daily.   Drug use: No   Sexual activity: Yes  Other Topics Concern   Not on file  Social History Narrative   Not on file   Social Determinants of Health   Financial Resource Strain: Not on file  Food Insecurity: Not on file  Transportation Needs: Not on file  Physical Activity: Not on file  Stress: Not on file  Social Connections: Not on file     Family History:  The patient's family history includes Heart disease in his mother; Pneumonia in his father.   ROS:   Please see the history of present illness.    ROS All other systems reviewed and are negative.      No data to display             PHYSICAL EXAM:   VS:  BP (!) 116/58   Pulse 68   Ht '5\' 11"'$  (1.803 m)   Wt 196 lb 3.2 oz (89 kg)   SpO2 94%   BMI 27.36 kg/m    GEN: Well nourished, well developed, in no acute distress  HEENT: normal  Neck: no JVD, carotid bruits, or masses Cardiac: RRR; no murmurs, rubs, or gallops,no edema.  Intact distal pulses bilaterally.  Respiratory:  clear to auscultation bilaterally, normal work of breathing GI: soft, nontender, nondistended, + BS MS: no deformity or atrophy  Skin: warm and dry, no rash Neuro:  Alert and Oriented x 3, Strength and sensation are intact Psych: euthymic mood, full affect  Wt Readings from Last 3 Encounters:  01/31/22 196 lb 3.2 oz (89 kg)  01/03/22 186 lb (84.4 kg)  08/09/21 183 lb (83 kg)      Studies/Labs Reviewed:   PSG, CPAP titration, PAP compliance download  Recent Labs: 05/24/2021: BUN 19; Creatinine, Ser 1.20; Hemoglobin 17.3; Potassium 4.0; Sodium 138    CHA2DS2-VASc Score = 3   This indicates a 3.2% annual risk of stroke. The patient's score is based upon: CHF History: 0 HTN History: 1 Diabetes History: 0 Stroke History: 0 Vascular Disease History: 0 Age Score: 2 Gender  Score: 0          Additional studies/ records that were reviewed today include:  none    ASSESSMENT:    1. OSA (obstructive sleep apnea)   2. Hypertension, unspecified type      PLAN:  In order of problems listed above:  OSA - The patient is tolerating PAP therapy well without any problems. The PAP download performed by his DME was personally reviewed and interpreted by me today and showed an AHI of 3.2/hr on 10 cm H2O with 63% compliance in using more than 4 hours nightly.  The patient has been using and benefiting from PAP use and will continue to benefit from therapy.  -since he feels somewhat bloated in the am and muscles in upper abdomen are sore, I am going to change him to auto PAP from 4 to 12cm H2O and get a download in 4 weeks. -encouraged him to be more compliant with his device -I will order him new supplies  HTN -BP controlled on exam -Continue prescription drug management with amlodipine 10 mg daily, HCTZ 12.5 mg daily, Toprol-XL 37.5 mg daily with as needed refills  Time Spent: 20 minutes total time of encounter, including 15 minutes spent in face-to-face patient care on the date of this encounter. This time includes coordination of care and counseling regarding above mentioned problem list. Remainder of non-face-to-face time involved reviewing chart documents/testing relevant to the patient encounter and documentation in the medical record. I have independently reviewed documentation from referring provider  Medication Adjustments/Labs and Tests Ordered: Current medicines are reviewed at length with the patient today.  Concerns regarding medicines are outlined above.  Medication changes, Labs and Tests ordered today are listed in the Patient Instructions below.  There are no Patient Instructions on file for this visit.   Signed, Fransico Him, MD  01/31/2022 2:30 PM    [address], [place], [place]  [id number] Phone: [phone]; Fax: [phone]

## 2022-01-31 NOTE — Patient Instructions (Signed)
Medication Instructions:  Your physician recommends that you continue on your current medications as directed. Please refer to the Current Medication list given to you today.  *If you need a refill on your cardiac medications before your next appointment, please call your pharmacy*   Lab Work: None.  If you have labs (blood work) drawn today and your tests are completely normal, you will receive your results only by: [place] (if you have MyChart) OR A paper copy in the mail If you have any lab test that is abnormal or we need to change your treatment, we will call you to review the results.   Testing/Procedures: None.   Follow-Up: At [facility], you and your health needs are our priority.  As part of our continuing mission to provide you with exceptional heart care, we have created designated Provider Care Teams.  These Care Teams include your primary Cardiologist (physician) and Advanced Practice Providers (APPs -  Physician Assistants and Nurse Practitioners) who all work together to provide you with the care you need, when you need it.  We recommend signing up for the patient portal called "MyChart".  Sign up information is provided on this After Visit Summary.  MyChart is used to connect with patients for Virtual Visits (Telemedicine).  Patients are able to view lab/test results, encounter notes, upcoming appointments, etc.  Non-urgent messages can be sent to your provider as well.   To learn more about what you can do with MyChart, go to [email or web address].    Your next appointment:   1 year(s)  Provider:   Dr. Fransico Him, MD    Other Instructions Dr. Radford Pax has ordered a change to your cpap settings. She has also sent an order to your DME company for supplies. A member of our sleep team may reach out to you to coordinate these things.

## 2022-02-02 ENCOUNTER — Telehealth: Payer: Self-pay | Admitting: *Deleted

## 2022-02-02 DIAGNOSIS — G4733 Obstructive sleep apnea (adult) (pediatric): Secondary | ICD-10-CM

## 2022-02-02 NOTE — Telephone Encounter (Signed)
Order placed to [employer] via community message 

## 2022-02-02 NOTE — Telephone Encounter (Signed)
-----   Message from Joni Reining, RN sent at 01/31/2022  2:42 PM EST ----- Regarding: DME/CPAP [facility]! Dr. Radford Pax would like to change this patient's cpap settings to 4-12 cm H20. He also needs an order for supplies and a download in 4 weeks.  Thank you, Danae Chen

## 2022-02-06 ENCOUNTER — Encounter: Payer: Self-pay | Admitting: Cardiology

## 2022-02-07 DIAGNOSIS — I10 Essential (primary) hypertension: Secondary | ICD-10-CM | POA: Diagnosis not present

## 2022-02-07 DIAGNOSIS — G4733 Obstructive sleep apnea (adult) (pediatric): Secondary | ICD-10-CM | POA: Diagnosis not present

## 2022-02-12 DIAGNOSIS — I10 Essential (primary) hypertension: Secondary | ICD-10-CM | POA: Diagnosis not present

## 2022-02-12 DIAGNOSIS — G4733 Obstructive sleep apnea (adult) (pediatric): Secondary | ICD-10-CM | POA: Diagnosis not present

## 2022-02-12 DIAGNOSIS — R0683 Snoring: Secondary | ICD-10-CM | POA: Diagnosis not present

## 2022-03-08 DIAGNOSIS — M170 Bilateral primary osteoarthritis of knee: Secondary | ICD-10-CM | POA: Diagnosis not present

## 2022-03-09 DIAGNOSIS — L570 Actinic keratosis: Secondary | ICD-10-CM | POA: Diagnosis not present

## 2022-03-09 DIAGNOSIS — L821 Other seborrheic keratosis: Secondary | ICD-10-CM | POA: Diagnosis not present

## 2022-03-09 DIAGNOSIS — D225 Melanocytic nevi of trunk: Secondary | ICD-10-CM | POA: Diagnosis not present

## 2022-03-09 DIAGNOSIS — L814 Other melanin hyperpigmentation: Secondary | ICD-10-CM | POA: Diagnosis not present

## 2022-03-13 DIAGNOSIS — R0683 Snoring: Secondary | ICD-10-CM | POA: Diagnosis not present

## 2022-03-13 DIAGNOSIS — G4733 Obstructive sleep apnea (adult) (pediatric): Secondary | ICD-10-CM | POA: Diagnosis not present

## 2022-03-13 DIAGNOSIS — I10 Essential (primary) hypertension: Secondary | ICD-10-CM | POA: Diagnosis not present

## 2022-03-15 DIAGNOSIS — M170 Bilateral primary osteoarthritis of knee: Secondary | ICD-10-CM | POA: Diagnosis not present

## 2022-03-29 DIAGNOSIS — M170 Bilateral primary osteoarthritis of knee: Secondary | ICD-10-CM | POA: Diagnosis not present

## 2022-04-09 DIAGNOSIS — I10 Essential (primary) hypertension: Secondary | ICD-10-CM | POA: Diagnosis not present

## 2022-04-09 DIAGNOSIS — Z9181 History of falling: Secondary | ICD-10-CM | POA: Diagnosis not present

## 2022-04-09 DIAGNOSIS — R7303 Prediabetes: Secondary | ICD-10-CM | POA: Diagnosis not present

## 2022-04-09 DIAGNOSIS — I700 Atherosclerosis of aorta: Secondary | ICD-10-CM | POA: Diagnosis not present

## 2022-04-09 DIAGNOSIS — I4891 Unspecified atrial fibrillation: Secondary | ICD-10-CM | POA: Diagnosis not present

## 2022-04-09 DIAGNOSIS — C61 Malignant neoplasm of prostate: Secondary | ICD-10-CM | POA: Diagnosis not present

## 2022-04-09 DIAGNOSIS — F330 Major depressive disorder, recurrent, mild: Secondary | ICD-10-CM | POA: Diagnosis not present

## 2022-04-09 DIAGNOSIS — D6869 Other thrombophilia: Secondary | ICD-10-CM | POA: Diagnosis not present

## 2022-04-09 DIAGNOSIS — Z0000 Encounter for general adult medical examination without abnormal findings: Secondary | ICD-10-CM | POA: Diagnosis not present

## 2022-04-09 DIAGNOSIS — E7800 Pure hypercholesterolemia, unspecified: Secondary | ICD-10-CM | POA: Diagnosis not present

## 2022-04-13 DIAGNOSIS — R0683 Snoring: Secondary | ICD-10-CM | POA: Diagnosis not present

## 2022-04-13 DIAGNOSIS — G4733 Obstructive sleep apnea (adult) (pediatric): Secondary | ICD-10-CM | POA: Diagnosis not present

## 2022-04-13 DIAGNOSIS — I10 Essential (primary) hypertension: Secondary | ICD-10-CM | POA: Diagnosis not present

## 2022-04-26 ENCOUNTER — Encounter: Payer: Self-pay | Admitting: Internal Medicine

## 2022-05-07 ENCOUNTER — Telehealth: Payer: Self-pay | Admitting: *Deleted

## 2022-05-07 DIAGNOSIS — I4891 Unspecified atrial fibrillation: Secondary | ICD-10-CM

## 2022-05-07 MED ORDER — METOPROLOL SUCCINATE ER 50 MG PO TB24: 50.0000 mg | ORAL_TABLET | Freq: Every day | ORAL | 3 refills | Status: DC

## 2022-05-07 NOTE — Telephone Encounter (Signed)
-----   Message from Orbie Pyo, MD sent at 04/26/2022  6:12 PM EDT ----- Regarding: CORRECTION I had sent a message about starting cardizem.  Please disregard.  We need a limited TTE now that he is in NSR to make sure his EF is good before making that change. In the mean time until the TTE, have him take 1/2 pill of toprol at night.

## 2022-05-07 NOTE — Telephone Encounter (Signed)
Reviewed with Dr. Lynnette Caffey. The patient is already taking Toprol XL 37.5 mg daily.  Per Dr. Lynnette Caffey, increase Toprol XL to 50 mg daily at bedtime.  Limited echo and Toprol XL 50 mg orders placed.  Echo scheduled.  Pt notified via mychart

## 2022-05-07 NOTE — Addendum Note (Signed)
Addended by: Lendon Ka on: 05/07/2022 03:22 PM   Modules accepted: Orders

## 2022-05-13 DIAGNOSIS — G4733 Obstructive sleep apnea (adult) (pediatric): Secondary | ICD-10-CM | POA: Diagnosis not present

## 2022-05-13 DIAGNOSIS — R0683 Snoring: Secondary | ICD-10-CM | POA: Diagnosis not present

## 2022-05-13 DIAGNOSIS — I10 Essential (primary) hypertension: Secondary | ICD-10-CM | POA: Diagnosis not present

## 2022-06-05 ENCOUNTER — Encounter: Payer: Self-pay | Admitting: Internal Medicine

## 2022-06-05 ENCOUNTER — Ambulatory Visit (HOSPITAL_COMMUNITY): Payer: Medicare PPO | Attending: Internal Medicine

## 2022-06-05 DIAGNOSIS — I4891 Unspecified atrial fibrillation: Secondary | ICD-10-CM

## 2022-06-05 LAB — ECHOCARDIOGRAM LIMITED
Area-P 1/2: 2.8 cm2
S' Lateral: 3.5 cm

## 2022-06-06 DIAGNOSIS — M170 Bilateral primary osteoarthritis of knee: Secondary | ICD-10-CM | POA: Diagnosis not present

## 2022-06-06 DIAGNOSIS — M1711 Unilateral primary osteoarthritis, right knee: Secondary | ICD-10-CM | POA: Diagnosis not present

## 2022-06-06 DIAGNOSIS — M1712 Unilateral primary osteoarthritis, left knee: Secondary | ICD-10-CM | POA: Diagnosis not present

## 2022-06-13 DIAGNOSIS — I10 Essential (primary) hypertension: Secondary | ICD-10-CM | POA: Diagnosis not present

## 2022-06-13 DIAGNOSIS — G4733 Obstructive sleep apnea (adult) (pediatric): Secondary | ICD-10-CM | POA: Diagnosis not present

## 2022-06-13 DIAGNOSIS — R0683 Snoring: Secondary | ICD-10-CM | POA: Diagnosis not present

## 2022-07-09 ENCOUNTER — Other Ambulatory Visit: Payer: Self-pay | Admitting: Internal Medicine

## 2022-07-09 NOTE — Telephone Encounter (Signed)
Prescription refill request for Eliquis received. Indication: AF Last office visit: 06/23/21  Carmina Miller MD Scr: 1.3 on 04/09/22  KPN Age: 77 Weight: 83kg  Based on above findings Eliquis 5mg  twice daily is the appropriate dose.  Pt is due for appt with MD.  Message sent to schedulers to make appt. Refill approved x 2

## 2022-07-13 DIAGNOSIS — R0683 Snoring: Secondary | ICD-10-CM | POA: Diagnosis not present

## 2022-07-13 DIAGNOSIS — G4733 Obstructive sleep apnea (adult) (pediatric): Secondary | ICD-10-CM | POA: Diagnosis not present

## 2022-07-13 DIAGNOSIS — I10 Essential (primary) hypertension: Secondary | ICD-10-CM | POA: Diagnosis not present

## 2022-07-23 ENCOUNTER — Ambulatory Visit (HOSPITAL_BASED_OUTPATIENT_CLINIC_OR_DEPARTMENT_OTHER): Payer: Medicare PPO | Admitting: Family

## 2022-07-23 ENCOUNTER — Encounter (HOSPITAL_BASED_OUTPATIENT_CLINIC_OR_DEPARTMENT_OTHER): Payer: Self-pay | Admitting: Family

## 2022-07-23 ENCOUNTER — Encounter: Payer: Self-pay | Admitting: Internal Medicine

## 2022-07-23 VITALS — BP 110/68 | HR 82 | Ht 71.0 in | Wt 188.9 lb

## 2022-07-23 DIAGNOSIS — I700 Atherosclerosis of aorta: Secondary | ICD-10-CM | POA: Diagnosis not present

## 2022-07-23 DIAGNOSIS — I10 Essential (primary) hypertension: Secondary | ICD-10-CM | POA: Diagnosis not present

## 2022-07-23 DIAGNOSIS — R2681 Unsteadiness on feet: Secondary | ICD-10-CM | POA: Diagnosis not present

## 2022-07-23 DIAGNOSIS — I480 Paroxysmal atrial fibrillation: Secondary | ICD-10-CM

## 2022-07-23 DIAGNOSIS — I4891 Unspecified atrial fibrillation: Secondary | ICD-10-CM | POA: Diagnosis not present

## 2022-07-23 DIAGNOSIS — D6859 Other primary thrombophilia: Secondary | ICD-10-CM | POA: Diagnosis not present

## 2022-07-23 DIAGNOSIS — R42 Dizziness and giddiness: Secondary | ICD-10-CM

## 2022-07-23 DIAGNOSIS — G4733 Obstructive sleep apnea (adult) (pediatric): Secondary | ICD-10-CM | POA: Diagnosis not present

## 2022-07-23 DIAGNOSIS — E782 Mixed hyperlipidemia: Secondary | ICD-10-CM

## 2022-07-23 MED ORDER — DILTIAZEM HCL ER COATED BEADS 120 MG PO CP24: 120.0000 mg | ORAL_CAPSULE | Freq: Every day | ORAL | 2 refills | Status: DC

## 2022-07-23 NOTE — Patient Instructions (Addendum)
Medication Instructions:  Your physician has recommended you make the following change in your medication:   STOP Metoprolol and Amlodipine   START Diltiazem 120mg  daily  *If you need a refill on your cardiac medications before your next appointment, please call your pharmacy*  Lab Work: Your physician recommends that you return for lab work today: CBC, BMP, magnesium, TSH  If you have labs (blood work) drawn today and your tests are completely normal, you will receive your results only by: MyChart Message (if you have MyChart) OR A paper copy in the mail If you have any lab test that is abnormal or we need to change your treatment, we will call you to review the results.  Follow-Up: At [facility], you and your health needs are our priority.  As part of our continuing mission to provide you with exceptional heart care, we have created designated Provider Care Teams.  These Care Teams include your primary Cardiologist (physician) and Advanced Practice Providers (APPs -  Physician Assistants and Nurse Practitioners) who all work together to provide you with the care you need, when you need it.  We recommend signing up for the patient portal called "MyChart".  Sign up information is provided on this After Visit Summary.  MyChart is used to connect with patients for Virtual Visits (Telemedicine).  Patients are able to view lab/test results, encounter notes, upcoming appointments, etc.  Non-urgent messages can be sent to your provider as well.   To learn more about what you can do with MyChart, go to [email or web address].    Your next appointment:   2 weeks after cardioversion with Dr. Lynnette Caffey or APP  Other Instructions  We've referred you to physical therapy at [facility] If you haven't heard from in about a week, recommend calling them at [phone].     You are scheduled for a Cardioversion on Tuesday, July 30 with Dr. Servando Salina.  Please arrive at the [facility] (Main  Entrance A) at [facility]: [address] [place], [place] [address] at 7:30 AM (This time is 1 hour(s) before your procedure to ensure your preparation). Free valet parking service is available. You will check in at ADMITTING. The support person will be asked to wait in the waiting room.  It is OK to have someone drop you off and come back when you are ready to be discharged.      DIET:  Nothing to eat or drink after midnight except a sip of water with medications (see medication instructions below)  MEDICATION INSTRUCTIONS: !!IF ANY NEW MEDICATIONS ARE STARTED AFTER TODAY, PLEASE NOTIFY YOUR PROVIDER AS SOON AS POSSIBLE!!  FYI: Medications such as Semaglutide (Ozempic, [place]), Tirzepatide (Mounjaro, Zepbound), Dulaglutide (Trulicity), etc ("GLP1 agonists") AND Canagliflozin (Invokana), Dapagliflozin (Farxiga), Empagliflozin (Jardiance), Ertugliflozin (Steglatro), Bexagliflozin [employer]) or any combination with one of these drugs such as Invokamet (Canagliflozin/Metformin), Synjardy (Empagliflozin/Metformin), etc ("SGLT2 inhibitors") must be held around the time of a procedure. This is not a comprehensive list of all of these drugs. Please review all of your medications and talk to your provider if you take any one of these. If you are not sure, ask your provider.     Continue taking your anticoagulant (blood thinner): Apixaban (Eliquis).  You will need to continue this after your procedure until you are told by your provider that it is safe to stop.    LABS:  TODAY  FYI:  For your safety, and to allow [place] to monitor your vital signs accurately during the  surgery/procedure we request: If you have artificial nails, gel coating, SNS etc, please have those removed prior to your surgery/procedure. Not having the nail coverings /polish removed may result in cancellation or delay of your surgery/procedure.  You must have a responsible person to drive you home and stay in the waiting area  during your procedure. Failure to do so could result in cancellation.  Bring your insurance cards.  *Special Note: Every effort is made to have your procedure done on time. Occasionally there are emergencies that occur at the hospital that may cause delays. Please be patient if a delay does occur.

## 2022-07-23 NOTE — H&P (View-Only) (Signed)
Cardiology Office Note:  .   Date:  07/23/2022  ID:  Wesley [name], Wesley [name] [birth year], MRN [id number] PCP: Shon Hale, MD  [facility] Providers Cardiologist:  Orbie Pyo, MD    History of Present Illness: Wesley [name] is a 77 y.o. male atrial fibrillation, hyperlipidemia, hypertension, aortic atherosclerosis, OSA .  Established April 2023 for atrial fibrillation.  Toprol increased to 37.5 mg and apixaban continue.  Referred for sleep study revealing sleep apnea.  He underwent cardioversion with successful restoration of NSR 05/24/2021.  Presents today for follow-up.  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  Notes recurrent atrial fibrillation associated with fatigue, exercise intolerance. Most symptomatic when laying down with atrial fibrillation. Feels very unsteady on his feet and he and his wife are concerned about falls  Reports no chest pain, pressure, or tightness. No edema, orthopnea, PND.   ROS: Please see the history of present illness.    All other systems reviewed and are negative.   Studies Reviewed: Marland Kitchen   EKG Interpretation Date/Time:  Monday July 23 2022 13:28:28 EDT Ventricular Rate:  82 PR Interval:    QRS Duration:  94 QT Interval:  290 QTC Calculation: 338 R Axis:   37  Text Interpretation: Rate controlled atrial fibrillation. Confirmed by Gillian Shields ([id number]) on 07/23/2022 1:50:29 PM    Cardiac Studies & Procedures       ECHOCARDIOGRAM  ECHOCARDIOGRAM LIMITED 06/05/2022  Narrative ECHOCARDIOGRAM LIMITED REPORT    Patient Name:   Wesley [name] [facility] Date of Exam: 06/05/2022 Medical Rec #:  [id number]         Height:       71.0 in Accession #:    [id number]        Weight:       196.2 lb Date of Birth:  October 12, 1945        BSA:          2.091 m Patient Age:    76 years          BP:           116/58 mmHg Patient Gender: M                 HR:           55 bpm. Exam Location:  [facility]  Procedure: Cardiac  Doppler, Limited Echo, Limited Color Doppler and 3D Echo  Indications:    I48.91 Atrial fibrillation  History:        Patient has prior history of Echocardiogram examinations, most recent 04/17/2021. Arrythmias:Atrial Fibrillation; Risk Factors:Hypertension and Dyslipidemia.  Sonographer:    Samule Ohm RDCS Referring Phys: [id number] Orbie Pyo  IMPRESSIONS   1. Left ventricular ejection fraction, by estimation, is 60 to 65%. The left ventricle has normal function. 2. Right ventricular systolic function is normal. The right ventricular size is normal. 3. The mitral valve is normal in structure. No evidence of mitral valve regurgitation. 4. Aortic valve regurgitation is not visualized.  FINDINGS Left Ventricle: Left ventricular ejection fraction, by estimation, is 60 to 65%. The left ventricle has normal function. The left ventricular internal cavity size was normal in size. There is no left ventricular hypertrophy.  Right Ventricle: The right ventricular size is normal. Right ventricular systolic function is normal.  Pericardium: There is no evidence of pericardial effusion.  Mitral Valve: The mitral valve is normal in structure.  Tricuspid Valve: Tricuspid valve regurgitation is mild.  Aortic Valve:  Aortic valve regurgitation is not visualized.  Aorta: The aortic root and ascending aorta are structurally normal, with no evidence of dilitation.  LEFT VENTRICLE PLAX 2D LVIDd:         5.20 cm   Diastology LVIDs:         3.50 cm   LV e' medial:    8.38 cm/s LV PW:         1.10 cm   LV E/e' medial:  8.9 LV IVS:        0.80 cm   LV e' lateral:   10.30 cm/s LVOT diam:     2.00 cm   LV E/e' lateral: 7.2 LV SV:         59 LV SV Index:   28 LVOT Area:     3.14 cm  3D Volume EF: 3D EF:        60 % LV EDV:       96 ml LV ESV:       38 ml LV SV:        58 ml  RIGHT VENTRICLE RVSP:           31.1 mmHg  LEFT ATRIUM         Index       RIGHT ATRIUM LA diam:    4.80 cm  2.30 cm/m  RA Pressure: 3.00 mmHg AORTIC VALVE LVOT Vmax:   85.40 cm/s LVOT Vmean:  58.300 cm/s LVOT VTI:    0.189 m  AORTA Ao Root diam: 3.70 cm Ao Asc diam:  3.60 cm  MITRAL VALVE               TRICUSPID VALVE MV Area (PHT): 2.80 cm    TR Peak grad:   28.1 mmHg MV Decel Time: 271 msec    TR Vmax:        265.00 cm/s MV E velocity: 74.40 cm/s  Estimated RAP:  3.00 mmHg MV A velocity: 37.70 cm/s  RVSP:           31.1 mmHg MV E/A ratio:  1.97 SHUNTS Systemic VTI:  0.19 m Systemic Diam: 2.00 cm  Carolan Clines Electronically signed by Carolan Clines Signature Date/Time: 06/05/2022/6:37:53 PM    Final             Risk Assessment/Calculations:    CHA2DS2-VASc Score = 3   This indicates a 3.2% annual risk of stroke. The patient's score is based upon: CHF History: 0 HTN History: 1 Diabetes History: 0 Stroke History: 0 Vascular Disease History: 0 Age Score: 2 Gender Score: 0            Physical Exam:   VS:  BP 110/68 (BP Location: Left Arm, Patient Position: Sitting, Cuff Size: Normal)   Pulse 82   Ht 5\' 11"  (1.803 m)   Wt 188 lb 14.4 oz (85.7 kg)   BMI 26.35 kg/m    Wt Readings from Last 3 Encounters:  07/23/22 188 lb 14.4 oz (85.7 kg)  01/31/22 196 lb 3.2 oz (89 kg)  01/03/22 186 lb (84.4 kg)    GEN: Well nourished, well developed in no acute distress NECK: No JVD; No carotid bruits CARDIAC: IRIR, no murmurs, rubs, gallops RESPIRATORY:  Clear to auscultation without rales, wheezing or rhonchi  ABDOMEN: Soft, non-tender, non-distended EXTREMITIES:  No edema; No deformity   ASSESSMENT AND PLAN: .    PAF / Hypercoagulable state - EKG today recurrent atrial fibrillation which is symptomatic. Plan for cardioversion. No missed  doses Eliquis for the last 3 weeks. Continue Eliquis 5mg  BID - does not meet dose reduction criteria. Notes fatigue which he attributes to Metoprolol - stop metoprolol, start diltiazem 120mg  every day. Will discontinue Amlodipine to avoid  duplicate therapy. . CBC, BMP, TSH, magnesium today to assess for etiology of recurrent atrial fibrillation.   HTN - BP well controlled. Stop metoprolol, amlodipine - start Diltiazem 120mg  every day. Continue hydrochlorothiazide 12.5mg  every day.   OSA - CPAP compliance encouraged.   Gait instability - Concerned about falls. Refer to outpatient PT.   HLD / Aortic atherosclerosis - Stable with no anginal symptoms. No indication for ischemic evaluation.  Continue Atorvastatin 10mg  daily.     Informed Consent   Shared Decision Making/Informed Consent The risks [stroke (1 in 1000), death (1 in 1000), kidney failure [usually temporary] (1 in 500), bleeding (1 in 200), allergic reaction [possibly serious] (1 in 200)], benefits (diagnostic support and management of coronary artery disease) and alternatives of a cardiac catheterization were discussed in detail with Mr. Vantol and he is willing to proceed.     Dispo: follow up 2 weeks post cardioversion.  Signed, Alver Sorrow, NP

## 2022-07-23 NOTE — Progress Notes (Signed)
Cardiology Office Note:  .   Date:  07/23/2022  ID:  Osmond, Steckman Jul 06, 1945, MRN [id number] PCP: Shon Hale, MD  [facility] Providers Cardiologist:  Orbie Pyo, MD    History of Present Illness: SAIGE BUSBY is a 77 y.o. male atrial fibrillation, hyperlipidemia, hypertension, aortic atherosclerosis, OSA .  Established April 2023 for atrial fibrillation.  Toprol increased to 37.5 mg and apixaban continue.  Referred for sleep study revealing sleep apnea.  He underwent cardioversion with successful restoration of NSR 05/24/2021.  Presents today for follow-up.  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  Notes recurrent atrial fibrillation associated with fatigue, exercise intolerance. Most symptomatic when laying down with atrial fibrillation. Feels very unsteady on his feet and he and his wife are concerned about falls  Reports no chest pain, pressure, or tightness. No edema, orthopnea, PND.   ROS: Please see the history of present illness.    All other systems reviewed and are negative.   Studies Reviewed: Marland Kitchen   EKG Interpretation Date/Time:  Monday July 23 2022 13:28:28 EDT Ventricular Rate:  82 PR Interval:    QRS Duration:  94 QT Interval:  290 QTC Calculation: 338 R Axis:   37  Text Interpretation: Rate controlled atrial fibrillation. Confirmed by Gillian Shields ([id number]) on 07/23/2022 1:50:29 PM    Cardiac Studies & Procedures       ECHOCARDIOGRAM  ECHOCARDIOGRAM LIMITED 06/05/2022  Narrative ECHOCARDIOGRAM LIMITED REPORT    Patient Name:   YVON MCCORD [facility] Date of Exam: 06/05/2022 Medical Rec #:  [id number]         Height:       71.0 in Accession #:    [id number]        Weight:       196.2 lb Date of Birth:  Jun 23, 1945        BSA:          2.091 m Patient Age:    76 years          BP:           116/58 mmHg Patient Gender: M                 HR:           55 bpm. Exam Location:  [facility]  Procedure: Cardiac  Doppler, Limited Echo, Limited Color Doppler and 3D Echo  Indications:    I48.91 Atrial fibrillation  History:        Patient has prior history of Echocardiogram examinations, most recent 04/17/2021. Arrythmias:Atrial Fibrillation; Risk Factors:Hypertension and Dyslipidemia.  Sonographer:    Samule Ohm RDCS Referring Phys: [id number] Orbie Pyo  IMPRESSIONS   1. Left ventricular ejection fraction, by estimation, is 60 to 65%. The left ventricle has normal function. 2. Right ventricular systolic function is normal. The right ventricular size is normal. 3. The mitral valve is normal in structure. No evidence of mitral valve regurgitation. 4. Aortic valve regurgitation is not visualized.  FINDINGS Left Ventricle: Left ventricular ejection fraction, by estimation, is 60 to 65%. The left ventricle has normal function. The left ventricular internal cavity size was normal in size. There is no left ventricular hypertrophy.  Right Ventricle: The right ventricular size is normal. Right ventricular systolic function is normal.  Pericardium: There is no evidence of pericardial effusion.  Mitral Valve: The mitral valve is normal in structure.  Tricuspid Valve: Tricuspid valve regurgitation is mild.  Aortic Valve:  Aortic valve regurgitation is not visualized.  Aorta: The aortic root and ascending aorta are structurally normal, with no evidence of dilitation.  LEFT VENTRICLE PLAX 2D LVIDd:         5.20 cm   Diastology LVIDs:         3.50 cm   LV e' medial:    8.38 cm/s LV PW:         1.10 cm   LV E/e' medial:  8.9 LV IVS:        0.80 cm   LV e' lateral:   10.30 cm/s LVOT diam:     2.00 cm   LV E/e' lateral: 7.2 LV SV:         59 LV SV Index:   28 LVOT Area:     3.14 cm  3D Volume EF: 3D EF:        60 % LV EDV:       96 ml LV ESV:       38 ml LV SV:        58 ml  RIGHT VENTRICLE RVSP:           31.1 mmHg  LEFT ATRIUM         Index       RIGHT ATRIUM LA diam:    4.80 cm  2.30 cm/m  RA Pressure: 3.00 mmHg AORTIC VALVE LVOT Vmax:   85.40 cm/s LVOT Vmean:  58.300 cm/s LVOT VTI:    0.189 m  AORTA Ao Root diam: 3.70 cm Ao Asc diam:  3.60 cm  MITRAL VALVE               TRICUSPID VALVE MV Area (PHT): 2.80 cm    TR Peak grad:   28.1 mmHg MV Decel Time: 271 msec    TR Vmax:        265.00 cm/s MV E velocity: 74.40 cm/s  Estimated RAP:  3.00 mmHg MV A velocity: 37.70 cm/s  RVSP:           31.1 mmHg MV E/A ratio:  1.97 SHUNTS Systemic VTI:  0.19 m Systemic Diam: 2.00 cm  Carolan Clines Electronically signed by Carolan Clines Signature Date/Time: 06/05/2022/6:37:53 PM    Final             Risk Assessment/Calculations:    CHA2DS2-VASc Score = 3   This indicates a 3.2% annual risk of stroke. The patient's score is based upon: CHF History: 0 HTN History: 1 Diabetes History: 0 Stroke History: 0 Vascular Disease History: 0 Age Score: 2 Gender Score: 0            Physical Exam:   VS:  BP 110/68 (BP Location: Left Arm, Patient Position: Sitting, Cuff Size: Normal)   Pulse 82   Ht 5\' 11"  (1.803 m)   Wt 188 lb 14.4 oz (85.7 kg)   BMI 26.35 kg/m    Wt Readings from Last 3 Encounters:  07/23/22 188 lb 14.4 oz (85.7 kg)  01/31/22 196 lb 3.2 oz (89 kg)  01/03/22 186 lb (84.4 kg)    GEN: Well nourished, well developed in no acute distress NECK: No JVD; No carotid bruits CARDIAC: IRIR, no murmurs, rubs, gallops RESPIRATORY:  Clear to auscultation without rales, wheezing or rhonchi  ABDOMEN: Soft, non-tender, non-distended EXTREMITIES:  No edema; No deformity   ASSESSMENT AND PLAN: .    PAF / Hypercoagulable state - EKG today recurrent atrial fibrillation which is symptomatic. Plan for cardioversion. No missed  doses Eliquis for the last 3 weeks. Continue Eliquis 5mg  BID - does not meet dose reduction criteria. Notes fatigue which he attributes to Metoprolol - stop metoprolol, start diltiazem 120mg  every day. Will discontinue Amlodipine to avoid  duplicate therapy. . CBC, BMP, TSH, magnesium today to assess for etiology of recurrent atrial fibrillation.   HTN - BP well controlled. Stop metoprolol, amlodipine - start Diltiazem 120mg  every day. Continue hydrochlorothiazide 12.5mg  every day.   OSA - CPAP compliance encouraged.   Gait instability - Concerned about falls. Refer to outpatient PT.   HLD / Aortic atherosclerosis - Stable with no anginal symptoms. No indication for ischemic evaluation.  Continue Atorvastatin 10mg  daily.     Informed Consent   Shared Decision Making/Informed Consent The risks [stroke (1 in 1000), death (1 in 1000), kidney failure [usually temporary] (1 in 500), bleeding (1 in 200), allergic reaction [possibly serious] (1 in 200)], benefits (diagnostic support and management of coronary artery disease) and alternatives of a cardiac catheterization were discussed in detail with Mr. Wenger and he is willing to proceed.     Dispo: follow up 2 weeks post cardioversion.  Signed, Alver Sorrow, NP

## 2022-07-24 ENCOUNTER — Encounter (HOSPITAL_BASED_OUTPATIENT_CLINIC_OR_DEPARTMENT_OTHER): Payer: Self-pay

## 2022-07-24 LAB — BASIC METABOLIC PANEL
BUN/Creatinine Ratio: 17 (ref 10–24)
BUN: 24 mg/dL (ref 8–27)
CO2: 24 mmol/L (ref 20–29)
Calcium: 9.7 mg/dL (ref 8.6–10.2)
Chloride: 103 mmol/L (ref 96–106)
Creatinine, Ser: 1.39 mg/dL — ABNORMAL HIGH (ref 0.76–1.27)
Glucose: 83 mg/dL (ref 70–99)
Potassium: 4.1 mmol/L (ref 3.5–5.2)
Sodium: 142 mmol/L (ref 134–144)
eGFR: 52 mL/min/{1.73_m2} — ABNORMAL LOW (ref >59)

## 2022-07-24 LAB — CBC
Hematocrit: 47.5 % (ref 37.5–51.0)
Hemoglobin: 16 g/dL (ref 13.0–17.7)
MCH: 30.8 pg (ref 26.6–33.0)
MCHC: 33.7 g/dL (ref 31.5–35.7)
MCV: 92 fL (ref 79–97)
Platelets: 269 10*3/uL (ref 150–450)
RBC: 5.19 x10E6/uL (ref 4.14–5.80)
RDW: 14.1 % (ref 11.6–15.4)
WBC: 7.2 10*3/uL (ref 3.4–10.8)

## 2022-07-24 LAB — TSH: TSH: 0.622 u[IU]/mL (ref 0.450–4.500)

## 2022-07-24 LAB — MAGNESIUM: Magnesium: 2.3 mg/dL (ref 1.6–2.3)

## 2022-07-26 ENCOUNTER — Other Ambulatory Visit: Payer: Self-pay

## 2022-07-26 ENCOUNTER — Telehealth: Payer: Self-pay | Admitting: Internal Medicine

## 2022-07-26 ENCOUNTER — Encounter: Payer: Self-pay | Admitting: Physical Therapy

## 2022-07-26 ENCOUNTER — Ambulatory Visit: Payer: Medicare PPO | Attending: Family | Admitting: Physical Therapy

## 2022-07-26 DIAGNOSIS — M6281 Muscle weakness (generalized): Secondary | ICD-10-CM | POA: Diagnosis not present

## 2022-07-26 DIAGNOSIS — R2689 Other abnormalities of gait and mobility: Secondary | ICD-10-CM | POA: Diagnosis not present

## 2022-07-26 DIAGNOSIS — R2681 Unsteadiness on feet: Secondary | ICD-10-CM | POA: Diagnosis not present

## 2022-07-26 NOTE — Telephone Encounter (Signed)
Attempted to contact Central Scheduling, no answer.  Called and left message for patient informing him of time change on cardioversion scheduled for 07/31/2022.  Also sent MyChart message with updated instruction letter reflecting new arrival time.  Provided office number for callback if any questions.

## 2022-07-26 NOTE — Therapy (Addendum)
OUTPATIENT PHYSICAL THERAPY NEURO EVALUATION   Patient Name: Wesley [name] MRN: [id number] DOB:23-Jun-1945, 77 y.o., male Today's Date: 07/27/2022   PCP:   Shon Hale, MD   REFERRING PROVIDER: Alver Sorrow, NP   END OF SESSION:  PT End of Session - 07/26/22 1157     Visit Number 1    Number of Visits 17    Date for PT Re-Evaluation 09/21/22    Authorization Type [employer]-submitted upon completion of eval    PT Start Time 1153    PT Stop Time 1228    PT Time Calculation (min) 35 min    Activity Tolerance Patient tolerated treatment well;Patient limited by fatigue    Behavior During Therapy WFL for tasks assessed/performed             Past Medical History:  Diagnosis Date   Depression    Diverticulosis    Hyperlipidemia    Hypertension    Migraine    OSA on CPAP    mild obstructive sleep apnea with an AHI of 8 and O2 saturations as low as 85%.  On CPAP 10cm H2O   Past Surgical History:  Procedure Laterality Date   CARDIOVERSION N/A 05/24/2021   Procedure: CARDIOVERSION;  Surgeon: Little Ishikawa, MD;  Location: [facility] ENDOSCOPY;  Service: Cardiovascular;  Laterality: N/A;   CYSTOSCOPY WITH INSERTION OF UROLIFT N/A 04/28/2019   Procedure: CYSTOSCOPY WITH INSERTION OF UROLIFT;  Surgeon: Jerilee Field, MD;  Location: [facility];  Service: Urology;  Laterality: N/A;   GANGLION CYST EXCISION     PROSTATE BIOPSY N/A 04/28/2019   Procedure: BIOPSY TRANSRECTAL ULTRASONIC PROSTATE (TUBP);  Surgeon: Jerilee Field, MD;  Location: [facility];  Service: Urology;  Laterality: N/A;   Patient Active Problem List   Diagnosis Date Noted   OSA on CPAP 01/03/2022   Persistent atrial fibrillation (HCC)    Diverticulitis of colon with perforation 12/18/2020   Bruxism 05/07/2017   Laryngopharyngeal reflux (LPR) 05/07/2017   Presbycusis of both ears 05/07/2017   Hemorrhoids, external, thrombosed 02/13/2012     ONSET DATE: 07/23/2022 (MD referral)  REFERRING DIAG: R26.81 (ICD-10-CM) - Gait instability   THERAPY DIAG:  Unsteadiness on feet  Other abnormalities of gait and mobility  Muscle weakness (generalized)  Rationale for Evaluation and Treatment: Rehabilitation  SUBJECTIVE:                                                                                                                                                                                             SUBJECTIVE STATEMENT: I'm not very active, and my balance is  not good.  Sometimes I'm dizzy and have to stabilize myself with walking.  Would like to work on balance and strength.  More of the sedate activity level has been going on for at least 6 months. Pt accompanied by: self  PERTINENT HISTORY: A-fib, scheduled for cardioversion  PAIN:  Are you having pain? No  PRECAUTIONS: Fall and Other: a-fib, cardioversion to be performed 07/31/2022  RED FLAGS: None   WEIGHT BEARING RESTRICTIONS: No  FALLS: Has patient fallen in last 6 months? Yes. Number of falls 2 in past year Usually when I first get up and don't have my balance. Have difficulty going down the stairs.  LIVING ENVIRONMENT: Lives with: lives with their spouse Lives in: House/apartment Stairs:  has second floor, but can live on first level.  Has 8 steps with L handrail Has following equipment at home: None  PLOF: Independent  Enjoys walking in neighborhood, travel.  Retired Optician, dispensing  PATIENT GOALS: To regain balance, strength  OBJECTIVE:   DIAGNOSTIC FINDINGS: NA for this episode  COGNITION: Overall cognitive status: Within functional limits for tasks assessed   SENSATION: WFL  POSTURE: rounded shoulders and forward head  LOWER EXTREMITY ROM:   A/ROM BLEs  LOWER EXTREMITY MMT:    MMT Right Eval Left Eval  Hip flexion 4 4  Hip extension    Hip abduction    Hip adduction    Hip internal rotation    Hip external rotation    Knee flexion  4+ 4+  Knee extension 4+ 4+  Ankle dorsiflexion 4 4  Ankle plantarflexion    Ankle inversion    Ankle eversion    (Blank rows = not tested)    TRANSFERS: Assistive device utilized: None  Sit to stand: Modified independence Stand to sit: Modified independence *Reports needing UE support to push up to get up from floor  GAIT: Gait pattern: step through pattern and wide BOS Distance walked: 320 ft Assistive device utilized: None Level of assistance: Modified independence  FUNCTIONAL TESTS:   Vitals:  133/88, HR 102 5 times sit to stand: 20.53 sec 7M walk:  12.3 sec (2.67 ft/sec) M-CTSIB  Condition 1: Firm Surface, EO 30 Sec, Mild Sway  Condition 2: Firm Surface, EC 30 Sec, Moderate Sway  Condition 3: Foam Surface, EO 30 Sec, Moderate Sway  Condition 4: Foam Surface, EC 6.6 Sec, Severe Sway      TODAY'S TREATMENT:                                                                                                                              DATE: 07/26/2022    PATIENT EDUCATION: Education details: Eval results, POC Person educated: Patient Education method: Explanation Education comprehension: verbalized understanding  HOME EXERCISE PROGRAM: Not yet initiated  GOALS: Goals reviewed with patient? Yes  SHORT TERM GOALS: Target date: 08/24/2022  Pt will be independent with HEP for improved strength, balance, gait. Baseline: Goal status: INITIAL  2.  Pt will improve 5x sit<>stand to less than or equal to 15 sec to demonstrate improved functional strength and transfer efficiency. Baseline: 20.53 sec Goal status: INITIAL   LONG TERM GOALS: Target date: 09/21/2022  Pt will be independent with HEP for improved strength, balance, gait. Baseline:  Goal status: INITIAL  2.  Pt will improve 5x sit<>stand to less than or equal to 12.5 sec to demonstrate improved functional strength and transfer efficiency. Baseline: 20.53 sec Goal status: INITIAL  3.  Pt will improve  Condition 4 on MCTSIB to 30 sec with moderate or less sway. Baseline:  Goal status: INITIAL  4.  distance to improve to 375 ft for improved gait efficiency and safety. Baseline:  Goal status: INITIAL  5.  FGI to be assessed and goal to be written as appropriate. Baseline:  Goal status: INITIAL  6.  Pt will perform floor>stand transfer with minimal to no UE external support, independently. Baseline:  Goal status: INITIAL  ASSESSMENT:  CLINICAL IMPRESSION: Patient is a 77 y.o. male who was seen today for physical therapy evaluation and treatment for gait instability.  He has had at least 6 months of medical issues including Covid x 2, A-fib, where pt has become more sedentary than typical.  He is a retired Optician, dispensing, who still performs Engineering geologist.   He is in active A-fib, on medication, and he is to have cardioversion on 07/31/2022.  He presents today to OPPT with decreased strength, decreased balance, abnormal gait, decreased endurance.  He will benefit from skilled PT to address the above stated deficits to decrease fall risk and to improve overall functional mobility and independence.  OBJECTIVE IMPAIRMENTS: Abnormal gait, decreased activity tolerance, decreased balance, decreased endurance, decreased mobility, difficulty walking, and decreased strength.   ACTIVITY LIMITATIONS: standing, squatting, stairs, transfers, and locomotion level  PARTICIPATION LIMITATIONS: community activity and occupation  PERSONAL FACTORS: 3+ comorbidities: see PMH  are also affecting patient's functional outcome.   REHAB POTENTIAL: Good  CLINICAL DECISION MAKING: Evolving/moderate complexity  EVALUATION COMPLEXITY: Moderate  PLAN:  PT FREQUENCY: 2x/week  PT DURATION: 8 weeks plus eval  PLANNED INTERVENTIONS: Therapeutic exercises, Therapeutic activity, Neuromuscular re-education, Balance training, Gait training, Patient/Family education, Self Care, and Stair training  PLAN  FOR NEXT SESSION: Initiate HEP for strength, balance; assess FGA and vitals.   Gean Maidens., PT 07/27/2022, 9:19 AM  [facility] at [facility] [address] [place], [address] [place], [place] [address] Phone # [phone] Fax # [phone]  Referring diagnosis?  R26.81 (ICD-10-CM) - Gait instability Treatment diagnosis? (if different than referring diagnosis) Unsteadiness on feet  Other abnormalities of gait and mobility  Muscle weakness (generalized) What was this (referring dx) caused by? []  Surgery []  Fall [x]  Ongoing issue []  Arthritis []  Other: ____________  Laterality: [x]  Rt [x]  Lt []  Both  Check all possible CPT codes:  *CHOOSE 10 OR LESS*    [x]  97110 (Therapeutic Exercise)  []  92507 (SLP Treatment)  [x]  97112 (Neuro Re-ed)   []  13086 (Swallowing Treatment)   [x]  97116 (Gait Training)   []  [id number] (Cognitive Training, 1st 15 minutes) []  97140 (Manual Therapy)   []  97130 (Cognitive Training, each add'l 15 minutes)  []  97164 (Re-evaluation)                              []  Other, List CPT Code ____________  [x]  57846 (Therapeutic Activities)     [x]   [phone] (Self Care)   []  All codes above (97110 - 97535)  []  97012 (Mechanical Traction)  []  97014 (E-stim Unattended)  []  97032 (E-stim manual)  []  97033 (Ionto)  []  97035 (Ultrasound) []  97750 (Physical Performance Training) []  [id number] (Aquatic Therapy) []  97016 (Vasopneumatic Device) []  [id number] (Paraffin) []  97034 (Contrast Bath) []  97597 (Wound Care 1st 20 sq cm) []  97598 (Wound Care each add'l 20 sq cm) []  97760 (Orthotic Fabrication, Fitting, Training Initial) []  60454 (Prosthetic Management and Training Initial) []  [id number] (Orthotic or Prosthetic Training/ Modification Subsequent)

## 2022-07-26 NOTE — Telephone Encounter (Signed)
Central Scheduling calling to speak with the nurse about an upcoming procedure on 7/30. Letting the nurses know the time changed from 8:30 am to 9:15 am. Please advise

## 2022-07-29 ENCOUNTER — Encounter (HOSPITAL_BASED_OUTPATIENT_CLINIC_OR_DEPARTMENT_OTHER): Payer: Self-pay

## 2022-07-29 NOTE — Telephone Encounter (Signed)
Addressed via separate encounter.   Alver Sorrow, NP

## 2022-07-30 ENCOUNTER — Ambulatory Visit: Payer: Medicare PPO | Admitting: Podiatry

## 2022-07-30 ENCOUNTER — Encounter: Payer: Self-pay | Admitting: Podiatry

## 2022-07-30 DIAGNOSIS — L600 Ingrowing nail: Secondary | ICD-10-CM

## 2022-07-30 DIAGNOSIS — D689 Coagulation defect, unspecified: Secondary | ICD-10-CM | POA: Diagnosis not present

## 2022-07-30 NOTE — Pre-Procedure Instructions (Signed)
Spoke to patient on phone regarding procedure tomorrow.  Instructed to arrive at 0800, NPO after midnight.  Confirmed patient has a ride home and responsible person to stay with patient for 24 hours after the procedure  Confirmed no missed doses of Eliquis, instructed patient to take the morning of surgery with a sip of water.  May take morning medication in the AM with a sip of water

## 2022-07-30 NOTE — Patient Instructions (Signed)

## 2022-07-30 NOTE — Anesthesia Preprocedure Evaluation (Signed)
Anesthesia Evaluation  Patient identified by MRN, date of birth, ID band Patient awake    Reviewed: Allergy & Precautions, NPO status , Patient's Chart, lab work & pertinent test results  History of Anesthesia Complications Negative for: history of anesthetic complications  Airway Mallampati: III  TM Distance: >3 FB Neck ROM: Full    Dental  (+) Dental Advisory Given   Pulmonary neg shortness of breath, sleep apnea and Continuous Positive Airway Pressure Ventilation , neg COPD, neg recent URI   Pulmonary exam normal breath sounds clear to auscultation       Cardiovascular hypertension, Pt. on medications (-) angina (-) Past MI, (-) Cardiac Stents, (-) CABG and (-) Orthopnea + dysrhythmias Atrial Fibrillation  Rhythm:Regular Rate:Normal  HLD  TTE 06/05/2022: IMPRESSIONS    1. Left ventricular ejection fraction, by estimation, is 60 to 65%. The  left ventricle has normal function.   2. Right ventricular systolic function is normal. The right ventricular  size is normal.   3. The mitral valve is normal in structure. No evidence of mitral valve  regurgitation.   4. Aortic valve regurgitation is not visualized.     Neuro/Psych  Headaches, neg Seizures PSYCHIATRIC DISORDERS  Depression       GI/Hepatic Neg liver ROS,neg GERD  ,,diverticulosis   Endo/Other  negative endocrine ROS    Renal/GU negative Renal ROS     Musculoskeletal   Abdominal   Peds  Hematology negative hematology ROS (+)   Anesthesia Other Findings Last Eliquis: this morning  Reproductive/Obstetrics                             Anesthesia Physical Anesthesia Plan  ASA: 3  Anesthesia Plan: General   Post-op Pain Management:    Induction: Intravenous  PONV Risk Score and Plan: 2 and Treatment may vary due to age or medical condition  Airway Management Planned: Mask  Additional Equipment:   Intra-op Plan:    Post-operative Plan: Extubation in OR  Informed Consent: I have reviewed the patients History and Physical, chart, labs and discussed the procedure including the risks, benefits and alternatives for the proposed anesthesia with the patient or authorized representative who has indicated his/her understanding and acceptance.     Dental advisory given  Plan Discussed with: CRNA and Anesthesiologist  Anesthesia Plan Comments: (Risks of general anesthesia discussed including, but not limited to, sore throat, hoarse voice, chipped/damaged teeth, injury to vocal cords, nausea and vomiting, allergic reactions, lung infection, heart attack, stroke, and death. All questions answered. )       Anesthesia Quick Evaluation

## 2022-07-31 ENCOUNTER — Ambulatory Visit (HOSPITAL_COMMUNITY)
Admission: RE | Admit: 2022-07-31 | Discharge: 2022-07-31 | Disposition: A | Discharge status: Home / Self Care | Payer: Medicare PPO | Attending: Cardiology | Admitting: Cardiology

## 2022-07-31 ENCOUNTER — Other Ambulatory Visit: Payer: Self-pay

## 2022-07-31 ENCOUNTER — Encounter (HOSPITAL_COMMUNITY)
Admission: RE | Disposition: A | Discharge status: Home / Self Care | Payer: Self-pay | Source: Home / Self Care | Attending: Cardiology

## 2022-07-31 ENCOUNTER — Ambulatory Visit (HOSPITAL_COMMUNITY): Payer: Self-pay | Admitting: Anesthesiology

## 2022-07-31 ENCOUNTER — Ambulatory Visit (HOSPITAL_BASED_OUTPATIENT_CLINIC_OR_DEPARTMENT_OTHER): Payer: Medicare PPO | Admitting: Anesthesiology

## 2022-07-31 DIAGNOSIS — Z79899 Other long term (current) drug therapy: Secondary | ICD-10-CM | POA: Insufficient documentation

## 2022-07-31 DIAGNOSIS — I10 Essential (primary) hypertension: Secondary | ICD-10-CM

## 2022-07-31 DIAGNOSIS — I480 Paroxysmal atrial fibrillation: Secondary | ICD-10-CM | POA: Insufficient documentation

## 2022-07-31 DIAGNOSIS — D6859 Other primary thrombophilia: Secondary | ICD-10-CM | POA: Insufficient documentation

## 2022-07-31 DIAGNOSIS — E785 Hyperlipidemia, unspecified: Secondary | ICD-10-CM | POA: Insufficient documentation

## 2022-07-31 DIAGNOSIS — Z87891 Personal history of nicotine dependence: Secondary | ICD-10-CM | POA: Diagnosis not present

## 2022-07-31 DIAGNOSIS — I700 Atherosclerosis of aorta: Secondary | ICD-10-CM | POA: Insufficient documentation

## 2022-07-31 DIAGNOSIS — I4891 Unspecified atrial fibrillation: Secondary | ICD-10-CM

## 2022-07-31 DIAGNOSIS — Z7901 Long term (current) use of anticoagulants: Secondary | ICD-10-CM | POA: Diagnosis not present

## 2022-07-31 DIAGNOSIS — R2689 Other abnormalities of gait and mobility: Secondary | ICD-10-CM | POA: Insufficient documentation

## 2022-07-31 DIAGNOSIS — G4733 Obstructive sleep apnea (adult) (pediatric): Secondary | ICD-10-CM | POA: Insufficient documentation

## 2022-07-31 DIAGNOSIS — I4819 Other persistent atrial fibrillation: Secondary | ICD-10-CM | POA: Diagnosis not present

## 2022-07-31 HISTORY — PX: CARDIOVERSION: SHX1299

## 2022-07-31 SURGERY — CARDIOVERSION
Anesthesia: General

## 2022-07-31 MED ORDER — LIDOCAINE 2% (20 MG/ML) 5 ML SYRINGE
INTRAMUSCULAR | Status: DC | PRN
  Administered 2022-07-31: 60 mg via INTRAVENOUS

## 2022-07-31 MED ORDER — PROPOFOL 10 MG/ML IV BOLUS
INTRAVENOUS | Status: DC | PRN
  Administered 2022-07-31: 80 mg via INTRAVENOUS

## 2022-07-31 SURGICAL SUPPLY — 1 items: ELECT DEFIB PAD ADLT CADENCE (PAD) ×1 IMPLANT

## 2022-07-31 NOTE — Transfer of Care (Signed)
Immediate Anesthesia Transfer of Care Note  Patient: Wesley [name]  Procedure(s) Performed: CARDIOVERSION  Patient Location: Cath Lab  Anesthesia Type:General  Level of Consciousness: awake, alert , drowsy, and patient cooperative  Airway & Oxygen Therapy: Patient Spontanous Breathing and Patient connected to nasal cannula oxygen  Post-op Assessment: Report given to RN and Post -op Vital signs reviewed and stable  Post vital signs: Reviewed and stable  Last Vitals:  Vitals Value Taken Time  BP    Temp    Pulse    Resp    SpO2      Last Pain:  Vitals:   07/31/22 0808  TempSrc: Temporal         Complications: No notable events documented.

## 2022-07-31 NOTE — Anesthesia Postprocedure Evaluation (Signed)
Anesthesia Post Note  Patient: Wesley [name]  Procedure(s) Performed: CARDIOVERSION     Patient location during evaluation: PACU Anesthesia Type: General Level of consciousness: awake Pain management: pain level controlled Vital Signs Assessment: post-procedure vital signs reviewed and stable Respiratory status: spontaneous breathing, nonlabored ventilation and respiratory function stable Cardiovascular status: blood pressure returned to baseline and stable Postop Assessment: no apparent nausea or vomiting Anesthetic complications: no   No notable events documented.  Last Vitals:  Vitals:   07/31/22 1020 07/31/22 1025  BP: 115/84 126/83  Pulse: 67 70  Resp: 11 12  Temp:    SpO2: 96% 96%    Last Pain:  Vitals:   07/31/22 1025  TempSrc:   PainSc: 0-No pain                 Linton Rump

## 2022-07-31 NOTE — CV Procedure (Signed)
   Electrical Cardioversion Procedure Note Wesley [name] [id number] 1945/05/23  Procedure: Electrical Cardioversion Indications:  Atrial Fibrillation  Time Out: Verified patient identification, verified procedure,medications/allergies/relevent history reviewed, required imaging and test results available.  Performed  Procedure Details  The patient signed informed consent.   The patient was NPO past midnight. Has had therapeutic anticoagulation with Eliquis greater than 3 weeks. The patient denies any interruption of anticoagulation.  Anesthesia was administered by Dr. Freida [name].  Adequate airway was maintained throughout and vital followed per protocol.  He was cardioverted x 1 with 200 J of biphasic synchronized energy.  He converted to NSR.  There were no apparent complications.  The patient tolerated the procedure well and had normal neuro status and respiratory status post procedure with vitals stable as recorded elsewhere.     IMPRESSION:  Successful cardioversion of atrial fibrillation   Follow up:  We will arrange follow up with primary cardiologist.  He will continue on current medical therapy.  The patient advised to continue anticoagulation with no interruption for 4 weeks, per CHADsVASc score need lifetime stroke prevention from atrial fibrillation.  Wesley [name] 07/31/2022, 10:01 AM

## 2022-07-31 NOTE — Interval H&P Note (Signed)
History and Physical Interval Note:  07/31/2022 8:26 AM  Wesley [name]  has presented today for surgery, with the diagnosis of AFIB.  The various methods of treatment have been discussed with the patient and family. After consideration of risks, benefits and other options for treatment, the patient has consented to  Procedure(s): CARDIOVERSION (N/A) as a surgical intervention.  The patient's history has been reviewed, patient examined, no change in status, stable for surgery.  I have reviewed the patient's chart and labs.  Questions were answered to the patient's satisfaction.     Darenda Fike

## 2022-08-01 ENCOUNTER — Encounter (HOSPITAL_COMMUNITY): Payer: Self-pay | Admitting: Cardiology

## 2022-08-01 NOTE — Progress Notes (Signed)
Subjective:   Patient ID: Wesley [name], male   DOB: 77 y.o.   MRN: [id number]   HPI Patient presents with a very painful ingrown toenail deformity right hallux lateral border that has made it hard to wear shoe gear.  Patient is on blood thinner and is due to have a cardioversion tomorrow and states it is very sore.  Patient does not smoke and tries to be active   Review of Systems  All other systems reviewed and are negative.       Objective:  Physical Exam Vitals and nursing note reviewed.  Constitutional:      Appearance: He is well-developed.  Pulmonary:     Effort: Pulmonary effort is normal.  Musculoskeletal:        General: Normal range of motion.  Skin:    General: Skin is warm.  Neurological:     Mental Status: He is alert.     Neurovascular status was found to be intact muscle strength found to be adequate range of motion adequate with patient noted to have incurvated lateral border right big toe painful when pressed making shoe gear difficult.  Patient does have good digital perfusion well oriented and does not seem to be excessively bruising with Eliquis usage     Assessment:  Ingrown toenail very painful right hallux lateral border     Plan:  H&P reviewed and we discussed different treatment options and patient wants it corrected due to the discomfort he is in.  I allowed patient to go over what will be necessary with the procedure and allowed him to read then signed consent form.  Patient wants surgery and is willing to accept the risk of this and I went ahead today and I infiltrated the right big toe 60 mg like Marcaine mixture sterile prep done and using sterile instrumentation I removed the lateral border I exposed matrix I applied phenol 3 applications 30 seconds followed by alcohol lavage sterile dressing gave instructions on soaks and to wear dressing 24 hours take it off earlier if throbbing were to occur.  Encouraged to call questions concerns which may  arise

## 2022-08-06 NOTE — Telephone Encounter (Signed)
Can send Rx for Metoprolol tartrate 25mg  BID to beach if he is still on vacation. This will help to lower heart rate, decrease symptoms from afib.  Can schedule for sooner follow up with [place] or with AFib clinic to discuss next steps, repeat EKG, etc.   Alver Sorrow, NP

## 2022-08-06 NOTE — Telephone Encounter (Signed)
Please advise 

## 2022-08-08 ENCOUNTER — Encounter: Payer: Self-pay | Admitting: Physical Therapy

## 2022-08-08 ENCOUNTER — Ambulatory Visit: Payer: Medicare PPO | Attending: Family | Admitting: Physical Therapy

## 2022-08-08 DIAGNOSIS — R2689 Other abnormalities of gait and mobility: Secondary | ICD-10-CM | POA: Insufficient documentation

## 2022-08-08 DIAGNOSIS — R2681 Unsteadiness on feet: Secondary | ICD-10-CM | POA: Diagnosis not present

## 2022-08-08 DIAGNOSIS — R262 Difficulty in walking, not elsewhere classified: Secondary | ICD-10-CM | POA: Diagnosis not present

## 2022-08-08 DIAGNOSIS — M6281 Muscle weakness (generalized): Secondary | ICD-10-CM | POA: Diagnosis not present

## 2022-08-08 NOTE — Therapy (Signed)
OUTPATIENT PHYSICAL THERAPY NEURO TREATMENT NOTE   Patient Name: Wesley [name] MRN: [id number] DOB:1945/05/15, 77 y.o., male Today's Date: 08/08/2022   PCP:   Shon Hale, MD   REFERRING PROVIDER: Alver Sorrow, NP   END OF SESSION:  PT End of Session - 08/08/22 0935     Visit Number 2    Number of Visits 17    Date for PT Re-Evaluation 09/21/22    Authorization Type [employer]    Authorization Time Period 07/26/2022-09/21/2022    Authorization - Visit Number 1    Authorization - Number of Visits 17    Progress Note Due on Visit 10    PT Start Time 0935    PT Stop Time 1013    PT Time Calculation (min) 38 min    Activity Tolerance Patient tolerated treatment well    Behavior During Therapy WFL for tasks assessed/performed              Past Medical History:  Diagnosis Date   Depression    Diverticulosis    Hyperlipidemia    Hypertension    Migraine    OSA on CPAP    mild obstructive sleep apnea with an AHI of 8 and O2 saturations as low as 85%.  On CPAP 10cm H2O   Past Surgical History:  Procedure Laterality Date   CARDIOVERSION N/A 05/24/2021   Procedure: CARDIOVERSION;  Surgeon: Little Ishikawa, MD;  Location: [facility]COPY;  Service: Cardiovascular;  Laterality: N/A;   CARDIOVERSION N/A 07/31/2022   Procedure: CARDIOVERSION;  Surgeon: Thomasene Ripple, DO;  Location: [facility] INVASIVE CV LAB;  Service: Cardiovascular;  Laterality: N/A;   CYSTOSCOPY WITH INSERTION OF UROLIFT N/A 04/28/2019   Procedure: CYSTOSCOPY WITH INSERTION OF UROLIFT;  Surgeon: Jerilee Field, MD;  Location: [facility];  Service: Urology;  Laterality: N/A;   GANGLION CYST EXCISION     PROSTATE BIOPSY N/A 04/28/2019   Procedure: BIOPSY TRANSRECTAL ULTRASONIC PROSTATE (TUBP);  Surgeon: Jerilee Field, MD;  [facility];  Service: Urology;  Laterality: N/A;   Patient Active Problem List   Diagnosis Date Noted   OSA on CPAP  01/03/2022   Persistent atrial fibrillation (HCC)    Diverticulitis of colon with perforation 12/18/2020   Bruxism 05/07/2017   Laryngopharyngeal reflux (LPR) 05/07/2017   Presbycusis of both ears 05/07/2017   Hemorrhoids, external, thrombosed 02/13/2012    ONSET DATE: 07/23/2022 (MD referral)  REFERRING DIAG: R26.81 (ICD-10-CM) - Gait instability   THERAPY DIAG:  Unsteadiness on feet  Other abnormalities of gait and mobility  Muscle weakness (generalized)  Rationale for Evaluation and Treatment: Rehabilitation  SUBJECTIVE:  SUBJECTIVE STATEMENT: Just got back from the beach.  Had the cardioversion and it failed within a week.  Will likely have to do the ablation. (To have MD appt with cardiologist 9/6).  Energy level is generally lower. Pt accompanied by: self  PERTINENT HISTORY: A-fib, scheduled for cardioversion  PAIN:  Are you having pain? No  PRECAUTIONS: Fall and Other: a-fib, cardioversion to be performed 07/31/2022  RED FLAGS: None   WEIGHT BEARING RESTRICTIONS: No  FALLS: Has patient fallen in last 6 months? Yes. Number of falls 2 in past year Usually when I first get up and don't have my balance. Have difficulty going down the stairs.  LIVING ENVIRONMENT: Lives with: lives with their spouse Lives in: House/apartment Stairs:  has second floor, but can live on first level.  Has 8 steps with L handrail Has following equipment at home: None  PLOF: Independent  Enjoys walking in neighborhood, travel.  Retired Optician, dispensing  PATIENT GOALS: To regain balance, strength  OBJECTIVE:   TODAY'S TREATMENT: 08/08/2022 Activity Comments  FGA 16/30 Scores <22/30 indicates increased fall risk  Vitals:  96% O2; HR 85-94 bpm BP  134/89   NMR: Standing balance at counter: -Romberg EO  head  turns x 10, head nods x 10, 2 sets -Romberg EC 30 sec, 2 sets  Light UE support  Forward/back walking at counter x 3 reps UE support  Tandem gait, 3 reps Cues to look ahead at visual target       [facility] PT Assessment - 08/08/22 0001       Functional Gait  Assessment   Gait assessed  Yes    Gait Level Surface Walks 20 ft, slow speed, abnormal gait pattern, evidence for imbalance or deviates 10-15 in outside of the 12 in walkway width. Requires more than 7 sec to ambulate 20 ft.   7.4 sec   Change in Gait Speed Able to smoothly change walking speed without loss of balance or gait deviation. Deviate no more than 6 in outside of the 12 in walkway width.    Gait with Horizontal Head Turns Performs head turns with moderate changes in gait velocity, slows down, deviates 10-15 in outside 12 in walkway width but recovers, can continue to walk.   11 sec   Gait with Vertical Head Turns Performs task with slight change in gait velocity (eg, minor disruption to smooth gait path), deviates 6 - 10 in outside 12 in walkway width or uses assistive device   9.65 sec   Gait and Pivot Turn Pivot turns safely within 3 sec and stops quickly with no loss of balance.    Step Over Obstacle Cannot perform without assistance.    Gait with Narrow Base of Support Ambulates 7-9 steps.    Gait with Eyes Closed Walks 20 ft, slow speed, abnormal gait pattern, evidence for imbalance, deviates 10-15 in outside 12 in walkway width. Requires more than 9 sec to ambulate 20 ft.   15 sec   Ambulating Backwards Walks 20 ft, slow speed, abnormal gait pattern, evidence for imbalance, deviates 10-15 in outside 12 in walkway width.   20 sec   Steps Alternating feet, must use rail.    Total Score 16    FGA comment: scores <22/30 indicate increased fall risk             PATIENT EDUCATION: Education details: HEP-see below Person educated: Patient Education method: Explanation, Demonstration, and Handouts Education comprehension:  verbalized understanding, returned demonstration, and needs further education  Access Code: [email or web address] URL: [email or web address] Date: 08/08/2022 Prepared by: [facility] - Outpatient  Rehab - [facility]  Exercises - Romberg Stance with Head Rotation  - 1 x daily - 7 x weekly - 1-2 sets - 10 reps - Romberg Stance with Head Nods  - 1 x daily - 7 x weekly - 1-2 sets - 10 reps - Romberg Stance with Eyes Closed  - 1 x daily - 7 x weekly - 1 sets - 3 reps - 30 sec hold - Backward Walking with Counter Support  - 1 x daily - 7 x weekly - 1 sets - 3-5 reps - Tandem Walking with Counter Support  - 1 x daily - 7 x weekly - 1 sets - 3-5 reps  ---------------------------------------------------------------------- Objective measure below taken at time of initial eval:  DIAGNOSTIC FINDINGS: NA for this episode  COGNITION: Overall cognitive status: Within functional limits for tasks assessed   SENSATION: WFL  POSTURE: rounded shoulders and forward head  LOWER EXTREMITY ROM:   A/ROM BLEs  LOWER EXTREMITY MMT:    MMT Right Eval Left Eval  Hip flexion 4 4  Hip extension    Hip abduction    Hip adduction    Hip internal rotation    Hip external rotation    Knee flexion 4+ 4+  Knee extension 4+ 4+  Ankle dorsiflexion 4 4  Ankle plantarflexion    Ankle inversion    Ankle eversion    (Blank rows = not tested)    TRANSFERS: Assistive device utilized: None  Sit to stand: Modified independence Stand to sit: Modified independence *Reports needing UE support to push up to get up from floor  GAIT: Gait pattern: step through pattern and wide BOS Distance walked: 320 ft Assistive device utilized: None Level of assistance: Modified independence  FUNCTIONAL TESTS:   Vitals:  133/88, HR 102 5 times sit to stand: 20.53 sec 55M walk:  12.3 sec (2.67 ft/sec) M-CTSIB  Condition 1: Firm Surface, EO 30 Sec, Mild Sway  Condition 2: Firm Surface, EC 30 Sec, Moderate Sway   Condition 3: Foam Surface, EO 30 Sec, Moderate Sway  Condition 4: Foam Surface, EC 6.6 Sec, Severe Sway      TODAY'S TREATMENT:                                                                                                                              DATE: 07/26/2022    PATIENT EDUCATION: Education details: Eval results, POC Person educated: Patient Education method: Explanation Education comprehension: verbalized understanding  HOME EXERCISE PROGRAM: Not yet initiated  GOALS: Goals reviewed with patient? Yes  SHORT TERM GOALS: Target date: 08/24/2022  Pt will be independent with HEP for improved strength, balance, gait. Baseline: Goal status: IN PROGRESS  2.  Pt will improve 5x sit<>stand to less than or equal to 15 sec to demonstrate improved functional strength and transfer efficiency. Baseline: 20.53 sec Goal status: IN PROGRESS  LONG TERM GOALS: Target date: 09/21/2022  Pt will be independent with HEP for improved strength, balance, gait. Baseline:  Goal status: IN PROGRESS  2.  Pt will improve 5x sit<>stand to less than or equal to 12.5 sec to demonstrate improved functional strength and transfer efficiency. Baseline: 20.53 sec Goal status:IN PROGRESS  3.  Pt will improve Condition 4 on MCTSIB to 30 sec with moderate or less sway. Baseline:  Goal status: IN PROGRESS  4.  distance to improve to 375 ft for improved gait efficiency and safety. Baseline:  Goal status: IN PROGRESS  5.  FGA score to improve to 22/30 for improved balance, decreased fall risk. Baseline: 16/30 Goal status: IN PROGRESS  6.  Pt will perform floor>stand transfer with minimal to no UE external support, independently. Baseline:  Goal status: IN PROGRESS  ASSESSMENT:  CLINICAL IMPRESSION: Skilled PT session focused on completing FGA (score 16/30, indicating increased fall risk) and initiating HEP.  Pt  unsteady with head motions, backwards walking, tandem gait and obstacle  negotiation on FGA.  Performed Romberg stance balance activities and backwards walking, tandem gait at counter; initiated HEP to address these items.  *Of note, pt's cardioversion failed, and he continues to be in A-Fib.  Vitals taken in session today were within normal limits; he took brief rest breaks today and did not have complaints.   OBJECTIVE IMPAIRMENTS: Abnormal gait, decreased activity tolerance, decreased balance, decreased endurance, decreased mobility, difficulty walking, and decreased strength.   ACTIVITY LIMITATIONS: standing, squatting, stairs, transfers, and locomotion level  PARTICIPATION LIMITATIONS: community activity and occupation  PERSONAL FACTORS: 3+ comorbidities: see PMH  are also affecting patient's functional outcome.   REHAB POTENTIAL: Good  CLINICAL DECISION MAKING: Evolving/moderate complexity  EVALUATION COMPLEXITY: Moderate  PLAN:  PT FREQUENCY: 2x/week  PT DURATION: 8 weeks plus eval  PLANNED INTERVENTIONS: Therapeutic exercises, Therapeutic activity, Neuromuscular re-education, Balance training, Gait training, Patient/Family education, Self Care, and Stair training  PLAN FOR NEXT SESSION: Review and progress HEP for strength, balance; assess  vitals.   Gean Maidens., PT 08/08/2022, 10:19 AM  [facility] Outpatient Rehab at [facility] [address] [place], [address] [place], [place] [address] Phone # [phone] Fax # [phone]

## 2022-08-10 ENCOUNTER — Ambulatory Visit: Payer: Medicare PPO

## 2022-08-10 DIAGNOSIS — R2689 Other abnormalities of gait and mobility: Secondary | ICD-10-CM

## 2022-08-10 DIAGNOSIS — R262 Difficulty in walking, not elsewhere classified: Secondary | ICD-10-CM

## 2022-08-10 DIAGNOSIS — R2681 Unsteadiness on feet: Secondary | ICD-10-CM | POA: Diagnosis not present

## 2022-08-10 DIAGNOSIS — M6281 Muscle weakness (generalized): Secondary | ICD-10-CM | POA: Diagnosis not present

## 2022-08-10 NOTE — Therapy (Signed)
OUTPATIENT PHYSICAL THERAPY NEURO TREATMENT NOTE   Patient Name: Wesley [name] MRN: [id number] DOB:February 02, 1945, 77 y.o., male Today's Date: 08/10/2022   PCP:   Shon Hale, MD   REFERRING PROVIDER: Alver Sorrow, NP   END OF SESSION:  PT End of Session - 08/10/22 0756     Visit Number 3    Number of Visits 17    Date for PT Re-Evaluation 09/21/22    Authorization Type [employer]    Authorization Time Period 07/26/2022-09/21/2022    Authorization - Visit Number 2    Authorization - Number of Visits 17    Progress Note Due on Visit 10    PT Start Time 0800    PT Stop Time 0845    PT Time Calculation (min) 45 min    Activity Tolerance Patient tolerated treatment well    Behavior During Therapy WFL for tasks assessed/performed              Past Medical History:  Diagnosis Date   Depression    Diverticulosis    Hyperlipidemia    Hypertension    Migraine    OSA on CPAP    mild obstructive sleep apnea with an AHI of 8 and O2 saturations as low as 85%.  On CPAP 10cm H2O   Past Surgical History:  Procedure Laterality Date   CARDIOVERSION N/A 05/24/2021   Procedure: CARDIOVERSION;  Surgeon: Little Ishikawa, MD;  Location: [facility] ENDOSCOPY;  Service: Cardiovascular;  Laterality: N/A;   CARDIOVERSION N/A 07/31/2022   Procedure: CARDIOVERSION;  Surgeon: Thomasene Ripple, DO;  Location: [facility] INVASIVE CV LAB;  Service: Cardiovascular;  Laterality: N/A;   CYSTOSCOPY WITH INSERTION OF UROLIFT N/A 04/28/2019   Procedure: CYSTOSCOPY WITH INSERTION OF UROLIFT;  Surgeon: Jerilee Field, MD;  Location: [facility];  Service: Urology;  Laterality: N/A;   GANGLION CYST EXCISION     PROSTATE BIOPSY N/A 04/28/2019   Procedure: BIOPSY TRANSRECTAL ULTRASONIC PROSTATE (TUBP);  Surgeon: Jerilee Field, MD;  Location: [facility];  Service: Urology;  Laterality: N/A;   Patient Active Problem List   Diagnosis Date Noted   OSA on CPAP  01/03/2022   Persistent atrial fibrillation (HCC)    Diverticulitis of colon with perforation 12/18/2020   Bruxism 05/07/2017   Laryngopharyngeal reflux (LPR) 05/07/2017   Presbycusis of both ears 05/07/2017   Hemorrhoids, external, thrombosed 02/13/2012    ONSET DATE: 07/23/2022 (MD referral)  REFERRING DIAG: R26.81 (ICD-10-CM) - Gait instability   THERAPY DIAG:  Unsteadiness on feet  Other abnormalities of gait and mobility  Muscle weakness (generalized)  Difficulty in walking, not elsewhere classified  Rationale for Evaluation and Treatment: Rehabilitation  SUBJECTIVE:  SUBJECTIVE STATEMENT: Doing ok, no new issues since last visit. The backwards walking is difficult. Walking at [employer] Pt accompanied by: self  PERTINENT HISTORY: A-fib, scheduled for cardioversion  PAIN:  Are you having pain? No  PRECAUTIONS: Fall and Other: a-fib, cardioversion to be performed 07/31/2022  RED FLAGS: None   WEIGHT BEARING RESTRICTIONS: No  FALLS: Has patient fallen in last 6 months? Yes. Number of falls 2 in past year Usually when I first get up and don't have my balance. Have difficulty going down the stairs.  LIVING ENVIRONMENT: Lives with: lives with their spouse Lives in: House/apartment Stairs:  has second floor, but can live on first level.  Has 8 steps with L handrail Has following equipment at home: None  PLOF: Independent  Enjoys walking in neighborhood, travel.  Retired Optician, dispensing  PATIENT GOALS: To regain balance, strength  OBJECTIVE:   TODAY'S TREATMENT: 08/10/22 Activity Comments  Vitals: 97%, 130/102 mmHg, 94 bpm   Retro-walking x 2 min Along counter  Tandem gait x 2 min Along counter  Balance activities: in corner -feet together EO/EC + head turns -tandem stance EO/EC  Sit  to stand + ankle PF 2x10   Standing balance On foam: EO/EC x 30 sec wide/narrow BOS.  -cone taps 1x10 -one on pad, one on dynadisc  Gastroc stretch 2x60 sec      TODAY'S TREATMENT: 08/08/2022 Activity Comments  FGA 16/30 Scores <22/30 indicates increased fall risk  Vitals:  96% O2; HR 85-94 bpm BP  134/89   NMR: Standing balance at counter: -Romberg EO  head turns x 10, head nods x 10, 2 sets -Romberg EC 30 sec, 2 sets  Light UE support  Forward/back walking at counter x 3 reps UE support  Tandem gait, 3 reps Cues to look ahead at visual target         PATIENT EDUCATION: Education details: HEP-see below Person educated: Patient Education method: Explanation, Demonstration, and Handouts Education comprehension: verbalized understanding, returned demonstration, and needs further education  Access Code: [email or web address] URL: [email or web address] Date: 08/08/2022 Prepared by: [facility] - Outpatient  Rehab - [facility]  Exercises - Romberg Stance with Head Rotation  - 1 x daily - 7 x weekly - 1-2 sets - 10 reps - Romberg Stance with Head Nods  - 1 x daily - 7 x weekly - 1-2 sets - 10 reps - Romberg Stance with Eyes Closed  - 1 x daily - 7 x weekly - 1 sets - 3 reps - 30 sec hold - Backward Walking with Counter Support  - 1 x daily - 7 x weekly - 1 sets - 3-5 reps - Tandem Walking with Counter Support  - 1 x daily - 7 x weekly - 1 sets - 3-5 reps - Sit to Stand with Arm Reach and Jump (sit to stand then ankle PF)  - 1 x daily - 7 x weekly - 3 sets - 10 reps ---------------------------------------------------------------------- Objective measure below taken at time of initial eval:  DIAGNOSTIC FINDINGS: NA for this episode  COGNITION: Overall cognitive status: Within functional limits for tasks assessed   SENSATION: WFL  POSTURE: rounded shoulders and forward head  LOWER EXTREMITY ROM:   A/ROM BLEs  LOWER EXTREMITY MMT:    MMT Right Eval Left Eval   Hip flexion 4 4  Hip extension    Hip abduction    Hip adduction    Hip internal rotation    Hip external rotation    Knee flexion 4+  4+  Knee extension 4+ 4+  Ankle dorsiflexion 4 4  Ankle plantarflexion    Ankle inversion    Ankle eversion    (Blank rows = not tested)    TRANSFERS: Assistive device utilized: None  Sit to stand: Modified independence Stand to sit: Modified independence *Reports needing UE support to push up to get up from floor  GAIT: Gait pattern: step through pattern and wide BOS Distance walked: 320 ft Assistive device utilized: None Level of assistance: Modified independence  FUNCTIONAL TESTS:   Vitals:  133/88, HR 102 5 times sit to stand: 20.53 sec 79M walk:  12.3 sec (2.67 ft/sec) M-CTSIB  Condition 1: Firm Surface, EO 30 Sec, Mild Sway  Condition 2: Firm Surface, EC 30 Sec, Moderate Sway  Condition 3: Foam Surface, EO 30 Sec, Moderate Sway  Condition 4: Foam Surface, EC 6.6 Sec, Severe Sway      TODAY'S TREATMENT:                                                                                                                              DATE: 07/26/2022    PATIENT EDUCATION: Education details: Eval results, POC Person educated: Patient Education method: Explanation Education comprehension: verbalized understanding  HOME EXERCISE PROGRAM: Not yet initiated  GOALS: Goals reviewed with patient? Yes  SHORT TERM GOALS: Target date: 08/24/2022  Pt will be independent with HEP for improved strength, balance, gait. Baseline: Goal status: IN PROGRESS  2.  Pt will improve 5x sit<>stand to less than or equal to 15 sec to demonstrate improved functional strength and transfer efficiency. Baseline: 20.53 sec Goal status: IN PROGRESS   LONG TERM GOALS: Target date: 09/21/2022  Pt will be independent with HEP for improved strength, balance, gait. Baseline:  Goal status: IN PROGRESS  2.  Pt will improve 5x sit<>stand to less than or  equal to 12.5 sec to demonstrate improved functional strength and transfer efficiency. Baseline: 20.53 sec Goal status:IN PROGRESS  3.  Pt will improve Condition 4 on MCTSIB to 30 sec with moderate or less sway. Baseline:  Goal status: IN PROGRESS  4.  distance to improve to 375 ft for improved gait efficiency and safety. Baseline:  Goal status: IN PROGRESS  5.  FGA score to improve to 22/30 for improved balance, decreased fall risk. Baseline: 16/30 Goal status: IN PROGRESS  6.  Pt will perform floor>stand transfer with minimal to no UE external support, independently. Baseline:  Goal status: IN PROGRESS  ASSESSMENT:  CLINICAL IMPRESSION: Review of HEP with good return demonstration. Addition of multi-sensory balance activities to improve proprioceptive awareness. LE strengthening via sit-stand with ankle PF w/ need for UE support due to ankle weakness/unsteadiness. Continued sessions to advance POC details    OBJECTIVE IMPAIRMENTS: Abnormal gait, decreased activity tolerance, decreased balance, decreased endurance, decreased mobility, difficulty walking, and decreased strength.   ACTIVITY LIMITATIONS: standing, squatting, stairs, transfers, and locomotion level  PARTICIPATION LIMITATIONS: community activity  and occupation  PERSONAL FACTORS: 3+ comorbidities: see PMH  are also affecting patient's functional outcome.   REHAB POTENTIAL: Good  CLINICAL DECISION MAKING: Evolving/moderate complexity  EVALUATION COMPLEXITY: Moderate  PLAN:  PT FREQUENCY: 2x/week  PT DURATION: 8 weeks plus eval  PLANNED INTERVENTIONS: Therapeutic exercises, Therapeutic activity, Neuromuscular re-education, Balance training, Gait training, Patient/Family education, Self Care, and Stair training  PLAN FOR NEXT SESSION: Review and progress HEP for strength, balance; assess  vitals.   Dion Body, PT 08/10/2022, 7:56 AM  [facility] Health Outpatient Rehab at [facility] [address] [place], [address] [place], [place] [address] Phone # [phone] Fax # [phone]

## 2022-08-13 ENCOUNTER — Ambulatory Visit: Payer: Medicare PPO

## 2022-08-13 DIAGNOSIS — R2689 Other abnormalities of gait and mobility: Secondary | ICD-10-CM

## 2022-08-13 DIAGNOSIS — R262 Difficulty in walking, not elsewhere classified: Secondary | ICD-10-CM | POA: Diagnosis not present

## 2022-08-13 DIAGNOSIS — I10 Essential (primary) hypertension: Secondary | ICD-10-CM | POA: Diagnosis not present

## 2022-08-13 DIAGNOSIS — M6281 Muscle weakness (generalized): Secondary | ICD-10-CM

## 2022-08-13 DIAGNOSIS — R2681 Unsteadiness on feet: Secondary | ICD-10-CM

## 2022-08-13 DIAGNOSIS — G4733 Obstructive sleep apnea (adult) (pediatric): Secondary | ICD-10-CM | POA: Diagnosis not present

## 2022-08-13 DIAGNOSIS — R0683 Snoring: Secondary | ICD-10-CM | POA: Diagnosis not present

## 2022-08-13 NOTE — Therapy (Signed)
OUTPATIENT PHYSICAL THERAPY NEURO TREATMENT NOTE   Patient Name: Wesley [name] MRN: [id number] DOB:[birth year], 77 y.o., male Today's Date: 08/13/2022   PCP:   Shon Hale, MD   REFERRING PROVIDER: Alver Sorrow, NP   END OF SESSION:  PT End of Session - 08/13/22 0801     Visit Number 4    Number of Visits 17    Date for PT Re-Evaluation 09/21/22    Authorization Type [employer]    Authorization Time Period 07/26/2022-09/21/2022    Authorization - Visit Number 3    Authorization - Number of Visits 17    Progress Note Due on Visit 10    PT Start Time 0800    PT Stop Time 0845    PT Time Calculation (min) 45 min    Activity Tolerance Patient tolerated treatment well    Behavior During Therapy WFL for tasks assessed/performed              Past Medical History:  Diagnosis Date   Depression    Diverticulosis    Hyperlipidemia    Hypertension    Migraine    OSA on CPAP    mild obstructive sleep apnea with an AHI of 8 and O2 saturations as low as 85%.  On CPAP 10cm H2O   Past Surgical History:  Procedure Laterality Date   CARDIOVERSION N/A 05/24/2021   Procedure: CARDIOVERSION;  Surgeon: Little Ishikawa, MD;  Location: [facility] ENDOSCOPY;  Service: Cardiovascular;  Laterality: N/A;   CARDIOVERSION N/A 07/31/2022   Procedure: CARDIOVERSION;  Surgeon: Thomasene Ripple, DO;  Location: [facility] INVASIVE CV LAB;  Service: Cardiovascular;  Laterality: N/A;   CYSTOSCOPY WITH INSERTION OF UROLIFT N/A 04/28/2019   Procedure: CYSTOSCOPY WITH INSERTION OF UROLIFT;  Surgeon: Jerilee Field, MD;  Location: [facility];  Service: Urology;  Laterality: N/A;   GANGLION CYST EXCISION     PROSTATE BIOPSY N/A 04/28/2019   Procedure: BIOPSY TRANSRECTAL ULTRASONIC PROSTATE (TUBP);  Surgeon: Jerilee Field, MD;  Location: [facility];  Service: Urology;  Laterality: N/A;   Patient Active Problem List   Diagnosis Date Noted   OSA on CPAP  01/03/2022   Persistent atrial fibrillation (HCC)    Diverticulitis of colon with perforation 12/18/2020   Bruxism 05/07/2017   Laryngopharyngeal reflux (LPR) 05/07/2017   Presbycusis of both ears 05/07/2017   Hemorrhoids, external, thrombosed 02/13/2012    ONSET DATE: 07/23/2022 (MD referral)  REFERRING DIAG: R26.81 (ICD-10-CM) - Gait instability   THERAPY DIAG:  Unsteadiness on feet  Other abnormalities of gait and mobility  Muscle weakness (generalized)  Difficulty in walking, not elsewhere classified  Balance problem  Rationale for Evaluation and Treatment: Rehabilitation  SUBJECTIVE:  SUBJECTIVE STATEMENT: Not doing to well today. Have an eye condition starting up and BP is elevated Pt accompanied by: self  PERTINENT HISTORY: A-fib, scheduled for cardioversion  PAIN:  Are you having pain? No  PRECAUTIONS: Fall and Other: a-fib, cardioversion to be performed 07/31/2022  RED FLAGS: None   WEIGHT BEARING RESTRICTIONS: No  FALLS: Has patient fallen in last 6 months? Yes. Number of falls 2 in past year Usually when I first get up and don't have my balance. Have difficulty going down the stairs.  LIVING ENVIRONMENT: Lives with: lives with their spouse Lives in: House/apartment Stairs:  has second floor, but can live on first level.  Has 8 steps with L handrail Has following equipment at home: None  PLOF: Independent  Enjoys walking in neighborhood, travel.  Retired Optician, dispensing  PATIENT GOALS: To regain balance, strength  OBJECTIVE:   TODAY'S TREATMENT: 08/13/22 Activity Comments  Vitals: 141/88 mmHg, 83 bpm   NU-step level 5 x 10 min For gentle CV endurance  Vitals post-aerobic: 134/91 mmHg, 91 bpm   Dynamic balance activities -emphasis on single limb support and coupled with  head movements to challenge equilibrium  Vitals post-session: 144/101 mmHg, 89 bpm         TODAY'S TREATMENT: 08/10/22 Activity Comments  Vitals: 97%, 130/102 mmHg, 94 bpm   Retro-walking x 2 min Along counter  Tandem gait x 2 min Along counter  Balance activities: in corner -feet together EO/EC + head turns -tandem stance EO/EC  Sit to stand + ankle PF 2x10   Standing balance On foam: EO/EC x 30 sec wide/narrow BOS.  -cone taps 1x10 -one on pad, one on dynadisc  Gastroc stretch 2x60 sec          PATIENT EDUCATION: Education details: HEP-see below Person educated: Patient Education method: Explanation, Demonstration, and Handouts Education comprehension: verbalized understanding, returned demonstration, and needs further education  Access Code: [email or web address] URL: [email or web address] Date: 08/08/2022 Prepared by: [facility] - Outpatient  Rehab - [facility]  Exercises - Romberg Stance with Head Rotation  - 1 x daily - 7 x weekly - 1-2 sets - 10 reps - Romberg Stance with Head Nods  - 1 x daily - 7 x weekly - 1-2 sets - 10 reps - Romberg Stance with Eyes Closed  - 1 x daily - 7 x weekly - 1 sets - 3 reps - 30 sec hold - Backward Walking with Counter Support  - 1 x daily - 7 x weekly - 1 sets - 3-5 reps - Tandem Walking with Counter Support  - 1 x daily - 7 x weekly - 1 sets - 3-5 reps - Sit to Stand with Arm Reach and Jump (sit to stand then ankle PF)  - 1 x daily - 7 x weekly - 3 sets - 10 reps ---------------------------------------------------------------------- Objective measure below taken at time of initial eval:  DIAGNOSTIC FINDINGS: NA for this episode  COGNITION: Overall cognitive status: Within functional limits for tasks assessed   SENSATION: WFL  POSTURE: rounded shoulders and forward head  LOWER EXTREMITY ROM:   A/ROM BLEs  LOWER EXTREMITY MMT:    MMT Right Eval Left Eval  Hip flexion 4 4  Hip extension    Hip abduction    Hip  adduction    Hip internal rotation    Hip external rotation    Knee flexion 4+ 4+  Knee extension 4+ 4+  Ankle dorsiflexion 4 4  Ankle plantarflexion    Ankle inversion  Ankle eversion    (Blank rows = not tested)    TRANSFERS: Assistive device utilized: None  Sit to stand: Modified independence Stand to sit: Modified independence *Reports needing UE support to push up to get up from floor  GAIT: Gait pattern: step through pattern and wide BOS Distance walked: 320 ft Assistive device utilized: None Level of assistance: Modified independence  FUNCTIONAL TESTS:   Vitals:  133/88, HR 102 5 times sit to stand: 20.53 sec 2M walk:  12.3 sec (2.67 ft/sec) M-CTSIB  Condition 1: Firm Surface, EO 30 Sec, Mild Sway  Condition 2: Firm Surface, EC 30 Sec, Moderate Sway  Condition 3: Foam Surface, EO 30 Sec, Moderate Sway  Condition 4: Foam Surface, EC 6.6 Sec, Severe Sway      TODAY'S TREATMENT:                                                                                                                              DATE: 07/26/2022    PATIENT EDUCATION: Education details: Eval results, POC Person educated: Patient Education method: Explanation Education comprehension: verbalized understanding  HOME EXERCISE PROGRAM: Not yet initiated  GOALS: Goals reviewed with patient? Yes  SHORT TERM GOALS: Target date: 08/24/2022  Pt will be independent with HEP for improved strength, balance, gait. Baseline: Goal status: IN PROGRESS  2.  Pt will improve 5x sit<>stand to less than or equal to 15 sec to demonstrate improved functional strength and transfer efficiency. Baseline: 20.53 sec Goal status: IN PROGRESS   LONG TERM GOALS: Target date: 09/21/2022  Pt will be independent with HEP for improved strength, balance, gait. Baseline:  Goal status: IN PROGRESS  2.  Pt will improve 5x sit<>stand to less than or equal to 12.5 sec to demonstrate improved functional strength  and transfer efficiency. Baseline: 20.53 sec Goal status:IN PROGRESS  3.  Pt will improve Condition 4 on MCTSIB to 30 sec with moderate or less sway. Baseline:  Goal status: IN PROGRESS  4.  distance to improve to 375 ft for improved gait efficiency and safety. Baseline:  Goal status: IN PROGRESS  5.  FGA score to improve to 22/30 for improved balance, decreased fall risk. Baseline: 16/30 Goal status: IN PROGRESS  6.  Pt will perform floor>stand transfer with minimal to no UE external support, independently. Baseline:  Goal status: IN PROGRESS  ASSESSMENT:  CLINICAL IMPRESSION: Arrives to session and reports his BP has been rather elevated this AM.  Vitals checked throughout session without adverse response although remaining elevated. Denies HA, blurred vision, or other red flag signs.  Engaged in light activity to accommodate with initial use of light effort cardiovascular exercise followed by dynamic balance to facilitate righting reactions and single limb support.  Continued sessions indicated to progress balance and strength activities to reduce risk for falls  OBJECTIVE IMPAIRMENTS: Abnormal gait, decreased activity tolerance, decreased balance, decreased endurance, decreased mobility, difficulty walking, and decreased strength.   ACTIVITY  LIMITATIONS: standing, squatting, stairs, transfers, and locomotion level  PARTICIPATION LIMITATIONS: community activity and occupation  PERSONAL FACTORS: 3+ comorbidities: see PMH  are also affecting patient's functional outcome.   REHAB POTENTIAL: Good  CLINICAL DECISION MAKING: Evolving/moderate complexity  EVALUATION COMPLEXITY: Moderate  PLAN:  PT FREQUENCY: 2x/week  PT DURATION: 8 weeks plus eval  PLANNED INTERVENTIONS: Therapeutic exercises, Therapeutic activity, Neuromuscular re-education, Balance training, Gait training, Patient/Family education, Self Care, and Stair training  PLAN FOR NEXT SESSION: Review and  progress HEP for strength, balance; assess  vitals.   Dion Body, PT 08/13/2022, 8:01 AM  [facility] at [facility] [address] [place], [address] [place], [place] [address] Phone # [phone] Fax # [phone]

## 2022-08-14 ENCOUNTER — Ambulatory Visit (HOSPITAL_BASED_OUTPATIENT_CLINIC_OR_DEPARTMENT_OTHER): Payer: Medicare PPO | Admitting: Family

## 2022-08-14 ENCOUNTER — Encounter (HOSPITAL_BASED_OUTPATIENT_CLINIC_OR_DEPARTMENT_OTHER): Payer: Self-pay | Admitting: Family

## 2022-08-14 VITALS — BP 124/86 | HR 95 | Ht 71.0 in | Wt 185.0 lb

## 2022-08-14 DIAGNOSIS — I10 Essential (primary) hypertension: Secondary | ICD-10-CM

## 2022-08-14 DIAGNOSIS — G4733 Obstructive sleep apnea (adult) (pediatric): Secondary | ICD-10-CM

## 2022-08-14 DIAGNOSIS — D6859 Other primary thrombophilia: Secondary | ICD-10-CM

## 2022-08-14 DIAGNOSIS — E785 Hyperlipidemia, unspecified: Secondary | ICD-10-CM

## 2022-08-14 DIAGNOSIS — I480 Paroxysmal atrial fibrillation: Secondary | ICD-10-CM

## 2022-08-14 DIAGNOSIS — I700 Atherosclerosis of aorta: Secondary | ICD-10-CM

## 2022-08-14 MED ORDER — DILTIAZEM HCL ER COATED BEADS 180 MG PO CP24: 180.0000 mg | ORAL_CAPSULE | Freq: Every day | ORAL | 5 refills | Status: DC

## 2022-08-14 NOTE — Progress Notes (Signed)
Cardiology Office Note:  .   Date:  08/14/2022  ID:  Wesley [name], DOB 1945/11/11, MRN [id number] PCP: Shon Hale, MD  [facility] Providers Cardiologist:  Orbie Pyo, MD    History of Present Illness: [name] is a 77 y.o. male atrial fibrillation, hyperlipidemia, hypertension, aortic atherosclerosis, OSA, prostate cancer.  Established April 2023 for atrial fibrillation.  Toprol increased to 37.5 mg and apixaban continue.  Referred for sleep study revealing sleep apnea.  He underwent cardioversion with successful restoration of NSR 05/24/2021.  Seen 07/23/22 recurrent atrial fib. Due to fatigue Metoprolol transitioned to Diltiazem. Underwent DCCV 07/31/2022.  Presents today for follow-up.  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  Notes he maintained NSR for about a week then return to atrial fibrillation.  Atrial fibrillation associated with fatigue, exercise intolerance.  Does note it understandably affects his mental health.  He has started physical therapy and is improved with progress in his gait instability. Reports no chest pain, pressure, or tightness. No edema, orthopnea, PND. Taking HCTZ at night and waking every hour so to go to bathroom, we will plan to move to morning dosing.  ROS: Please see the history of present illness.    All other systems reviewed and are negative.   Studies Reviewed: .        Cardiac Studies & Procedures       ECHOCARDIOGRAM  ECHOCARDIOGRAM LIMITED 06/05/2022  Narrative ECHOCARDIOGRAM LIMITED REPORT    Patient Name:   Wesley [name] [facility] Date of Exam: 06/05/2022 Medical Rec #:  [id number]         Height:       71.0 in Accession #:    [id number]        Weight:       196.2 lb Date of Birth:  [birth year]        BSA:          2.091 m Patient Age:    76 years          BP:           116/58 mmHg Patient Gender: M                 HR:           55 bpm. Exam Location:  [facility]  Procedure:  Cardiac Doppler, Limited Echo, Limited Color Doppler and 3D Echo  Indications:    I48.91 Atrial fibrillation  History:        Patient has prior history of Echocardiogram examinations, most recent 04/17/2021. Arrythmias:Atrial Fibrillation; Risk Factors:Hypertension and Dyslipidemia.  Sonographer:    Samule Ohm RDCS Referring Phys: [id number] Orbie Pyo  IMPRESSIONS   1. Left ventricular ejection fraction, by estimation, is 60 to 65%. The left ventricle has normal function. 2. Right ventricular systolic function is normal. The right ventricular size is normal. 3. The mitral valve is normal in structure. No evidence of mitral valve regurgitation. 4. Aortic valve regurgitation is not visualized.  FINDINGS Left Ventricle: Left ventricular ejection fraction, by estimation, is 60 to 65%. The left ventricle has normal function. The left ventricular internal cavity size was normal in size. There is no left ventricular hypertrophy.  Right Ventricle: The right ventricular size is normal. Right ventricular systolic function is normal.  Pericardium: There is no evidence of pericardial effusion.  Mitral Valve: The mitral valve is normal in structure.  Tricuspid Valve: Tricuspid valve regurgitation is mild.  Aortic Valve:  Aortic valve regurgitation is not visualized.  Aorta: The aortic root and ascending aorta are structurally normal, with no evidence of dilitation.  LEFT VENTRICLE PLAX 2D LVIDd:         5.20 cm   Diastology LVIDs:         3.50 cm   LV e' medial:    8.38 cm/s LV PW:         1.10 cm   LV E/e' medial:  8.9 LV IVS:        0.80 cm   LV e' lateral:   10.30 cm/s LVOT diam:     2.00 cm   LV E/e' lateral: 7.2 LV SV:         59 LV SV Index:   28 LVOT Area:     3.14 cm  3D Volume EF: 3D EF:        60 % LV EDV:       96 ml LV ESV:       38 ml LV SV:        58 ml  RIGHT VENTRICLE RVSP:           31.1 mmHg  LEFT ATRIUM         Index       RIGHT ATRIUM LA diam:     4.80 cm 2.30 cm/m  RA Pressure: 3.00 mmHg AORTIC VALVE LVOT Vmax:   85.40 cm/s LVOT Vmean:  58.300 cm/s LVOT VTI:    0.189 m  AORTA Ao Root diam: 3.70 cm Ao Asc diam:  3.60 cm  MITRAL VALVE               TRICUSPID VALVE MV Area (PHT): 2.80 cm    TR Peak grad:   28.1 mmHg MV Decel Time: 271 msec    TR Vmax:        265.00 cm/s MV E velocity: 74.40 cm/s  Estimated RAP:  3.00 mmHg MV A velocity: 37.70 cm/s  RVSP:           31.1 mmHg MV E/A ratio:  1.97 SHUNTS Systemic VTI:  0.19 m Systemic Diam: 2.00 cm  Carolan Clines Electronically signed by Carolan Clines Signature Date/Time: 06/05/2022/6:37:53 PM    Final             Risk Assessment/Calculations:    CHA2DS2-VASc Score = 3   This indicates a 3.2% annual risk of stroke. The patient's score is based upon: CHF History: 0 HTN History: 1 Diabetes History: 0 Stroke History: 0 Vascular Disease History: 0 Age Score: 2 Gender Score: 0            Physical Exam:   VS:  BP 124/86   Pulse 95   Ht 5\' 11"  (1.803 m)   Wt 185 lb (83.9 kg)   BMI 25.80 kg/m    Wt Readings from Last 3 Encounters:  08/14/22 185 lb (83.9 kg)  07/31/22 188 lb (85.3 kg)  07/23/22 188 lb 14.4 oz (85.7 kg)    GEN: Well nourished, well developed in no acute distress NECK: No JVD; No carotid bruits CARDIAC: IRIR, no murmurs, rubs, gallops RESPIRATORY:  Clear to auscultation without rales, wheezing or rhonchi  ABDOMEN: Soft, non-tender, non-distended EXTREMITIES:  No edema; No deformity   ASSESSMENT AND PLAN: .    PAF / Hypercoagulable state -initial atrial fibrillation/2023 s/p successful cardioversion 05/20/2021.  Had recurrent atrial fibrillation s/p cardioversion 07/31/22 with recurrent atrial fibrillation 1 week later.  EKG today rate controlled atrial  fibrillation 95 bpm.  Atrial fibrillation symptomatic with fatigue, exercise intolerance.  Metoprolol previously discontinued due to fatigue.   Increase diltiazem from 120 to 180 mg daily for BP/rate  control.  Considered Amiodarone but cross reaction given iodine allergy. Will reach out to EP team for further recommendations prior to his 09/07/22 consult with Dr. Elberta Fortis.   HTN -controlled in clinic but mildly elevated at home.  Encouraged to bring home BP cuff to next office that to ensure accuracy.  For optimization of BP and rate control will increase diltiazem from 120 to 180 mg daily.  OSA - CPAP compliance encouraged.   Gait instability - Concerned about falls. Participating in PT with improvement.  HLD / Aortic atherosclerosis - Stable with no anginal symptoms. No indication for ischemic evaluation.  Continue Atorvastatin 10mg  daily.       Dispo: follow up as scheduled with Dr. Elberta Fortis  Signed, Alver Sorrow, NP

## 2022-08-14 NOTE — Patient Instructions (Addendum)
Medication Instructions:  Your physician has recommended you make the following change in your medication:    Move Hydrochlorothiazide to the morning Do not take tonight, wait to take until tomorrow morning  INCREASE Diltiazem to 180mg  daily  *If you need a refill on your cardiac medications before your next appointment, please call your pharmacy*  Follow-Up: At [facility], you and your health needs are our priority.  As part of our continuing mission to provide you with exceptional heart care, we have created designated Provider Care Teams.  These Care Teams include your primary Cardiologist (physician) and Advanced Practice Providers (APPs -  Physician Assistants and Nurse Practitioners) who all work together to provide you with the care you need, when you need it.  We recommend signing up for the patient portal called "MyChart".  Sign up information is provided on this After Visit Summary.  MyChart is used to connect with patients for Virtual Visits (Telemedicine).  Patients are able to view lab/test results, encounter notes, upcoming appointments, etc.  Non-urgent messages can be sent to your provider as well.   To learn more about what you can do with MyChart, go to [email or web address].    Your next appointment:   As scheduled with Dr. Elberta Fortis  Other Instructions   We have reached out to our EP team about options for an anti-arrhythmic to help keep you in normal rhythm. We will reach out to you once we have those recommendations.

## 2022-08-15 ENCOUNTER — Ambulatory Visit: Payer: Medicare PPO | Admitting: Physical Therapy

## 2022-08-15 ENCOUNTER — Encounter: Payer: Self-pay | Admitting: Physical Therapy

## 2022-08-15 DIAGNOSIS — R2689 Other abnormalities of gait and mobility: Secondary | ICD-10-CM

## 2022-08-15 DIAGNOSIS — M6281 Muscle weakness (generalized): Secondary | ICD-10-CM | POA: Diagnosis not present

## 2022-08-15 DIAGNOSIS — R262 Difficulty in walking, not elsewhere classified: Secondary | ICD-10-CM | POA: Diagnosis not present

## 2022-08-15 DIAGNOSIS — R2681 Unsteadiness on feet: Secondary | ICD-10-CM

## 2022-08-15 DIAGNOSIS — H0015 Chalazion left lower eyelid: Secondary | ICD-10-CM | POA: Diagnosis not present

## 2022-08-15 NOTE — Therapy (Signed)
OUTPATIENT PHYSICAL THERAPY NEURO TREATMENT NOTE   Patient Name: Wesley [name] MRN: [id number] DOB:1945/04/09, 77 y.o., male Today's Date: 08/15/2022   PCP:   Shon Hale, MD   REFERRING PROVIDER: Alver Sorrow, NP   END OF SESSION:  PT End of Session - 08/15/22 1239     Visit Number 5    Number of Visits 17    Date for PT Re-Evaluation 09/21/22    Authorization Type [employer]    Authorization Time Period 07/26/2022-09/21/2022    Authorization - Visit Number 4    Authorization - Number of Visits 17    Progress Note Due on Visit 10    PT Start Time 1235    PT Stop Time 1315    PT Time Calculation (min) 40 min    Activity Tolerance Patient tolerated treatment well    Behavior During Therapy WFL for tasks assessed/performed               Past Medical History:  Diagnosis Date   Depression    Diverticulosis    Hyperlipidemia    Hypertension    Migraine    OSA on CPAP    mild obstructive sleep apnea with an AHI of 8 and O2 saturations as low as 85%.  On CPAP 10cm H2O   Past Surgical History:  Procedure Laterality Date   CARDIOVERSION N/A 05/24/2021   Procedure: CARDIOVERSION;  Surgeon: Little Ishikawa, MD;  Location: [facility] ENDOSCOPY;  Service: Cardiovascular;  Laterality: N/A;   CARDIOVERSION N/A 07/31/2022   Procedure: CARDIOVERSION;  Surgeon: Thomasene Ripple, DO;  Location: [facility] INVASIVE CV LAB;  Service: Cardiovascular;  Laterality: N/A;   CYSTOSCOPY WITH INSERTION OF UROLIFT N/A 04/28/2019   Procedure: CYSTOSCOPY WITH INSERTION OF UROLIFT;  Surgeon: Jerilee Field, MD;  Location[facility];  Service: Urology;  Laterality: N/A;   GANGLION CYST EXCISION     PROSTATE BIOPSY N/A 04/28/2019   Procedure: BIOPSY TRANSRECTAL ULTRASONIC PROSTATE (TUBP);  Surgeon: Jerilee Field, MD;  Location: [facility];  Service: Urology;  Laterality: N/A;   Patient Active Problem List   Diagnosis Date Noted   OSA on CPAP  01/03/2022   Persistent atrial fibrillation (HCC)    Diverticulitis of colon with perforation 12/18/2020   Bruxism 05/07/2017   Laryngopharyngeal reflux (LPR) 05/07/2017   Presbycusis of both ears 05/07/2017   Hemorrhoids, external, thrombosed 02/13/2012    ONSET DATE: 07/23/2022 (MD referral)  REFERRING DIAG: R26.81 (ICD-10-CM) - Gait instability   THERAPY DIAG:  Unsteadiness on feet  Muscle weakness (generalized)  Other abnormalities of gait and mobility  Rationale for Evaluation and Treatment: Rehabilitation  SUBJECTIVE:  SUBJECTIVE STATEMENT: Saw cardiologist yesterday and they increased a medication and I'm to see another cardiologist specialist.  Have an eye condition that is painful (MD is aware) Pt accompanied by: self  PERTINENT HISTORY: A-fib, scheduled for cardioversion  PAIN:  Are you having pain? No  PRECAUTIONS: Fall and Other: a-fib, cardioversion to be performed 07/31/2022  RED FLAGS: None   WEIGHT BEARING RESTRICTIONS: No  FALLS: Has patient fallen in last 6 months? Yes. Number of falls 2 in past year Usually when I first get up and don't have my balance. Have difficulty going down the stairs.  LIVING ENVIRONMENT: Lives with: lives with their spouse Lives in: House/apartment Stairs:  has second floor, but can live on first level.  Has 8 steps with L handrail Has following equipment at home: None  PLOF: Independent  Enjoys walking in neighborhood, travel.  Retired Optician, dispensing  PATIENT GOALS: To regain balance, strength  OBJECTIVE:    TODAY'S TREATMENT: 08/15/2022 Activity Comments  Vitals 132/101 HR 91 bpm   After 5 minutes: Vitals 133/90 HR 94 bpm   NuStep, Level 4 x 10 minutes, BLEs only For CV endurance and for lower extremity strength, flexibility   Sit<>stand + plantarflexion   Sit<>stand with overhead lift with light weighted ball Good momentum forward to stand  Vitals:  121/90 HR 88 bpm   Standing balance:  SLS with foot alt step taps to 6" and 12" steps, then SLS foot propped on 6" step with head turns, head nods, EC head steady 2 x 10 sec Increased unsteadiness with RLE as stance           PATIENT EDUCATION: Education details: continue current HEP; BP parameters for physical activity, signs/symptoms of elevated BP Person educated: Patient Education method: Explanation, Demonstration, and Handouts Education comprehension: verbalized understanding, returned demonstration, and needs further education  Access Code: [email or web address] URL: [email or web address] Date: 08/08/2022 Prepared by: [facility] - Outpatient  Rehab - [facility]  Exercises - Romberg Stance with Head Rotation  - 1 x daily - 7 x weekly - 1-2 sets - 10 reps - Romberg Stance with Head Nods  - 1 x daily - 7 x weekly - 1-2 sets - 10 reps - Romberg Stance with Eyes Closed  - 1 x daily - 7 x weekly - 1 sets - 3 reps - 30 sec hold - Backward Walking with Counter Support  - 1 x daily - 7 x weekly - 1 sets - 3-5 reps - Tandem Walking with Counter Support  - 1 x daily - 7 x weekly - 1 sets - 3-5 reps - Sit to Stand with Arm Reach and Jump (sit to stand then ankle PF)  - 1 x daily - 7 x weekly - 3 sets - 10 reps ---------------------------------------------------------------------- Objective measure below taken at time of initial eval:  DIAGNOSTIC FINDINGS: NA for this episode  COGNITION: Overall cognitive status: Within functional limits for tasks assessed   SENSATION: WFL  POSTURE: rounded shoulders and forward head  LOWER EXTREMITY ROM:   A/ROM BLEs  LOWER EXTREMITY MMT:    MMT Right Eval Left Eval  Hip flexion 4 4  Hip extension    Hip abduction    Hip adduction    Hip internal rotation    Hip external rotation    Knee flexion 4+ 4+   Knee extension 4+ 4+  Ankle dorsiflexion 4 4  Ankle plantarflexion    Ankle inversion    Ankle eversion    (Blank rows =  not tested)    TRANSFERS: Assistive device utilized: None  Sit to stand: Modified independence Stand to sit: Modified independence *Reports needing UE support to push up to get up from floor  GAIT: Gait pattern: step through pattern and wide BOS Distance walked: 320 ft Assistive device utilized: None Level of assistance: Modified independence  FUNCTIONAL TESTS:   Vitals:  133/88, HR 102 5 times sit to stand: 20.53 sec 5M walk:  12.3 sec (2.67 ft/sec) M-CTSIB  Condition 1: Firm Surface, EO 30 Sec, Mild Sway  Condition 2: Firm Surface, EC 30 Sec, Moderate Sway  Condition 3: Foam Surface, EO 30 Sec, Moderate Sway  Condition 4: Foam Surface, EC 6.6 Sec, Severe Sway      TODAY'S TREATMENT:                                                                                                                              DATE: 07/26/2022    PATIENT EDUCATION: Education details: Eval results, POC Person educated: Patient Education method: Explanation Education comprehension: verbalized understanding  HOME EXERCISE PROGRAM: Not yet initiated  GOALS: Goals reviewed with patient? Yes  SHORT TERM GOALS: Target date: 08/24/2022  Pt will be independent with HEP for improved strength, balance, gait. Baseline: Goal status: IN PROGRESS  2.  Pt will improve 5x sit<>stand to less than or equal to 15 sec to demonstrate improved functional strength and transfer efficiency. Baseline: 20.53 sec Goal status: IN PROGRESS   LONG TERM GOALS: Target date: 09/21/2022  Pt will be independent with HEP for improved strength, balance, gait. Baseline:  Goal status: IN PROGRESS  2.  Pt will improve 5x sit<>stand to less than or equal to 12.5 sec to demonstrate improved functional strength and transfer efficiency. Baseline: 20.53 sec Goal status:IN PROGRESS  3.  Pt will  improve Condition 4 on MCTSIB to 30 sec with moderate or less sway. Baseline:  Goal status: IN PROGRESS  4.  distance to improve to 375 ft for improved gait efficiency and safety. Baseline:  Goal status: IN PROGRESS  5.  FGA score to improve to 22/30 for improved balance, decreased fall risk. Baseline: 16/30 Goal status: IN PROGRESS  6.  Pt will perform floor>stand transfer with minimal to no UE external support, independently. Baseline:  Goal status: IN PROGRESS  ASSESSMENT:  CLINICAL IMPRESSION: Skilled PT session today, focused on gentle aerobic activity and dynamic balance.  Pt has seen cardiologist and medication has been adjusted to address elevated BP measures.  He is able to tolerate gentle cardiac exercise and balance activities with brief rest breaks.  With SLS activities, more unsteadiness noted with RLE as stance leg.  Continued sessions indicated to progress balance and strength activities to reduce risk for falls  OBJECTIVE IMPAIRMENTS: Abnormal gait, decreased activity tolerance, decreased balance, decreased endurance, decreased mobility, difficulty walking, and decreased strength.   ACTIVITY LIMITATIONS: standing, squatting, stairs, transfers, and locomotion level  PARTICIPATION LIMITATIONS: community activity  and occupation  PERSONAL FACTORS: 3+ comorbidities: see PMH  are also affecting patient's functional outcome.   REHAB POTENTIAL: Good  CLINICAL DECISION MAKING: Evolving/moderate complexity  EVALUATION COMPLEXITY: Moderate  PLAN:  PT FREQUENCY: 2x/week  PT DURATION: 8 weeks plus eval  PLANNED INTERVENTIONS: Therapeutic exercises, Therapeutic activity, Neuromuscular re-education, Balance training, Gait training, Patient/Family education, Self Care, and Stair training  PLAN FOR NEXT SESSION: Check STGs.Review and progress HEP for strength, balance; assess  vitals.   Gean Maidens., PT 08/15/2022, 3:26 PM  [facility] at  [facility] [address] [place], [address] [place], [place] [address] Phone # [phone] Fax # [phone]

## 2022-08-22 ENCOUNTER — Ambulatory Visit: Payer: Medicare PPO | Admitting: Physical Therapy

## 2022-08-23 ENCOUNTER — Ambulatory Visit (HOSPITAL_COMMUNITY)
Admission: RE | Admit: 2022-08-23 | Discharge: 2022-08-23 | Disposition: A | Discharge status: Home / Self Care | Payer: Medicare PPO | Source: Ambulatory Visit | Attending: Internal Medicine | Admitting: Internal Medicine

## 2022-08-23 VITALS — BP 130/100 | HR 102 | Ht 71.0 in | Wt 184.4 lb

## 2022-08-23 DIAGNOSIS — D6869 Other thrombophilia: Secondary | ICD-10-CM | POA: Diagnosis not present

## 2022-08-23 DIAGNOSIS — R9431 Abnormal electrocardiogram [ECG] [EKG]: Secondary | ICD-10-CM | POA: Diagnosis not present

## 2022-08-23 DIAGNOSIS — I4819 Other persistent atrial fibrillation: Secondary | ICD-10-CM | POA: Diagnosis not present

## 2022-08-23 NOTE — Progress Notes (Addendum)
Primary Care Physician: Shon Hale, MD Primary Cardiologist: Orbie Pyo, MD Electrophysiologist: None     Referring Physician: Gillian Shields, NP     Wesley [name] is a 77 y.o. male with a history of HTN, HLD, aortic atherosclerosis by CT imaging, OSA, prostate cancer, and atrial fibrillation who presents for consultation in the [facility]. Recently underwent successful DCCV on 07/31/22 and seen by Cardiology on 08/14/22 with ERAF. Diltiazem increased to 180 mg daily. Patient is on Eliquis for a CHADS2VASC score of 4.  On evaluation today, he is currently in Afib. He feels fatigue and exercise intolerance when in Afib. He has ERAF s/p DCCV on 07/31/22. He is not compliant with CPAP. Drinks 1 glass of wine nightly.   Today, he denies symptoms of palpitations, chest pain, shortness of breath, orthopnea, PND, lower extremity edema, dizziness, presyncope, syncope, snoring, daytime somnolence, bleeding, or neurologic sequela. The patient is tolerating medications without difficulties and is otherwise without complaint today.    Atrial Fibrillation Risk Factors:  he does have symptoms or diagnosis of sleep apnea. he is NOT compliant with CPAP therapy.  he has a BMI of Body mass index is 25.72 kg/m.Marland Kitchen Filed Weights   08/23/22 1100  Weight: 83.6 kg    Current Outpatient Medications  Medication Sig Dispense Refill   apixaban (ELIQUIS) 5 MG TABS tablet Take 1 tablet twice daily.  Please call and make an appt with Dr Lynnette Caffey for your yearly check up. 60 tablet 1   atorvastatin (LIPITOR) 10 MG tablet Take 10 mg by mouth daily.     buPROPion (WELLBUTRIN XL) 150 MG 24 hr tablet Take 150 mg by mouth daily.     diltiazem (CARDIZEM CD) 180 MG 24 hr capsule Take 1 capsule (180 mg total) by mouth daily. 30 capsule 5   hydrochlorothiazide (MICROZIDE) 12.5 MG capsule Take 12.5 mg by mouth daily.     neomycin-polymyxin b-dexamethasone (MAXITROL)  3.5-10000-0.1 OINT Place into the left eye 2 (two) times daily.     tamsulosin (FLOMAX) 0.4 MG CAPS capsule Take 0.4 mg by mouth daily after breakfast.     valACYclovir (VALTREX) 1000 MG tablet Take 1,000 mg by mouth every 12 (twelve) hours as needed (for flare up).      No current facility-administered medications for this encounter.    Atrial Fibrillation Management history:  Previous antiarrhythmic drugs: None Previous cardioversions: 05/24/21, 07/31/22 Previous ablations: None Anticoagulation history: Eliquis   ROS- All systems are reviewed and negative except as per the HPI above.  Physical Exam: BP (!) 130/100   Pulse (!) 102   Ht 5\' 11"  (1.803 m)   Wt 83.6 kg   BMI 25.72 kg/m   GEN: Well nourished, well developed in no acute distress NECK: No JVD; No carotid bruits CARDIAC: Irregularly irregular rate and rhythm, no murmurs, rubs, gallops RESPIRATORY:  Clear to auscultation without rales, wheezing or rhonchi  ABDOMEN: Soft, non-tender, non-distended EXTREMITIES:  No edema; No deformity   EKG today demonstrates  Vent. rate 102 BPM PR interval * ms QRS duration 88 ms QT/QTcB 320/417 ms P-R-T axes * 12 32 Atrial fibrillation with rapid ventricular response Nonspecific ST and T wave abnormality Abnormal ECG When compared with ECG of 14-Aug-2022 08:25, PREVIOUS ECG IS PRESENT  Echo 06/05/22 demonstrated   1. Left ventricular ejection fraction, by estimation, is 60 to 65%. The  left ventricle has normal function.   2. Right ventricular systolic function is normal. The  right ventricular  size is normal.   3. The mitral valve is normal in structure. No evidence of mitral valve  regurgitation.   4. Aortic valve regurgitation is not visualized.    ASSESSMENT & PLAN CHA2DS2-VASc Score = 4  The patient's score is based upon: CHF History: 0 HTN History: 1 Diabetes History: 0 Stroke History: 0 Vascular Disease History: 1 Age Score: 2 Gender Score: 0        ASSESSMENT AND PLAN: Persistent Atrial Fibrillation (ICD10:  I48.19) The patient's CHA2DS2-VASc score is 4, indicating a 4.8% annual risk of stroke.    He has ERAF after recent cardioversion on 07/31/22. He is currently in what appears to be coarse Afib. He normally is in the 80-90s at home for HR. We discussed options for treatment including ablation and medication therapy. Due to possible allergy to iodine contrast noted in history, I am a bit hesitant to try amiodarone on patient. We discussed options of treatment including Multaq or Tikosyn. He is very likely to proceed with ablation and will check insurance on pricing for medications.   Qtc 421 ms in NSR.  Secondary Hypercoagulable State (ICD10:  D68.69) The patient is at significant risk for stroke/thromboembolism based upon his CHA2DS2-VASc Score of 4.  Continue Apixaban (Eliquis).  No missed doses.     Follow up as scheduled with Dr. Elberta Fortis.    Lake Bells, PA-C  Afib Clinic [facility] [address] [place], [place] [address] [phone]

## 2022-08-24 ENCOUNTER — Encounter: Payer: Self-pay | Admitting: Physical Therapy

## 2022-08-24 ENCOUNTER — Ambulatory Visit: Payer: Medicare PPO | Admitting: Physical Therapy

## 2022-08-24 DIAGNOSIS — R2689 Other abnormalities of gait and mobility: Secondary | ICD-10-CM

## 2022-08-24 DIAGNOSIS — R262 Difficulty in walking, not elsewhere classified: Secondary | ICD-10-CM | POA: Diagnosis not present

## 2022-08-24 DIAGNOSIS — R2681 Unsteadiness on feet: Secondary | ICD-10-CM

## 2022-08-24 DIAGNOSIS — M6281 Muscle weakness (generalized): Secondary | ICD-10-CM | POA: Diagnosis not present

## 2022-08-24 NOTE — Therapy (Signed)
OUTPATIENT PHYSICAL THERAPY NEURO TREATMENT NOTE   Patient Name: Wesley [name] MRN: [id number] DOB:1945/05/28, 77 y.o., male Today's Date: 08/24/2022   PCP:   Shon Hale, MD   REFERRING PROVIDER: Alver Sorrow, NP   END OF SESSION:  PT End of Session - 08/24/22 0800     Visit Number 6    Number of Visits 17    Date for PT Re-Evaluation 09/21/22    Authorization Type [employer]    Authorization Time Period 07/26/2022-09/21/2022    Authorization - Visit Number 5    Authorization - Number of Visits 17    Progress Note Due on Visit 10    PT Start Time 0802    PT Stop Time 0843    PT Time Calculation (min) 41 min    Activity Tolerance Patient tolerated treatment well    Behavior During Therapy WFL for tasks assessed/performed                Past Medical History:  Diagnosis Date   Depression    Diverticulosis    Hyperlipidemia    Hypertension    Migraine    OSA on CPAP    mild obstructive sleep apnea with an AHI of 8 and O2 saturations as low as 85%.  On CPAP 10cm H2O   Past Surgical History:  Procedure Laterality Date   CARDIOVERSION N/A 05/24/2021   Procedure: CARDIOVERSION;  Surgeon: Little Ishikawa, MD;  Location: [facility] ENDOSCOPY;  Service: Cardiovascular;  Laterality: N/A;   CARDIOVERSION N/A 07/31/2022   Procedure: CARDIOVERSION;  Surgeon: Thomasene Ripple, DO;  Location: [facility] INVASIVE CV LAB;  Service: Cardiovascular;  Laterality: N/A;   CYSTOSCOPY WITH INSERTION OF UROLIFT N/A 04/28/2019   Procedure: CYSTOSCOPY WITH INSERTION OF UROLIFT;  Surgeon: Jerilee Field, MD;  Location: [facility];  Service: Urology;  Laterality: N/A;   GANGLION CYST EXCISION     PROSTATE BIOPSY N/A 04/28/2019   Procedure: BIOPSY TRANSRECTAL ULTRASONIC PROSTATE (TUBP);  Surgeon: Jerilee Field, MD;  Location: [facility];  Service: Urology;  Laterality: N/A;   Patient Active Problem List   Diagnosis Date Noted    Hypercoagulable state due to persistent atrial fibrillation (HCC) 08/23/2022   OSA on CPAP 01/03/2022   Persistent atrial fibrillation (HCC)    Diverticulitis of colon with perforation 12/18/2020   Bruxism 05/07/2017   Laryngopharyngeal reflux (LPR) 05/07/2017   Presbycusis of both ears 05/07/2017   Hemorrhoids, external, thrombosed 02/13/2012    ONSET DATE: 07/23/2022 (MD referral)  REFERRING DIAG: R26.81 (ICD-10-CM) - Gait instability   THERAPY DIAG:  Muscle weakness (generalized)  Other abnormalities of gait and mobility  Unsteadiness on feet  Rationale for Evaluation and Treatment: Rehabilitation  SUBJECTIVE:  SUBJECTIVE STATEMENT: Saw the cardiologist yesterday; went into [facility] the other day due to waking up with a bad headache.  The eye is much better.  Everything checked out okay, but the cardiologist is trying to figure out about the cardiac ablation. Pt accompanied by: self  PERTINENT HISTORY: A-fib, scheduled for cardioversion  PAIN:  Are you having pain? No  PRECAUTIONS: Fall and Other: a-fib, cardioversion to be performed 07/31/2022  RED FLAGS: None   WEIGHT BEARING RESTRICTIONS: No  FALLS: Has patient fallen in last 6 months? Yes. Number of falls 2 in past year Usually when I first get up and don't have my balance. Have difficulty going down the stairs.  LIVING ENVIRONMENT: Lives with: lives with their spouse Lives in: House/apartment Stairs:  has second floor, but can live on first level.  Has 8 steps with L handrail Has following equipment at home: None  PLOF: Independent  Enjoys walking in neighborhood, travel.  Retired Optician, dispensing  PATIENT GOALS: To regain balance, strength  OBJECTIVE:    TODAY'S TREATMENT: 08/24/2022 Activity Comments  Vitals:  135/89 HR 65 bpm    NuStep, Level 4 x 8 minutes, BLEs only For CV endurance and for lower extremity strength, flexibility  Review of HEP Good return demo  5x sit<>stand:  13.84 sec   Tandem march 2 x 10 ft  1 UE support  Forward/back walk in parallel bars Cues for wider BOS  Vitals:  HR 97-112; O2 97%   Standing on Airex: Feet apart/feet together EO and EC head turns/head nods; head steady EC 30 sec Increased sway with EC              PATIENT EDUCATION: Education details: continue current HEP (*look for corner, look for compliant surface); discussed options for cardiac rehab (as pt may have to have cardiac ablation procedure) Person educated: Patient Education method: Explanation, Demonstration, and Handouts Education comprehension: verbalized understanding, returned demonstration, and needs further education  Access Code: [email or web address] URL: [email or web address] Date: 08/08/2022 Prepared by: [facility] - Outpatient  Rehab - [facility]  Exercises - Romberg Stance with Head Rotation  - 1 x daily - 7 x weekly - 1-2 sets - 10 reps - Romberg Stance with Head Nods  - 1 x daily - 7 x weekly - 1-2 sets - 10 reps - Romberg Stance with Eyes Closed  - 1 x daily - 7 x weekly - 1 sets - 3 reps - 30 sec hold - Backward Walking with Counter Support  - 1 x daily - 7 x weekly - 1 sets - 3-5 reps (forward/back walk) - Tandem Walking with Counter Support  - 1 x daily - 7 x weekly - 1 sets - 3-5 reps-(tandem march) - Sit to Stand with Arm Reach and Jump (sit to stand then ankle PF)  - 1 x daily - 7 x weekly - 3 sets - 10 reps ---------------------------------------------------------------------- Objective measure below taken at time of initial eval:  DIAGNOSTIC FINDINGS: NA for this episode  COGNITION: Overall cognitive status: Within functional limits for tasks assessed   SENSATION: WFL  POSTURE: rounded shoulders and forward head  LOWER EXTREMITY ROM:   A/ROM BLEs  LOWER EXTREMITY MMT:     MMT Right Eval Left Eval  Hip flexion 4 4  Hip extension    Hip abduction    Hip adduction    Hip internal rotation    Hip external rotation    Knee flexion 4+ 4+  Knee extension 4+ 4+  Ankle dorsiflexion 4 4  Ankle plantarflexion    Ankle inversion    Ankle eversion    (Blank rows = not tested)    TRANSFERS: Assistive device utilized: None  Sit to stand: Modified independence Stand to sit: Modified independence *Reports needing UE support to push up to get up from floor  GAIT: Gait pattern: step through pattern and wide BOS Distance walked: 320 ft Assistive device utilized: None Level of assistance: Modified independence  FUNCTIONAL TESTS:   Vitals:  133/88, HR 102 5 times sit to stand: 20.53 sec 11M walk:  12.3 sec (2.67 ft/sec) M-CTSIB  Condition 1: Firm Surface, EO 30 Sec, Mild Sway  Condition 2: Firm Surface, EC 30 Sec, Moderate Sway  Condition 3: Foam Surface, EO 30 Sec, Moderate Sway  Condition 4: Foam Surface, EC 6.6 Sec, Severe Sway      TODAY'S TREATMENT:                                                                                                                              DATE: 07/26/2022    PATIENT EDUCATION: Education details: Eval results, POC Person educated: Patient Education method: Explanation Education comprehension: verbalized understanding  HOME EXERCISE PROGRAM: Not yet initiated  GOALS: Goals reviewed with patient? Yes  SHORT TERM GOALS: Target date: 08/24/2022  Pt will be independent with HEP for improved strength, balance, gait. Baseline: Goal status: IN PROGRESS  2.  Pt will improve 5x sit<>stand to less than or equal to 15 sec to demonstrate improved functional strength and transfer efficiency. Baseline: 20.53 sec>13.84 sec 08/24/2022 Goal status: MET   LONG TERM GOALS: Target date: 09/21/2022  Pt will be independent with HEP for improved strength, balance, gait. Baseline:  Goal status: IN PROGRESS  2.  Pt  will improve 5x sit<>stand to less than or equal to 12.5 sec to demonstrate improved functional strength and transfer efficiency. Baseline: 20.53 sec Goal status:IN PROGRESS  3.  Pt will improve Condition 4 on MCTSIB to 30 sec with moderate or less sway. Baseline:  Goal status: IN PROGRESS  4.  distance to improve to 375 ft for improved gait efficiency and safety. Baseline:  Goal status: IN PROGRESS  5.  FGA score to improve to 22/30 for improved balance, decreased fall risk. Baseline: 16/30 Goal status: IN PROGRESS  6.  Pt will perform floor>stand transfer with minimal to no UE external support, independently. Baseline:  Goal status: IN PROGRESS  ASSESSMENT:  CLINICAL IMPRESSION: Assessed STGs today, with pt meeting 2 of 2 STGs.  He has met goal for HEP and he has improved FTSTS score, indicating improved funcitonal strength.  Continued to focus on gentle aerobic activity and dynamic balance.  He continues to have decreased steadiness with vision removed on solid and on compliant surfaces.  He will continue to benefit from skilled PT towards goals for improved functional mobility and balance.  OBJECTIVE IMPAIRMENTS: Abnormal gait, decreased activity tolerance, decreased balance,  decreased endurance, decreased mobility, difficulty walking, and decreased strength.   ACTIVITY LIMITATIONS: standing, squatting, stairs, transfers, and locomotion level  PARTICIPATION LIMITATIONS: community activity and occupation  PERSONAL FACTORS: 3+ comorbidities: see PMH  are also affecting patient's functional outcome.   REHAB POTENTIAL: Good  CLINICAL DECISION MAKING: Evolving/moderate complexity  EVALUATION COMPLEXITY: Moderate  PLAN:  PT FREQUENCY: 2x/week  PT DURATION: 8 weeks plus eval  PLANNED INTERVENTIONS: Therapeutic exercises, Therapeutic activity, Neuromuscular re-education, Balance training, Gait training, Patient/Family education, Self Care, and Stair training  PLAN FOR  NEXT SESSION: Work on balance on compliant surfaces.  Progress HEP for strength, balance (compliant surface for home); assess  vitals.   Gean Maidens., PT 08/24/2022, 8:47 AM  [facility] Outpatient Rehab at [facility] [address] [place], [address] [place], [place] [address] Phone # [phone] Fax # [phone]

## 2022-08-29 ENCOUNTER — Ambulatory Visit: Payer: Medicare PPO

## 2022-08-29 DIAGNOSIS — R2681 Unsteadiness on feet: Secondary | ICD-10-CM | POA: Diagnosis not present

## 2022-08-29 DIAGNOSIS — R2689 Other abnormalities of gait and mobility: Secondary | ICD-10-CM | POA: Diagnosis not present

## 2022-08-29 DIAGNOSIS — M6281 Muscle weakness (generalized): Secondary | ICD-10-CM | POA: Diagnosis not present

## 2022-08-29 DIAGNOSIS — R262 Difficulty in walking, not elsewhere classified: Secondary | ICD-10-CM | POA: Diagnosis not present

## 2022-08-29 NOTE — Therapy (Signed)
OUTPATIENT PHYSICAL THERAPY NEURO TREATMENT NOTE   Patient Name: Wesley [name] MRN: [id number] DOB:06/29/1945, 77 y.o., male Today's Date: 08/29/2022   PCP:   Shon Hale, MD   REFERRING PROVIDER: Alver Sorrow, NP   END OF SESSION:  PT End of Session - 08/29/22 0751     Visit Number 7    Number of Visits 17    Date for PT Re-Evaluation 09/21/22    Authorization Type [employer]    Authorization Time Period 07/26/2022-09/21/2022    Authorization - Visit Number 7    Authorization - Number of Visits 17    Progress Note Due on Visit 10    PT Start Time 0800    PT Stop Time 0845    PT Time Calculation (min) 45 min    Activity Tolerance Patient tolerated treatment well    Behavior During Therapy WFL for tasks assessed/performed                Past Medical History:  Diagnosis Date   Depression    Diverticulosis    Hyperlipidemia    Hypertension    Migraine    OSA on CPAP    mild obstructive sleep apnea with an AHI of 8 and O2 saturations as low as 85%.  On CPAP 10cm H2O   Past Surgical History:  Procedure Laterality Date   CARDIOVERSION N/A 05/24/2021   Procedure: CARDIOVERSION;  Surgeon: Little Ishikawa, MD;  Location: [facility] ENDOSCOPY;  Service: Cardiovascular;  Laterality: N/A;   CARDIOVERSION N/A 07/31/2022   Procedure: CARDIOVERSION;  Surgeon: Thomasene Ripple, DO;  Location: [facility] INVASIVE CV LAB;  Service: Cardiovascular;  Laterality: N/A;   CYSTOSCOPY WITH INSERTION OF UROLIFT N/A 04/28/2019   Procedure: CYSTOSCOPY WITH INSERTION OF UROLIFT;  Surgeon: Jerilee Field, MD;  Location: [facility];  Service: Urology;  Laterality: N/A;   GANGLION CYST EXCISION     PROSTATE BIOPSY N/A 04/28/2019   Procedure: BIOPSY TRANSRECTAL ULTRASONIC PROSTATE (TUBP);  Surgeon: Jerilee Field, MD;  Location: [facility];  Service: Urology;  Laterality: N/A;   Patient Active Problem List   Diagnosis Date Noted    Hypercoagulable state due to persistent atrial fibrillation (HCC) 08/23/2022   OSA on CPAP 01/03/2022   Persistent atrial fibrillation (HCC)    Diverticulitis of colon with perforation 12/18/2020   Bruxism 05/07/2017   Laryngopharyngeal reflux (LPR) 05/07/2017   Presbycusis of both ears 05/07/2017   Hemorrhoids, external, thrombosed 02/13/2012    ONSET DATE: 07/23/2022 (MD referral)  REFERRING DIAG: R26.81 (ICD-10-CM) - Gait instability   THERAPY DIAG:  Muscle weakness (generalized)  Other abnormalities of gait and mobility  Unsteadiness on feet  Difficulty in walking, not elsewhere classified  Balance problem  Rationale for Evaluation and Treatment: Rehabilitation  SUBJECTIVE:  SUBJECTIVE STATEMENT: "I feel that the majority of the dizziness, weakness, and lightheadedness is from the medication/A-fib" Pt accompanied by: self  PERTINENT HISTORY: A-fib, scheduled for cardioversion  PAIN:  Are you having pain? No  PRECAUTIONS: Fall and Other: a-fib, cardioversion to be performed 07/31/2022  RED FLAGS: None   WEIGHT BEARING RESTRICTIONS: No  FALLS: Has patient fallen in last 6 months? Yes. Number of falls 2 in past year Usually when I first get up and don't have my balance. Have difficulty going down the stairs.  LIVING ENVIRONMENT: Lives with: lives with their spouse Lives in: House/apartment Stairs:  has second floor, but can live on first level.  Has 8 steps with L handrail Has following equipment at home: None  PLOF: Independent  Enjoys walking in neighborhood, travel.  Retired Optician, dispensing  PATIENT GOALS: To regain balance, strength  OBJECTIVE:   TODAY'S TREATMENT: 08/29/22 Activity Comments  Vitals (seated)  133/98 mmHg, 78 bpm  Discussion of exercise strategies for those  with A-fib/cardiac considerations   Standing balance Multi-sensory balance challenges with addition of postural perturbations                 TODAY'S TREATMENT: 08/24/2022 Activity Comments  Vitals:  135/89 HR 65 bpm   NuStep, Level 4 x 8 minutes, BLEs only For CV endurance and for lower extremity strength, flexibility  Review of HEP Good return demo  5x sit<>stand:  13.84 sec   Tandem march 2 x 10 ft  1 UE support  Forward/back walk in parallel bars Cues for wider BOS  Vitals:  HR 97-112; O2 97%   Standing on Airex: Feet apart/feet together EO and EC head turns/head nods; head steady EC 30 sec Increased sway with EC              PATIENT EDUCATION: Education details: continue current HEP (*look for corner, look for compliant surface); discussed options for cardiac rehab (as pt may have to have cardiac ablation procedure) Person educated: Patient Education method: Explanation, Demonstration, and Handouts Education comprehension: verbalized understanding, returned demonstration, and needs further education  Access Code: [email or web address] URL: [email or web address] Date: 08/08/2022 Prepared by: [facility] - Outpatient  Rehab - [facility]  Exercises - Romberg Stance with Head Rotation  - 1 x daily - 7 x weekly - 1-2 sets - 10 reps - Romberg Stance with Head Nods  - 1 x daily - 7 x weekly - 1-2 sets - 10 reps - Romberg Stance with Eyes Closed  - 1 x daily - 7 x weekly - 1 sets - 3 reps - 30 sec hold - Backward Walking with Counter Support  - 1 x daily - 7 x weekly - 1 sets - 3-5 reps (forward/back walk) - Tandem Walking with Counter Support  - 1 x daily - 7 x weekly - 1 sets - 3-5 reps-(tandem march) - Sit to Stand with Arm Reach and Jump (sit to stand then ankle PF)  - 1 x daily - 7 x weekly - 3 sets - 10 reps ---------------------------------------------------------------------- Objective measure below taken at time of initial eval:  DIAGNOSTIC FINDINGS: NA  for this episode  COGNITION: Overall cognitive status: Within functional limits for tasks assessed   SENSATION: WFL  POSTURE: rounded shoulders and forward head  LOWER EXTREMITY ROM:   A/ROM BLEs  LOWER EXTREMITY MMT:    MMT Right Eval Left Eval  Hip flexion 4 4  Hip extension    Hip abduction    Hip adduction  Hip internal rotation    Hip external rotation    Knee flexion 4+ 4+  Knee extension 4+ 4+  Ankle dorsiflexion 4 4  Ankle plantarflexion    Ankle inversion    Ankle eversion    (Blank rows = not tested)    TRANSFERS: Assistive device utilized: None  Sit to stand: Modified independence Stand to sit: Modified independence *Reports needing UE support to push up to get up from floor  GAIT: Gait pattern: step through pattern and wide BOS Distance walked: 320 ft Assistive device utilized: None Level of assistance: Modified independence  FUNCTIONAL TESTS:   Vitals:  133/88, HR 102 5 times sit to stand: 20.53 sec 72M walk:  12.3 sec (2.67 ft/sec) M-CTSIB  Condition 1: Firm Surface, EO 30 Sec, Mild Sway  Condition 2: Firm Surface, EC 30 Sec, Moderate Sway  Condition 3: Foam Surface, EO 30 Sec, Moderate Sway  Condition 4: Foam Surface, EC 6.6 Sec, Severe Sway      TODAY'S TREATMENT:                                                                                                                              DATE: 07/26/2022    PATIENT EDUCATION: Education details: Eval results, POC Person educated: Patient Education method: Explanation Education comprehension: verbalized understanding  HOME EXERCISE PROGRAM: Not yet initiated  GOALS: Goals reviewed with patient? Yes  SHORT TERM GOALS: Target date: 08/24/2022  Pt will be independent with HEP for improved strength, balance, gait. Baseline: Goal status: IN PROGRESS  2.  Pt will improve 5x sit<>stand to less than or equal to 15 sec to demonstrate improved functional strength and transfer  efficiency. Baseline: 20.53 sec>13.84 sec 08/24/2022 Goal status: MET   LONG TERM GOALS: Target date: 09/21/2022  Pt will be independent with HEP for improved strength, balance, gait. Baseline:  Goal status: IN PROGRESS  2.  Pt will improve 5x sit<>stand to less than or equal to 12.5 sec to demonstrate improved functional strength and transfer efficiency. Baseline: 20.53 sec Goal status:IN PROGRESS  3.  Pt will improve Condition 4 on MCTSIB to 30 sec with moderate or less sway. Baseline:  Goal status: IN PROGRESS  4.  distance to improve to 375 ft for improved gait efficiency and safety. Baseline:  Goal status: IN PROGRESS  5.  FGA score to improve to 22/30 for improved balance, decreased fall risk. Baseline: 16/30 Goal status: IN PROGRESS  6.  Pt will perform floor>stand transfer with minimal to no UE external support, independently. Baseline:  Goal status: IN PROGRESS  ASSESSMENT:  CLINICAL IMPRESSION: Pt notes continued lightheadedness/weakness which he attributes to cardiac issues/medication.  Focus on multisensory static and dynamic balance to improve/facilitate righting reactions to reduce risk for falls.  Tolerated well without need for rest periods during session. Continued sessions to meet POC requirements  OBJECTIVE IMPAIRMENTS: Abnormal gait, decreased activity tolerance, decreased balance, decreased endurance, decreased mobility, difficulty walking,  and decreased strength.   ACTIVITY LIMITATIONS: standing, squatting, stairs, transfers, and locomotion level  PARTICIPATION LIMITATIONS: community activity and occupation  PERSONAL FACTORS: 3+ comorbidities: see PMH  are also affecting patient's functional outcome.   REHAB POTENTIAL: Good  CLINICAL DECISION MAKING: Evolving/moderate complexity  EVALUATION COMPLEXITY: Moderate  PLAN:  PT FREQUENCY: 2x/week  PT DURATION: 8 weeks plus eval  PLANNED INTERVENTIONS: Therapeutic exercises, Therapeutic  activity, Neuromuscular re-education, Balance training, Gait training, Patient/Family education, Self Care, and Stair training  PLAN FOR NEXT SESSION: Work on balance on compliant surfaces.  Progress HEP for strength, balance (compliant surface for home); assess  vitals.   Dion Body, PT 08/29/2022, 7:53 AM  [facility] Outpatient Rehab at [facility] [address] [place], [address] [place], [place] [address] Phone # [phone] Fax # [phone]

## 2022-08-30 ENCOUNTER — Encounter: Payer: Self-pay | Admitting: Internal Medicine

## 2022-08-31 ENCOUNTER — Ambulatory Visit: Payer: Medicare PPO | Admitting: Physical Therapy

## 2022-08-31 ENCOUNTER — Other Ambulatory Visit (HOSPITAL_COMMUNITY): Payer: Self-pay | Admitting: *Deleted

## 2022-08-31 ENCOUNTER — Encounter: Payer: Self-pay | Admitting: Physical Therapy

## 2022-08-31 DIAGNOSIS — I480 Paroxysmal atrial fibrillation: Secondary | ICD-10-CM

## 2022-08-31 MED ORDER — DILTIAZEM HCL ER COATED BEADS 120 MG PO CP24: 120.0000 mg | ORAL_CAPSULE | Freq: Every day | ORAL | 5 refills | Status: DC

## 2022-09-04 ENCOUNTER — Other Ambulatory Visit (HOSPITAL_COMMUNITY): Payer: Self-pay | Admitting: *Deleted

## 2022-09-04 ENCOUNTER — Telehealth: Payer: Self-pay | Admitting: Internal Medicine

## 2022-09-04 MED ORDER — BISOPROLOL FUMARATE 5 MG PO TABS: 2.5000 mg | ORAL_TABLET | Freq: Every day | ORAL | 2 refills | Status: DC

## 2022-09-04 NOTE — Telephone Encounter (Signed)
Pt c/o medication issue:  1. Name of Medication: Metoprolol 50mg   2. How are you currently taking this medication (dosage and times per day)?   3. Are you having a reaction (difficulty breathing--STAT)?   4. What is your medication issue? Pt states he received an email from [facility] (previously one of the pts pharmacies) stating that Dr. Lynnette Caffey has ordered Metoprolol 50mg  for him. He would like to make sure this is not a scam as this is no longer his pharmacy. Please advise.

## 2022-09-04 NOTE — Telephone Encounter (Signed)
Confirmed we have not sent a Rx for metoprolol for patient. This medication was discontinued in July.  [facility] clinic sent bisoprolol to [facility] for patient today.  Patient states issue has been resolved and expressed appreciation for follow-up.

## 2022-09-04 NOTE — Therapy (Signed)
OUTPATIENT PHYSICAL THERAPY NEURO TREATMENT NOTE   Patient Name: Wesley [name] MRN: [id number] DOB:03/31/1945, 77 y.o., male Today's Date: 09/05/2022   PCP:   Shon Hale, MD   REFERRING PROVIDER: Alver Sorrow, NP   END OF SESSION:  PT End of Session - 09/05/22 0840     Visit Number 8    Number of Visits 17    Date for PT Re-Evaluation 09/21/22    Authorization Type [employer]    Authorization Time Period 07/26/2022-09/21/2022    Authorization - Visit Number 8    Authorization - Number of Visits 17    Progress Note Due on Visit 10    PT Start Time 0758    PT Stop Time 0844    PT Time Calculation (min) 46 min    Equipment Utilized During Treatment Gait belt    Activity Tolerance Patient tolerated treatment well    Behavior During Therapy WFL for tasks assessed/performed                 Past Medical History:  Diagnosis Date   Depression    Diverticulosis    Hyperlipidemia    Hypertension    Migraine    OSA on CPAP    mild obstructive sleep apnea with an AHI of 8 and O2 saturations as low as 85%.  On CPAP 10cm H2O   Past Surgical History:  Procedure Laterality Date   CARDIOVERSION N/A 05/24/2021   Procedure: CARDIOVERSION;  Surgeon: Little Ishikawa, MD;  Location: [facility] ENDOSCOPY;  Service: Cardiovascular;  Laterality: N/A;   CARDIOVERSION N/A 07/31/2022   Procedure: CARDIOVERSION;  Surgeon: Thomasene Ripple, DO;  Location: [facility] INVASIVE CV LAB;  Service: Cardiovascular;  Laterality: N/A;   CYSTOSCOPY WITH INSERTION OF UROLIFT N/A 04/28/2019   Procedure: CYSTOSCOPY WITH INSERTION OF UROLIFT;  Surgeon: Jerilee Field, MD;  Location: [facility];  Service: Urology;  Laterality: N/A;   GANGLION CYST EXCISION     PROSTATE BIOPSY N/A 04/28/2019   Procedure: BIOPSY TRANSRECTAL ULTRASONIC PROSTATE (TUBP);  Surgeon: Jerilee Field, MD;  Location: [facility];  Service: Urology;  Laterality: N/A;   Patient Active  Problem List   Diagnosis Date Noted   Hypercoagulable state due to persistent atrial fibrillation (HCC) 08/23/2022   OSA on CPAP 01/03/2022   Persistent atrial fibrillation (HCC)    Diverticulitis of colon with perforation 12/18/2020   Bruxism 05/07/2017   Laryngopharyngeal reflux (LPR) 05/07/2017   Presbycusis of both ears 05/07/2017   Hemorrhoids, external, thrombosed 02/13/2012    ONSET DATE: 07/23/2022 (MD referral)  REFERRING DIAG: R26.81 (ICD-10-CM) - Gait instability   THERAPY DIAG:  Muscle weakness (generalized)  Other abnormalities of gait and mobility  Unsteadiness on feet  Difficulty in walking, not elsewhere classified  Rationale for Evaluation and Treatment: Rehabilitation  SUBJECTIVE:  SUBJECTIVE STATEMENT: Reports that he has been bedridden for  days d/t a HA that he believes is d/t one of his meds. Reports a change in meds today. Wondering if his dizziness is caused by medications.  Pt accompanied by: self  PERTINENT HISTORY: A-fib, scheduled for cardioversion  PAIN:  Are you having pain? No  PRECAUTIONS: Fall and Other: a-fib, cardioversion to be performed 07/31/2022  RED FLAGS: None   WEIGHT BEARING RESTRICTIONS: No  FALLS: Has patient fallen in last 6 months? Yes. Number of falls 2 in past year Usually when I first get up and don't have my balance. Have difficulty going down the stairs.  LIVING ENVIRONMENT: Lives with: lives with their spouse Lives in: House/apartment Stairs:  has second floor, but can live on first level.  Has 8 steps with L handrail Has following equipment at home: None  PLOF: Independent  Enjoys walking in neighborhood, travel.  Retired Optician, dispensing  PATIENT GOALS: To regain balance, strength  OBJECTIVE:    TODAY'S TREATMENT:  09/05/2022 Activity Comments  Vitals  117/91 mmHg, 80bpm  sidestepping onto/off foam Occasional UE support and cues to be mindful of foot placement ; improvement after practice   backwards walking Good stability; significant difficulty with addition of dual task (ball toss). Pt c/o dizziness   Rockerboard static and dynamic balance, ant/pos and M/L More difficulty M/L with tendency to fall L  mini squat 10x  Cueing for form; audible crepitus but no pain   sidestepping red TB 2x1 min Tolerated well   walking on toes/heels  In II bars; posterior LOB with heel walking  Vitals at end of session 102/73 mmHg, 73 bpm; no dizziness, SOB, chest pain. Pt did c/o brief lightheadedness upon standing which resolved with water break        PATIENT EDUCATION: Education details: answered pt's questions on sets and reps of gentle strengthening exercises at [facility] - advised to follow cardiologist's recommendations on HR and exercise and to discontinue if cardiac symptoms occur Person educated: Patient Education method: Explanation Education comprehension: verbalized understanding    Access Code: [email or web address] URL: [email or web address] Date: 08/08/2022 Prepared by: [facility] - Outpatient  Rehab - [facility]  Exercises - Romberg Stance with Head Rotation  - 1 x daily - 7 x weekly - 1-2 sets - 10 reps - Romberg Stance with Head Nods  - 1 x daily - 7 x weekly - 1-2 sets - 10 reps - Romberg Stance with Eyes Closed  - 1 x daily - 7 x weekly - 1 sets - 3 reps - 30 sec hold - Backward Walking with Counter Support  - 1 x daily - 7 x weekly - 1 sets - 3-5 reps (forward/back walk) - Tandem Walking with Counter Support  - 1 x daily - 7 x weekly - 1 sets - 3-5 reps-(tandem march) - Sit to Stand with Arm Reach and Jump (sit to stand then ankle PF)  - 1 x daily - 7 x weekly - 3 sets - 10 reps ---------------------------------------------------------------------- Objective measure below taken at  time of initial eval:  DIAGNOSTIC FINDINGS: NA for this episode  COGNITION: Overall cognitive status: Within functional limits for tasks assessed   SENSATION: WFL  POSTURE: rounded shoulders and forward head  LOWER EXTREMITY ROM:   A/ROM BLEs  LOWER EXTREMITY MMT:    MMT Right Eval Left Eval  Hip flexion 4 4  Hip extension    Hip abduction    Hip adduction    Hip  internal rotation    Hip external rotation    Knee flexion 4+ 4+  Knee extension 4+ 4+  Ankle dorsiflexion 4 4  Ankle plantarflexion    Ankle inversion    Ankle eversion    (Blank rows = not tested)    TRANSFERS: Assistive device utilized: None  Sit to stand: Modified independence Stand to sit: Modified independence *Reports needing UE support to push up to get up from floor  GAIT: Gait pattern: step through pattern and wide BOS Distance walked: 320 ft Assistive device utilized: None Level of assistance: Modified independence  FUNCTIONAL TESTS:   Vitals:  133/88, HR 102 5 times sit to stand: 20.53 sec 61M walk:  12.3 sec (2.67 ft/sec) M-CTSIB  Condition 1: Firm Surface, EO 30 Sec, Mild Sway  Condition 2: Firm Surface, EC 30 Sec, Moderate Sway  Condition 3: Foam Surface, EO 30 Sec, Moderate Sway  Condition 4: Foam Surface, EC 6.6 Sec, Severe Sway      TODAY'S TREATMENT:                                                                                                                              DATE: 07/26/2022    PATIENT EDUCATION: Education details: Eval results, POC Person educated: Patient Education method: Explanation Education comprehension: verbalized understanding  HOME EXERCISE PROGRAM: Not yet initiated  GOALS: Goals reviewed with patient? Yes  SHORT TERM GOALS: Target date: 08/24/2022  Pt will be independent with HEP for improved strength, balance, gait. Baseline: Goal status: IN PROGRESS  2.  Pt will improve 5x sit<>stand to less than or equal to 15 sec to demonstrate  improved functional strength and transfer efficiency. Baseline: 20.53 sec>13.84 sec 08/24/2022 Goal status: MET   LONG TERM GOALS: Target date: 09/21/2022  Pt will be independent with HEP for improved strength, balance, gait. Baseline:  Goal status: IN PROGRESS  2.  Pt will improve 5x sit<>stand to less than or equal to 12.5 sec to demonstrate improved functional strength and transfer efficiency. Baseline: 20.53 sec Goal status:IN PROGRESS  3.  Pt will improve Condition 4 on MCTSIB to 30 sec with moderate or less sway. Baseline:  Goal status: IN PROGRESS  4.  distance to improve to 375 ft for improved gait efficiency and safety. Baseline:  Goal status: IN PROGRESS  5.  FGA score to improve to 22/30 for improved balance, decreased fall risk. Baseline: 16/30 Goal status: IN PROGRESS  6.  Pt will perform floor>stand transfer with minimal to no UE external support, independently. Baseline:  Goal status: IN PROGRESS  ASSESSMENT:  CLINICAL IMPRESSION: Patient arrived to session with report of recovering from a HA for the past 2 days which is since resolved. Vitals at start of session WFL, thus proceeded with session. Patient required cues for safe foot placement and demonstrated difficulty with introduction of dual tasking activity. Patient tolerated gentle squatting tasks despite audible crepitus. Use of ankle and hip strategy  was limited with compliant surface training and patient with tendency to fall to L. patient did report mild dizziness occasionally throughout session. Vitals at end of session revealed decreased BP. Patient was provided sitting water break to allow sx to settle before leaving.   OBJECTIVE IMPAIRMENTS: Abnormal gait, decreased activity tolerance, decreased balance, decreased endurance, decreased mobility, difficulty walking, and decreased strength.   ACTIVITY LIMITATIONS: standing, squatting, stairs, transfers, and locomotion level  PARTICIPATION  LIMITATIONS: community activity and occupation  PERSONAL FACTORS: 3+ comorbidities: see PMH  are also affecting patient's functional outcome.   REHAB POTENTIAL: Good  CLINICAL DECISION MAKING: Evolving/moderate complexity  EVALUATION COMPLEXITY: Moderate  PLAN:  PT FREQUENCY: 2x/week  PT DURATION: 8 weeks plus eval  PLANNED INTERVENTIONS: Therapeutic exercises, Therapeutic activity, Neuromuscular re-education, Balance training, Gait training, Patient/Family education, Self Care, and Stair training  PLAN FOR NEXT SESSION: Work on balance on compliant surfaces.  Progress HEP for strength, balance (compliant surface for home); assess  vitals.  Anette Guarneri, PT, DPT 09/05/22 8:47 AM  [facility] at [facility] [address] [place], [address] [place], [place] [address] Phone # [phone] Fax # [phone]

## 2022-09-05 ENCOUNTER — Encounter: Payer: Self-pay | Admitting: Physical Therapy

## 2022-09-05 ENCOUNTER — Ambulatory Visit: Payer: Medicare PPO | Attending: Family | Admitting: Physical Therapy

## 2022-09-05 DIAGNOSIS — R2681 Unsteadiness on feet: Secondary | ICD-10-CM | POA: Insufficient documentation

## 2022-09-05 DIAGNOSIS — R2689 Other abnormalities of gait and mobility: Secondary | ICD-10-CM | POA: Insufficient documentation

## 2022-09-05 DIAGNOSIS — M6281 Muscle weakness (generalized): Secondary | ICD-10-CM | POA: Insufficient documentation

## 2022-09-05 DIAGNOSIS — R262 Difficulty in walking, not elsewhere classified: Secondary | ICD-10-CM | POA: Diagnosis not present

## 2022-09-06 ENCOUNTER — Other Ambulatory Visit: Payer: Self-pay | Admitting: Internal Medicine

## 2022-09-06 ENCOUNTER — Encounter: Payer: Self-pay | Admitting: Cardiology

## 2022-09-06 DIAGNOSIS — I4819 Other persistent atrial fibrillation: Secondary | ICD-10-CM

## 2022-09-06 NOTE — Therapy (Signed)
OUTPATIENT PHYSICAL THERAPY NEURO TREATMENT NOTE   Patient Name: Wesley [name] MRN: [id number] DOB:21-Jun-1945, 77 y.o., male Today's Date: 09/07/2022   PCP:   Shon Hale, MD   REFERRING PROVIDER: Alver Sorrow, NP   END OF SESSION:  PT End of Session - 09/07/22 0842     Visit Number 9    Number of Visits 17    Date for PT Re-Evaluation 09/21/22    Authorization Type [employer]    Authorization Time Period 07/26/2022-09/21/2022    Authorization - Visit Number 9    Authorization - Number of Visits 17    Progress Note Due on Visit 10    PT Start Time 0759    PT Stop Time 0840    PT Time Calculation (min) 41 min    Equipment Utilized During Treatment Gait belt    Activity Tolerance Patient tolerated treatment well    Behavior During Therapy WFL for tasks assessed/performed                  Past Medical History:  Diagnosis Date   Depression    Diverticulosis    Hyperlipidemia    Hypertension    Migraine    OSA on CPAP    mild obstructive sleep apnea with an AHI of 8 and O2 saturations as low as 85%.  On CPAP 10cm H2O   Past Surgical History:  Procedure Laterality Date   CARDIOVERSION N/A 05/24/2021   Procedure: CARDIOVERSION;  Surgeon: Little Ishikawa, MD;  Location: [facility] ENDOSCOPY;  Service: Cardiovascular;  Laterality: N/A;   CARDIOVERSION N/A 07/31/2022   Procedure: CARDIOVERSION;  Surgeon: Thomasene Ripple, DO;  Location: [facility] INVASIVE CV LAB;  Service: Cardiovascular;  Laterality: N/A;   CYSTOSCOPY WITH INSERTION OF UROLIFT N/A 04/28/2019   Procedure: CYSTOSCOPY WITH INSERTION OF UROLIFT;  Surgeon: Jerilee Field, MD;  Location: [facility];  Service: Urology;  Laterality: N/A;   GANGLION CYST EXCISION     PROSTATE BIOPSY N/A 04/28/2019   Procedure: BIOPSY TRANSRECTAL ULTRASONIC PROSTATE (TUBP);  Surgeon: Jerilee Field, MD;  Location: [facility];  Service: Urology;  Laterality: N/A;   Patient  Active Problem List   Diagnosis Date Noted   Hypercoagulable state due to persistent atrial fibrillation (HCC) 08/23/2022   OSA on CPAP 01/03/2022   Persistent atrial fibrillation (HCC)    Diverticulitis of colon with perforation 12/18/2020   Bruxism 05/07/2017   Laryngopharyngeal reflux (LPR) 05/07/2017   Presbycusis of both ears 05/07/2017   Hemorrhoids, external, thrombosed 02/13/2012    ONSET DATE: 07/23/2022 (MD referral)  REFERRING DIAG: R26.81 (ICD-10-CM) - Gait instability   THERAPY DIAG:  Muscle weakness (generalized)  Other abnormalities of gait and mobility  Unsteadiness on feet  Rationale for Evaluation and Treatment: Rehabilitation  SUBJECTIVE:  SUBJECTIVE STATEMENT: "I'm okay. No headache- that's great." Patient reports that he is not able to directly send a message to his cardiology providers or this PT on MyChart as they are not listed.  Pt accompanied by: self  PERTINENT HISTORY: A-fib, scheduled for cardioversion  PAIN:  Are you having pain? No  PRECAUTIONS: Fall and Other: a-fib, cardioversion to be performed 07/31/2022  RED FLAGS: None   WEIGHT BEARING RESTRICTIONS: No  FALLS: Has patient fallen in last 6 months? Yes. Number of falls 2 in past year Usually when I first get up and don't have my balance. Have difficulty going down the stairs.  LIVING ENVIRONMENT: Lives with: lives with their spouse Lives in: House/apartment Stairs:  has second floor, but can live on first level.  Has 8 steps with L handrail Has following equipment at home: None  PLOF: Independent  Enjoys walking in neighborhood, travel.  Retired Optician, dispensing  PATIENT GOALS: To regain balance, strength  OBJECTIVE:     TODAY'S TREATMENT: 09/07/22 Activity Comments  Vitals at start of session   122/96 mmHg, 86bpm   fwd/back stepping  Weaned to no UE support; more imbalance with L LE stabilizing   sidestepping over hurdles Required light UE support but able to wean with practice; 2nd foot catching on hurdle initially   figure 8 turns + ball toss More instability with R turns  Backwards walking + ball toss  Improved continuity of stepping compared to previous session   shallow wall squats  Audible crepitus, proceeded with mini squats instead   alt step ups 6"  CGA-min A  alt toe tap on step, single and double tap Good stability   tandem balance 30" each LE No UE; mild sway           Access Code: [email or web address] URL: [email or web address] Date: 08/08/2022 Prepared by: [facility] - Outpatient  Rehab - [facility]  Exercises - Romberg Stance with Head Rotation  - 1 x daily - 7 x weekly - 1-2 sets - 10 reps - Romberg Stance with Head Nods  - 1 x daily - 7 x weekly - 1-2 sets - 10 reps - Romberg Stance with Eyes Closed  - 1 x daily - 7 x weekly - 1 sets - 3 reps - 30 sec hold - Backward Walking with Counter Support  - 1 x daily - 7 x weekly - 1 sets - 3-5 reps (forward/back walk) - Tandem Walking with Counter Support  - 1 x daily - 7 x weekly - 1 sets - 3-5 reps-(tandem march) - Sit to Stand with Arm Reach and Jump (sit to stand then ankle PF)  - 1 x daily - 7 x weekly - 3 sets - 10 reps  ---------------------------------------------------------------------- Objective measure below taken at time of initial eval:  DIAGNOSTIC FINDINGS: NA for this episode  COGNITION: Overall cognitive status: Within functional limits for tasks assessed   SENSATION: WFL  POSTURE: rounded shoulders and forward head  LOWER EXTREMITY ROM:   A/ROM BLEs  LOWER EXTREMITY MMT:    MMT Right Eval Left Eval  Hip flexion 4 4  Hip extension    Hip abduction    Hip adduction    Hip internal rotation    Hip external rotation    Knee flexion 4+ 4+  Knee extension 4+ 4+  Ankle  dorsiflexion 4 4  Ankle plantarflexion    Ankle inversion    Ankle eversion    (Blank rows = not tested)  TRANSFERS: Assistive device utilized: None  Sit to stand: Modified independence Stand to sit: Modified independence *Reports needing UE support to push up to get up from floor  GAIT: Gait pattern: step through pattern and wide BOS Distance walked: 320 ft Assistive device utilized: None Level of assistance: Modified independence  FUNCTIONAL TESTS:   Vitals:  133/88, HR 102 5 times sit to stand: 20.53 sec 35M walk:  12.3 sec (2.67 ft/sec) M-CTSIB  Condition 1: Firm Surface, EO 30 Sec, Mild Sway  Condition 2: Firm Surface, EC 30 Sec, Moderate Sway  Condition 3: Foam Surface, EO 30 Sec, Moderate Sway  Condition 4: Foam Surface, EC 6.6 Sec, Severe Sway      TODAY'S TREATMENT:                                                                                                                              DATE: 07/26/2022    PATIENT EDUCATION: Education details: Eval results, POC Person educated: Patient Education method: Explanation Education comprehension: verbalized understanding  HOME EXERCISE PROGRAM: Not yet initiated  GOALS: Goals reviewed with patient? Yes  SHORT TERM GOALS: Target date: 08/24/2022  Pt will be independent with HEP for improved strength, balance, gait. Baseline: Goal status: IN PROGRESS  2.  Pt will improve 5x sit<>stand to less than or equal to 15 sec to demonstrate improved functional strength and transfer efficiency. Baseline: 20.53 sec>13.84 sec 08/24/2022 Goal status: MET   LONG TERM GOALS: Target date: 09/21/2022  Pt will be independent with HEP for improved strength, balance, gait. Baseline:  Goal status: IN PROGRESS  2.  Pt will improve 5x sit<>stand to less than or equal to 12.5 sec to demonstrate improved functional strength and transfer efficiency. Baseline: 20.53 sec Goal status:IN PROGRESS  3.  Pt will improve Condition  4 on MCTSIB to 30 sec with moderate or less sway. Baseline:  Goal status: IN PROGRESS  4.  distance to improve to 375 ft for improved gait efficiency and safety. Baseline:  Goal status: IN PROGRESS  5.  FGA score to improve to 22/30 for improved balance, decreased fall risk. Baseline: 16/30 Goal status: IN PROGRESS  6.  Pt will perform floor>stand transfer with minimal to no UE external support, independently. Baseline:  Goal status: IN PROGRESS  ASSESSMENT:  CLINICAL IMPRESSION: Patient arrived to session without complaints. Worked on progression of balance activities today. Patient demonstrated improved ability to maintain continuity of stepping with dual tasking activities today and with no dizziness compared to previous session. Squatting activities today revealed more significant crepitus, thus proceeded with mini squats rather than wall squats which were better-tolerated. Overall patient tolerated all activities better than previous session. No complaints upon leaving.   OBJECTIVE IMPAIRMENTS: Abnormal gait, decreased activity tolerance, decreased balance, decreased endurance, decreased mobility, difficulty walking, and decreased strength.   ACTIVITY LIMITATIONS: standing, squatting, stairs, transfers, and locomotion level  PARTICIPATION LIMITATIONS: community activity and occupation  PERSONAL FACTORS: 3+  comorbidities: see PMH  are also affecting patient's functional outcome.   REHAB POTENTIAL: Good  CLINICAL DECISION MAKING: Evolving/moderate complexity  EVALUATION COMPLEXITY: Moderate  PLAN:  PT FREQUENCY: 2x/week  PT DURATION: 8 weeks plus eval  PLANNED INTERVENTIONS: Therapeutic exercises, Therapeutic activity, Neuromuscular re-education, Balance training, Gait training, Patient/Family education, Self Care, and Stair training  PLAN FOR NEXT SESSION: PN' Work on balance on compliant surfaces.  Progress HEP for strength, balance (compliant surface for home);  assess  vitals.  Anette Guarneri, PT, DPT 09/07/22 8:42 AM  [facility] at [facility] [address] [place], [address] [place], [place] [address] Phone # [phone] Fax # [phone]

## 2022-09-06 NOTE — Telephone Encounter (Signed)
Prescription refill request for Eliquis received. Indication: Afib  Last office visit: 08/23/22 Nelva Bush)  Scr: 1.39 (07/23/22)  Age: 77 Weight: 83.6kg  Appropriate dose. Refill sent.

## 2022-09-07 ENCOUNTER — Ambulatory Visit: Payer: Medicare PPO | Admitting: Physical Therapy

## 2022-09-07 ENCOUNTER — Encounter: Payer: Self-pay | Admitting: Physical Therapy

## 2022-09-07 ENCOUNTER — Ambulatory Visit: Payer: Medicare PPO | Admitting: Cardiology

## 2022-09-07 DIAGNOSIS — R2689 Other abnormalities of gait and mobility: Secondary | ICD-10-CM | POA: Diagnosis not present

## 2022-09-07 DIAGNOSIS — M6281 Muscle weakness (generalized): Secondary | ICD-10-CM | POA: Diagnosis not present

## 2022-09-07 DIAGNOSIS — R2681 Unsteadiness on feet: Secondary | ICD-10-CM | POA: Diagnosis not present

## 2022-09-07 DIAGNOSIS — R262 Difficulty in walking, not elsewhere classified: Secondary | ICD-10-CM | POA: Diagnosis not present

## 2022-09-12 ENCOUNTER — Encounter: Payer: Self-pay | Admitting: Physical Therapy

## 2022-09-12 ENCOUNTER — Ambulatory Visit: Payer: Medicare PPO | Admitting: Physical Therapy

## 2022-09-12 ENCOUNTER — Encounter (HOSPITAL_COMMUNITY): Payer: Self-pay

## 2022-09-12 DIAGNOSIS — R2681 Unsteadiness on feet: Secondary | ICD-10-CM | POA: Diagnosis not present

## 2022-09-12 DIAGNOSIS — R262 Difficulty in walking, not elsewhere classified: Secondary | ICD-10-CM | POA: Diagnosis not present

## 2022-09-12 DIAGNOSIS — M6281 Muscle weakness (generalized): Secondary | ICD-10-CM | POA: Diagnosis not present

## 2022-09-12 DIAGNOSIS — R2689 Other abnormalities of gait and mobility: Secondary | ICD-10-CM

## 2022-09-12 NOTE — Therapy (Signed)
OUTPATIENT PHYSICAL THERAPY NEURO TREATMENT NOTE/PROGRESS NOTE   Patient Name: Wesley [name] MRN: [id number] DOB:1945/01/22, 77 y.o., male Today's Date: 09/12/2022   PCP:   Shon Hale, MD   REFERRING PROVIDER: Alver Sorrow, NP   Progress Note Reporting Period 07/26/2022 to 09/12/2022  See note below for Objective Data and Assessment of Progress/Goals.      END OF SESSION:  PT End of Session - 09/12/22 0754     Visit Number 10    Number of Visits 17    Date for PT Re-Evaluation 09/21/22    Authorization Type [employer]    Authorization Time Period 07/26/2022-09/21/2022    Authorization - Visit Number 10    Authorization - Number of Visits 17    Progress Note Due on Visit 10    PT Start Time 0803    PT Stop Time 0846    PT Time Calculation (min) 43 min    Equipment Utilized During Treatment Gait belt    Activity Tolerance Patient tolerated treatment well    Behavior During Therapy WFL for tasks assessed/performed                  Past Medical History:  Diagnosis Date   Depression    Diverticulosis    Hyperlipidemia    Hypertension    Migraine    OSA on CPAP    mild obstructive sleep apnea with an AHI of 8 and O2 saturations as low as 85%.  On CPAP 10cm H2O   Past Surgical History:  Procedure Laterality Date   CARDIOVERSION N/A 05/24/2021   Procedure: CARDIOVERSION;  Surgeon: Little Ishikawa, MD;  Location: [facility] ENDOSCOPY;  Service: Cardiovascular;  Laterality: N/A;   CARDIOVERSION N/A 07/31/2022   Procedure: CARDIOVERSION;  Surgeon: Thomasene Ripple, DO;  Location: [facility] INVASIVE CV LAB;  Service: Cardiovascular;  Laterality: N/A;   CYSTOSCOPY WITH INSERTION OF UROLIFT N/A 04/28/2019   Procedure: CYSTOSCOPY WITH INSERTION OF UROLIFT;  Surgeon: Jerilee Field, MD;  Location: [facility];  Service: Urology;  Laterality: N/A;   GANGLION CYST EXCISION     PROSTATE BIOPSY N/A 04/28/2019   Procedure: BIOPSY TRANSRECTAL  ULTRASONIC PROSTATE (TUBP);  Surgeon: Jerilee Field, MD;  Location: [facility];  Service: Urology;  Laterality: N/A;   Patient Active Problem List   Diagnosis Date Noted   Hypercoagulable state due to persistent atrial fibrillation (HCC) 08/23/2022   OSA on CPAP 01/03/2022   Persistent atrial fibrillation (HCC)    Diverticulitis of colon with perforation 12/18/2020   Bruxism 05/07/2017   Laryngopharyngeal reflux (LPR) 05/07/2017   Presbycusis of both ears 05/07/2017   Hemorrhoids, external, thrombosed 02/13/2012    ONSET DATE: 07/23/2022 (MD referral)  REFERRING DIAG: R26.81 (ICD-10-CM) - Gait instability   THERAPY DIAG:  Muscle weakness (generalized)  Other abnormalities of gait and mobility  Unsteadiness on feet  Rationale for Evaluation and Treatment: Rehabilitation  SUBJECTIVE:  SUBJECTIVE STATEMENT: Not having a headache.  Feel like some of the meds may make me dizzy when I first get up.  No falls, no close calls.  I think I may go with the Multaq  drug for the a-fib instead of the hospitalization route. Pt accompanied by: self  PERTINENT HISTORY: A-fib, scheduled for cardioversion  PAIN:  Are you having pain? No  PRECAUTIONS: Fall and Other: a-fib, cardioversion to be performed 07/31/2022  RED FLAGS: None   WEIGHT BEARING RESTRICTIONS: No  FALLS: Has patient fallen in last 6 months? Yes. Number of falls 2 in past year Usually when I first get up and don't have my balance. Have difficulty going down the stairs.  LIVING ENVIRONMENT: Lives with: lives with their spouse Lives in: House/apartment Stairs:  has second floor, but can live on first level.  Has 8 steps with L handrail Has following equipment at home: None  PLOF: Independent  Enjoys walking in  neighborhood, travel.  Retired Optician, dispensing  PATIENT GOALS: To regain balance, strength  OBJECTIVE:     TODAY'S TREATMENT: 09/12/2022 Activity Comments  Vitals 133/100 HR 66 bpm; recheck after several minutes:  126/93   FTSTS:  13.34 sec   :  400 ft   FGA:  23/30   Seated vitals:  128/96, HR 75 Standing vitals:  137/97        M-CTSIB  Condition 1: Firm Surface, EO 30 Sec, Normal Sway  Condition 2: Firm Surface, EC 30 Sec, Normal Sway  Condition 3: Foam Surface, EO 30 Sec, Normal Sway  Condition 4: Foam Surface, EC 30 Sec, Mild Sway     [facility] PT Assessment - 09/12/22 0839       Functional Gait  Assessment   Gait assessed  Yes    Gait Level Surface Walks 20 ft, slow speed, abnormal gait pattern, evidence for imbalance or deviates 10-15 in outside of the 12 in walkway width. Requires more than 7 sec to ambulate 20 ft.    Change in Gait Speed Able to smoothly change walking speed without loss of balance or gait deviation. Deviate no more than 6 in outside of the 12 in walkway width.    Gait with Horizontal Head Turns Performs head turns smoothly with no change in gait. Deviates no more than 6 in outside 12 in walkway width    Gait with Vertical Head Turns Performs head turns with no change in gait. Deviates no more than 6 in outside 12 in walkway width.    Gait and Pivot Turn Pivot turns safely within 3 sec and stops quickly with no loss of balance.    Step Over Obstacle Is able to step over one shoe box (4.5 in total height) without changing gait speed. No evidence of imbalance.    Gait with Narrow Base of Support Is able to ambulate for 10 steps heel to toe with no staggering.    Gait with Eyes Closed Walks 20 ft, slow speed, abnormal gait pattern, evidence for imbalance, deviates 10-15 in outside 12 in walkway width. Requires more than 9 sec to ambulate 20 ft.   12.03 sec, veers to L   Ambulating Backwards Walks 20 ft, uses assistive device, slower speed, mild gait deviations,  deviates 6-10 in outside 12 in walkway width.   13.66   Steps Alternating feet, must use rail.    Total Score 23    FGA comment: improved from 16/30  PATIENT EDUCATION: Education details: talked about safety with transitional movements; slowed transitions, gentle exercises for ankle/lower extremities, staying well hydrated (potential for blood pressure changes upon standing (?)); discussed progress towards goals Person educated: Patient Education method: Explanation Education comprehension: verbalized understanding   Access Code: [email or web address] URL: [email or web address] Date: 08/08/2022 Prepared by: [facility] - Outpatient  Rehab - [facility]  Exercises - Romberg Stance with Head Rotation  - 1 x daily - 7 x weekly - 1-2 sets - 10 reps - Romberg Stance with Head Nods  - 1 x daily - 7 x weekly - 1-2 sets - 10 reps - Romberg Stance with Eyes Closed  - 1 x daily - 7 x weekly - 1 sets - 3 reps - 30 sec hold - Backward Walking with Counter Support  - 1 x daily - 7 x weekly - 1 sets - 3-5 reps (forward/back walk) - Tandem Walking with Counter Support  - 1 x daily - 7 x weekly - 1 sets - 3-5 reps-(tandem march) - Sit to Stand with Arm Reach and Jump (sit to stand then ankle PF)  - 1 x daily - 7 x weekly - 3 sets - 10 reps  ---------------------------------------------------------------------- Objective measure below taken at time of initial eval:  DIAGNOSTIC FINDINGS: NA for this episode  COGNITION: Overall cognitive status: Within functional limits for tasks assessed   SENSATION: WFL  POSTURE: rounded shoulders and forward head  LOWER EXTREMITY ROM:   A/ROM BLEs  LOWER EXTREMITY MMT:    MMT Right Eval Left Eval  Hip flexion 4 4  Hip extension    Hip abduction    Hip adduction    Hip internal rotation    Hip external rotation    Knee flexion 4+ 4+  Knee extension 4+ 4+  Ankle dorsiflexion 4 4  Ankle plantarflexion    Ankle inversion     Ankle eversion    (Blank rows = not tested)    TRANSFERS: Assistive device utilized: None  Sit to stand: Modified independence Stand to sit: Modified independence *Reports needing UE support to push up to get up from floor  GAIT: Gait pattern: step through pattern and wide BOS Distance walked: 320 ft Assistive device utilized: None Level of assistance: Modified independence  FUNCTIONAL TESTS:   Vitals:  133/88, HR 102 5 times sit to stand: 20.53 sec 79M walk:  12.3 sec (2.67 ft/sec) M-CTSIB  Condition 1: Firm Surface, EO 30 Sec, Mild Sway  Condition 2: Firm Surface, EC 30 Sec, Moderate Sway  Condition 3: Foam Surface, EO 30 Sec, Moderate Sway  Condition 4: Foam Surface, EC 6.6 Sec, Severe Sway      TODAY'S TREATMENT:                                                                                                                              DATE: 07/26/2022    PATIENT EDUCATION: Education details: Eval results, POC Person educated:  Patient Education method: Explanation Education comprehension: verbalized understanding  HOME EXERCISE PROGRAM: Not yet initiated  GOALS: Goals reviewed with patient? Yes  SHORT TERM GOALS: Target date: 08/24/2022  Pt will be independent with HEP for improved strength, balance, gait. Baseline: Goal status: IN PROGRESS  2.  Pt will improve 5x sit<>stand to less than or equal to 15 sec to demonstrate improved functional strength and transfer efficiency. Baseline: 20.53 sec>13.84 sec 08/24/2022 Goal status: MET   LONG TERM GOALS: Target date: 09/21/2022  Pt will be independent with HEP for improved strength, balance, gait. Baseline:  Goal status: IN PROGRESS  2.  Pt will improve 5x sit<>stand to less than or equal to 12.5 sec to demonstrate improved functional strength and transfer efficiency. Baseline: 20.53 sec>13.34 sec 09/12/2022 Goal status:IN PROGRESS  3.  Pt will improve Condition 4 on MCTSIB to 30 sec with moderate or  less sway. Baseline: min sway 09/12/2022 Goal status: MET 09/12/2022  4.  distance to improve to 375 ft for improved gait efficiency and safety. Baseline: 400 ft Goal status: GOAL MET, 09/12/2022  5.  FGA score to improve to 22/30 for improved balance, decreased fall risk. Baseline: 16/30>23/30 09/12/2022 Goal status: MET 09/12/2022  6.  Pt will perform floor>stand transfer with minimal to no UE external support, independently. Baseline:  Goal status: IN PROGRESS  ASSESSMENT:  CLINICAL IMPRESSION: 10th Visit Progress note:  Pt reports improved balance overall and feeling better since PT.  Skilled PT session today focused on looking at objective measures.  FTSTS 13.34 sec, FGA 23/30 improved from 16/30.  400 ft.  He has met LTG 3, 4, 5.  LTG 2 in progress for FTSTS, with improvement noted, just not to goal level.  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  He will continue to benefit from skilled PT towards remaining goals in current POC, with further discussion on progression of POC.  OBJECTIVE IMPAIRMENTS: Abnormal gait, decreased activity tolerance, decreased balance, decreased endurance, decreased mobility, difficulty walking, and decreased strength.   ACTIVITY LIMITATIONS: standing, squatting, stairs, transfers, and locomotion level  PARTICIPATION LIMITATIONS: community activity and occupation  PERSONAL FACTORS: 3+ comorbidities: see PMH  are also affecting patient's functional outcome.   REHAB POTENTIAL: Good  CLINICAL DECISION MAKING: Evolving/moderate complexity  EVALUATION COMPLEXITY: Moderate  PLAN:  PT FREQUENCY: 2x/week  PT DURATION: 8 weeks plus eval  PLANNED INTERVENTIONS: Therapeutic exercises, Therapeutic activity, Neuromuscular re-education, Balance training, Gait training, Patient/Family education, Self Care, and  Stair training  PLAN FOR NEXT SESSION: Work on floor to stand transfers.  Work on balance on compliant surfaces.  Progress HEP for strength, balance (compliant surface for home); assess  vitals.*Discuss POC and possibility of decreasing frequency of PT/transition to [facility] or cardiac rehab  Lonia Blood, PT 09/12/22 8:49 AM Phone: [phone] Fax: [phone]   [facility] at [facility] [address] [place], [address] [place], [place] [address] Phone # [phone] Fax # [phone]

## 2022-09-13 DIAGNOSIS — G4733 Obstructive sleep apnea (adult) (pediatric): Secondary | ICD-10-CM | POA: Diagnosis not present

## 2022-09-13 DIAGNOSIS — R0683 Snoring: Secondary | ICD-10-CM | POA: Diagnosis not present

## 2022-09-13 DIAGNOSIS — I10 Essential (primary) hypertension: Secondary | ICD-10-CM | POA: Diagnosis not present

## 2022-09-14 ENCOUNTER — Ambulatory Visit: Payer: Medicare PPO

## 2022-09-14 DIAGNOSIS — R2689 Other abnormalities of gait and mobility: Secondary | ICD-10-CM

## 2022-09-14 DIAGNOSIS — R262 Difficulty in walking, not elsewhere classified: Secondary | ICD-10-CM | POA: Diagnosis not present

## 2022-09-14 DIAGNOSIS — M6281 Muscle weakness (generalized): Secondary | ICD-10-CM | POA: Diagnosis not present

## 2022-09-14 DIAGNOSIS — R2681 Unsteadiness on feet: Secondary | ICD-10-CM | POA: Diagnosis not present

## 2022-09-14 NOTE — Therapy (Signed)
OUTPATIENT PHYSICAL THERAPY NEURO TREATMENT NOTE   Patient Name: Wesley [name] MRN: [id number] DOB:16-Feb-1945, 77 y.o., male Today's Date: 09/14/2022   PCP:   Shon Hale, MD   REFERRING PROVIDER: Alver Sorrow, NP   END OF SESSION:  PT End of Session - 09/14/22 0757     Visit Number 11    Number of Visits 17    Date for PT Re-Evaluation 09/21/22    Authorization Type [employer]    Authorization Time Period 07/26/2022-09/21/2022    Authorization - Visit Number 11    Authorization - Number of Visits 17    Progress Note Due on Visit 10    PT Start Time 0800    PT Stop Time 0845    PT Time Calculation (min) 45 min    Equipment Utilized During Treatment Gait belt    Activity Tolerance Patient tolerated treatment well    Behavior During Therapy WFL for tasks assessed/performed                  Past Medical History:  Diagnosis Date   Depression    Diverticulosis    Hyperlipidemia    Hypertension    Migraine    OSA on CPAP    mild obstructive sleep apnea with an AHI of 8 and O2 saturations as low as 85%.  On CPAP 10cm H2O   Past Surgical History:  Procedure Laterality Date   CARDIOVERSION N/A 05/24/2021   Procedure: CARDIOVERSION;  Surgeon: Little Ishikawa, MD;  Location: [facility] ENDOSCOPY;  Service: Cardiovascular;  Laterality: N/A;   CARDIOVERSION N/A 07/31/2022   Procedure: CARDIOVERSION;  Surgeon: Thomasene Ripple, DO;  Location: [facility] INVASIVE CV LAB;  Service: Cardiovascular;  Laterality: N/A;   CYSTOSCOPY WITH INSERTION OF UROLIFT N/A 04/28/2019   Procedure: CYSTOSCOPY WITH INSERTION OF UROLIFT;  Surgeon: Jerilee Field, MD;  Location: [facility];  Service: Urology;  Laterality: N/A;   GANGLION CYST EXCISION     PROSTATE BIOPSY N/A 04/28/2019   Procedure: BIOPSY TRANSRECTAL ULTRASONIC PROSTATE (TUBP);  Surgeon: Jerilee Field, MD;  Location: [facility];  Service: Urology;  Laterality: N/A;   Patient  Active Problem List   Diagnosis Date Noted   Hypercoagulable state due to persistent atrial fibrillation (HCC) 08/23/2022   OSA on CPAP 01/03/2022   Persistent atrial fibrillation (HCC)    Diverticulitis of colon with perforation 12/18/2020   Bruxism 05/07/2017   Laryngopharyngeal reflux (LPR) 05/07/2017   Presbycusis of both ears 05/07/2017   Hemorrhoids, external, thrombosed 02/13/2012    ONSET DATE: 07/23/2022 (MD referral)  REFERRING DIAG: R26.81 (ICD-10-CM) - Gait instability   THERAPY DIAG:  Muscle weakness (generalized)  Other abnormalities of gait and mobility  Unsteadiness on feet  Difficulty in walking, not elsewhere classified  Balance problem  Rationale for Evaluation and Treatment: Rehabilitation  SUBJECTIVE:  SUBJECTIVE STATEMENT: Not too bad on dizziness yesterday with prolonged standing. Had issue on Wednesday with feeling generalized weakness and unable to stand during a cooking class.  Pt accompanied by: self  PERTINENT HISTORY: A-fib, scheduled for cardioversion  PAIN:  Are you having pain? No  PRECAUTIONS: Fall and Other: a-fib, cardioversion to be performed 07/31/2022  RED FLAGS: None   WEIGHT BEARING RESTRICTIONS: No  FALLS: Has patient fallen in last 6 months? Yes. Number of falls 2 in past year Usually when I first get up and don't have my balance. Have difficulty going down the stairs.  LIVING ENVIRONMENT: Lives with: lives with their spouse Lives in: House/apartment Stairs:  has second floor, but can live on first level.  Has 8 steps with L handrail Has following equipment at home: None  PLOF: Independent  Enjoys walking in neighborhood, travel.  Retired Optician, dispensing  PATIENT GOALS: To regain balance, strength  OBJECTIVE:   TODAY'S TREATMENT:  09/14/22 Activity Comments  Vitals: 141/91 mmHg, 70 bpm   Tandem stance + bicep curls 4x5 reps 9#   Suitcase carry march 4x25 ft 9#  March with head turns 1x25 ft   Tandem walk forward/retro-walk to start 2x25 ft   Lunge to stand 2x5 reps W/ unilat UE support, more difficulty with support on ipsilat side      TODAY'S TREATMENT: 09/12/2022 Activity Comments  Vitals 133/100 HR 66 bpm; recheck after several minutes:  126/93   FTSTS:  13.34 sec   :  400 ft   FGA:  23/30   Seated vitals:  128/96, HR 75 Standing vitals:  137/97        M-CTSIB  Condition 1: Firm Surface, EO 30 Sec, Normal Sway  Condition 2: Firm Surface, EC 30 Sec, Normal Sway  Condition 3: Foam Surface, EO 30 Sec, Normal Sway  Condition 4: Foam Surface, EC 30 Sec, Mild Sway       PATIENT EDUCATION: Education details: talked about safety with transitional movements; slowed transitions, gentle exercises for ankle/lower extremities, staying well hydrated (potential for blood pressure changes upon standing (?)); discussed progress towards goals Person educated: Patient Education method: Explanation Education comprehension: verbalized understanding   Access Code: [email or web address] URL: [email or web address] Date: 08/08/2022 Prepared by: [facility] - Outpatient  Rehab - [facility]  Exercises - Romberg Stance with Head Rotation  - 1 x daily - 7 x weekly - 1-2 sets - 10 reps - Romberg Stance with Head Nods  - 1 x daily - 7 x weekly - 1-2 sets - 10 reps - Romberg Stance with Eyes Closed  - 1 x daily - 7 x weekly - 1 sets - 3 reps - 30 sec hold - Backward Walking with Counter Support  - 1 x daily - 7 x weekly - 1 sets - 3-5 reps (forward/back walk) - Tandem Walking with Counter Support  - 1 x daily - 7 x weekly - 1 sets - 3-5 reps-(tandem march) - Sit to Stand with Arm Reach and Jump (sit to stand then ankle PF)  - 1 x daily - 7 x weekly - 3 sets - 10  reps  ---------------------------------------------------------------------- Objective measure below taken at time of initial eval:  DIAGNOSTIC FINDINGS: NA for this episode  COGNITION: Overall cognitive status: Within functional limits for tasks assessed   SENSATION: WFL  POSTURE: rounded shoulders and forward head  LOWER EXTREMITY ROM:   A/ROM BLEs  LOWER EXTREMITY MMT:    MMT Right Eval Left Eval  Hip flexion  4 4  Hip extension    Hip abduction    Hip adduction    Hip internal rotation    Hip external rotation    Knee flexion 4+ 4+  Knee extension 4+ 4+  Ankle dorsiflexion 4 4  Ankle plantarflexion    Ankle inversion    Ankle eversion    (Blank rows = not tested)    TRANSFERS: Assistive device utilized: None  Sit to stand: Modified independence Stand to sit: Modified independence *Reports needing UE support to push up to get up from floor  GAIT: Gait pattern: step through pattern and wide BOS Distance walked: 320 ft Assistive device utilized: None Level of assistance: Modified independence  FUNCTIONAL TESTS:   Vitals:  133/88, HR 102 5 times sit to stand: 20.53 sec 25M walk:  12.3 sec (2.67 ft/sec) M-CTSIB  Condition 1: Firm Surface, EO 30 Sec, Mild Sway  Condition 2: Firm Surface, EC 30 Sec, Moderate Sway  Condition 3: Foam Surface, EO 30 Sec, Moderate Sway  Condition 4: Foam Surface, EC 6.6 Sec, Severe Sway      TODAY'S TREATMENT:                                                                                                                              DATE: 07/26/2022    PATIENT EDUCATION: Education details: Eval results, POC Person educated: Patient Education method: Explanation Education comprehension: verbalized understanding  HOME EXERCISE PROGRAM: Not yet initiated  GOALS: Goals reviewed with patient? Yes  SHORT TERM GOALS: Target date: 08/24/2022  Pt will be independent with HEP for improved strength, balance,  gait. Baseline: Goal status: IN PROGRESS  2.  Pt will improve 5x sit<>stand to less than or equal to 15 sec to demonstrate improved functional strength and transfer efficiency. Baseline: 20.53 sec>13.84 sec 08/24/2022 Goal status: MET   LONG TERM GOALS: Target date: 09/21/2022  Pt will be independent with HEP for improved strength, balance, gait. Baseline:  Goal status: IN PROGRESS  2.  Pt will improve 5x sit<>stand to less than or equal to 12.5 sec to demonstrate improved functional strength and transfer efficiency. Baseline: 20.53 sec>13.34 sec 09/12/2022 Goal status:IN PROGRESS  3.  Pt will improve Condition 4 on MCTSIB to 30 sec with moderate or less sway. Baseline: min sway 09/12/2022 Goal status: MET 09/12/2022  4.  distance to improve to 375 ft for improved gait efficiency and safety. Baseline: 400 ft Goal status: GOAL MET, 09/12/2022  5.  FGA score to improve to 22/30 for improved balance, decreased fall risk. Baseline: 16/30>23/30 09/12/2022 Goal status: MET 09/12/2022  6.  Pt will perform floor>stand transfer with minimal to no UE external support, independently. Baseline:  Goal status: IN PROGRESS  ASSESSMENT:  CLINICAL IMPRESSION: Emphasis on static balance activities and withstanding postural perturbations and general conditioning. Dynamic balance activities to improve stability and safety with narrow BOS and movement coupled with head turns requiring CGA due to unsteadiness.  Difficulty with arising from lunge to stand due to LE weakness requiring UE support. Continued sessions to proess POC details and improve safety with mobility  OBJECTIVE IMPAIRMENTS: Abnormal gait, decreased activity tolerance, decreased balance, decreased endurance, decreased mobility, difficulty walking, and decreased strength.   ACTIVITY LIMITATIONS: standing, squatting, stairs, transfers, and locomotion level  PARTICIPATION LIMITATIONS: community activity and occupation  PERSONAL  FACTORS: 3+ comorbidities: see PMH  are also affecting patient's functional outcome.   REHAB POTENTIAL: Good  CLINICAL DECISION MAKING: Evolving/moderate complexity  EVALUATION COMPLEXITY: Moderate  PLAN:  PT FREQUENCY: 2x/week  PT DURATION: 8 weeks plus eval  PLANNED INTERVENTIONS: Therapeutic exercises, Therapeutic activity, Neuromuscular re-education, Balance training, Gait training, Patient/Family education, Self Care, and Stair training  PLAN FOR NEXT SESSION: Work on floor to stand transfers.  Work on balance on compliant surfaces.  Progress HEP for strength, balance (compliant surface for home); assess  vitals.*Discuss POC and possibility of decreasing frequency of PT/transition to [facility] or cardiac rehab  8:03 AM, 09/14/22 M. Shary Decamp, PT, DPT Physical Therapist- [place] Office Number: [phone]

## 2022-09-18 ENCOUNTER — Other Ambulatory Visit (HOSPITAL_COMMUNITY): Payer: Self-pay | Admitting: *Deleted

## 2022-09-18 DIAGNOSIS — I4819 Other persistent atrial fibrillation: Secondary | ICD-10-CM

## 2022-09-18 MED ORDER — MULTAQ 400 MG PO TABS: 400.0000 mg | ORAL_TABLET | Freq: Two times a day (BID) | ORAL | 3 refills | Status: DC

## 2022-09-19 ENCOUNTER — Ambulatory Visit: Payer: Medicare PPO

## 2022-09-19 DIAGNOSIS — R2681 Unsteadiness on feet: Secondary | ICD-10-CM | POA: Diagnosis not present

## 2022-09-19 DIAGNOSIS — R2689 Other abnormalities of gait and mobility: Secondary | ICD-10-CM | POA: Diagnosis not present

## 2022-09-19 DIAGNOSIS — R262 Difficulty in walking, not elsewhere classified: Secondary | ICD-10-CM | POA: Diagnosis not present

## 2022-09-19 DIAGNOSIS — M6281 Muscle weakness (generalized): Secondary | ICD-10-CM

## 2022-09-19 NOTE — Therapy (Signed)
OUTPATIENT PHYSICAL THERAPY NEURO TREATMENT NOTE   Patient Name: Wesley [name] MRN: [id number] DOB:03-14-1945, 77 y.o., male Today's Date: 09/19/2022   PCP:   Shon Hale, MD   REFERRING PROVIDER: Alver Sorrow, NP   END OF SESSION:  PT End of Session - 09/19/22 0754     Visit Number 12    Number of Visits 17    Date for PT Re-Evaluation 09/21/22    Authorization Type [employer]    Authorization Time Period 07/26/2022-09/21/2022    Authorization - Visit Number 12    Authorization - Number of Visits 17    Progress Note Due on Visit 10    PT Start Time 0800    PT Stop Time 0845    PT Time Calculation (min) 45 min    Equipment Utilized During Treatment Gait belt    Activity Tolerance Patient tolerated treatment well    Behavior During Therapy WFL for tasks assessed/performed                  Past Medical History:  Diagnosis Date   Depression    Diverticulosis    Hyperlipidemia    Hypertension    Migraine    OSA on CPAP    mild obstructive sleep apnea with an AHI of 8 and O2 saturations as low as 85%.  On CPAP 10cm H2O   Past Surgical History:  Procedure Laterality Date   CARDIOVERSION N/A 05/24/2021   Procedure: CARDIOVERSION;  Surgeon: Little Ishikawa, MD;  Location: [facility] ENDOSCOPY;  Service: Cardiovascular;  Laterality: N/A;   CARDIOVERSION N/A 07/31/2022   Procedure: CARDIOVERSION;  Surgeon: Thomasene Ripple, DO;  Location: [facility] INVASIVE CV LAB;  Service: Cardiovascular;  Laterality: N/A;   CYSTOSCOPY WITH INSERTION OF UROLIFT N/A 04/28/2019   Procedure: CYSTOSCOPY WITH INSERTION OF UROLIFT;  Surgeon: Jerilee Field, MD;  Location: [facility];  Service: Urology;  Laterality: N/A;   GANGLION CYST EXCISION     PROSTATE BIOPSY N/A 04/28/2019   Procedure: BIOPSY TRANSRECTAL ULTRASONIC PROSTATE (TUBP);  Surgeon: Jerilee Field, MD;  Location: [facility];  Service: Urology;  Laterality: N/A;   Patient  Active Problem List   Diagnosis Date Noted   Hypercoagulable state due to persistent atrial fibrillation (HCC) 08/23/2022   OSA on CPAP 01/03/2022   Persistent atrial fibrillation (HCC)    Diverticulitis of colon with perforation 12/18/2020   Bruxism 05/07/2017   Laryngopharyngeal reflux (LPR) 05/07/2017   Presbycusis of both ears 05/07/2017   Hemorrhoids, external, thrombosed 02/13/2012    ONSET DATE: 07/23/2022 (MD referral)  REFERRING DIAG: R26.81 (ICD-10-CM) - Gait instability   THERAPY DIAG:  Muscle weakness (generalized)  Other abnormalities of gait and mobility  Unsteadiness on feet  Difficulty in walking, not elsewhere classified  Balance problem  Rationale for Evaluation and Treatment: Rehabilitation  SUBJECTIVE:  SUBJECTIVE STATEMENT: Getting lined pu for the cardioversion and new Rx regimen. Hips were sore from last session Pt accompanied by: self  PERTINENT HISTORY: A-fib, scheduled for cardioversion  PAIN:  Are you having pain? No  PRECAUTIONS: Fall and Other: a-fib, cardioversion to be performed 07/31/2022  RED FLAGS: None   WEIGHT BEARING RESTRICTIONS: No  FALLS: Has patient fallen in last 6 months? Yes. Number of falls 2 in past year Usually when I first get up and don't have my balance. Have difficulty going down the stairs.  LIVING ENVIRONMENT: Lives with: lives with their spouse Lives in: House/apartment Stairs:  has second floor, but can live on first level.  Has 8 steps with L handrail Has following equipment at home: None  PLOF: Independent  Enjoys walking in neighborhood, travel.  Retired Optician, dispensing  PATIENT GOALS: To regain balance, strength  OBJECTIVE:   TODAY'S TREATMENT: 09/19/22 Activity Comments  128/92 mmHg, 74 bpm   Dynamic balance warm-up  -retrowalk 4x40 ft -sidestep 4x40 ft  Resisted walking 2x2 min 15#  Alt step up w/ opposite reach 1x10   128/95 mmHg, 84 bpm            M-CTSIB  Condition 1: Firm Surface, EO 30 Sec, Normal Sway  Condition 2: Firm Surface, EC 30 Sec, Normal Sway  Condition 3: Foam Surface, EO 30 Sec, Normal Sway  Condition 4: Foam Surface, EC 30 Sec, Mild Sway       PATIENT EDUCATION: Education details: talked about safety with transitional movements; slowed transitions, gentle exercises for ankle/lower extremities, staying well hydrated (potential for blood pressure changes upon standing (?)); discussed progress towards goals Person educated: Patient Education method: Explanation Education comprehension: verbalized understanding   Access Code: [email or web address] URL: [email or web address] Date: 08/08/2022 Prepared by: [facility] - Outpatient  Rehab - [facility]  Exercises - Romberg Stance with Head Rotation  - 1 x daily - 7 x weekly - 1-2 sets - 10 reps - Romberg Stance with Head Nods  - 1 x daily - 7 x weekly - 1-2 sets - 10 reps - Romberg Stance with Eyes Closed  - 1 x daily - 7 x weekly - 1 sets - 3 reps - 30 sec hold - Backward Walking with Counter Support  - 1 x daily - 7 x weekly - 1 sets - 3-5 reps (forward/back walk) - Tandem Walking with Counter Support  - 1 x daily - 7 x weekly - 1 sets - 3-5 reps-(tandem march) - Sit to Stand with Arm Reach and Jump (sit to stand then ankle PF)  - 1 x daily - 7 x weekly - 3 sets - 10 reps  ---------------------------------------------------------------------- Objective measure below taken at time of initial eval:  DIAGNOSTIC FINDINGS: NA for this episode  COGNITION: Overall cognitive status: Within functional limits for tasks assessed   SENSATION: WFL  POSTURE: rounded shoulders and forward head  LOWER EXTREMITY ROM:   A/ROM BLEs  LOWER EXTREMITY MMT:    MMT Right Eval Left Eval  Hip flexion 4 4  Hip extension    Hip  abduction    Hip adduction    Hip internal rotation    Hip external rotation    Knee flexion 4+ 4+  Knee extension 4+ 4+  Ankle dorsiflexion 4 4  Ankle plantarflexion    Ankle inversion    Ankle eversion    (Blank rows = not tested)    TRANSFERS: Assistive device utilized: None  Sit to stand: Modified independence  Stand to sit: Modified independence *Reports needing UE support to push up to get up from floor  GAIT: Gait pattern: step through pattern and wide BOS Distance walked: 320 ft Assistive device utilized: None Level of assistance: Modified independence  FUNCTIONAL TESTS:   Vitals:  133/88, HR 102 5 times sit to stand: 20.53 sec 32M walk:  12.3 sec (2.67 ft/sec) M-CTSIB  Condition 1: Firm Surface, EO 30 Sec, Mild Sway  Condition 2: Firm Surface, EC 30 Sec, Moderate Sway  Condition 3: Foam Surface, EO 30 Sec, Moderate Sway  Condition 4: Foam Surface, EC 6.6 Sec, Severe Sway         PATIENT EDUCATION: Education details: Eval results, POC Person educated: Patient Education method: Explanation Education comprehension: verbalized understanding  HOME EXERCISE PROGRAM: Not yet initiated  GOALS: Goals reviewed with patient? Yes  SHORT TERM GOALS: Target date: 08/24/2022  Pt will be independent with HEP for improved strength, balance, gait. Baseline: Goal status: IN PROGRESS  2.  Pt will improve 5x sit<>stand to less than or equal to 15 sec to demonstrate improved functional strength and transfer efficiency. Baseline: 20.53 sec>13.84 sec 08/24/2022 Goal status: MET   LONG TERM GOALS: Target date: 09/21/2022  Pt will be independent with HEP for improved strength, balance, gait. Baseline:  Goal status: IN PROGRESS  2.  Pt will improve 5x sit<>stand to less than or equal to 12.5 sec to demonstrate improved functional strength and transfer efficiency. Baseline: 20.53 sec>13.34 sec 09/12/2022 Goal status:IN PROGRESS  3.  Pt will improve Condition 4 on  MCTSIB to 30 sec with moderate or less sway. Baseline: min sway 09/12/2022 Goal status: MET 09/12/2022  4.  distance to improve to 375 ft for improved gait efficiency and safety. Baseline: 400 ft Goal status: GOAL MET, 09/12/2022  5.  FGA score to improve to 22/30 for improved balance, decreased fall risk. Baseline: 16/30>23/30 09/12/2022 Goal status: MET 09/12/2022  6.  Pt will perform floor>stand transfer with minimal to no UE external support, independently. Baseline:  Goal status: IN PROGRESS  ASSESSMENT:  CLINICAL IMPRESSION: Tolerating session well with instances of unsteadiness and requiring CGA-min A for support during dynamic standing activities that require single limb support.  Good activity tolerance throughout with need for 3 therapeutic rest periods during session but without adverse response.  Will perform re-assessment at next session and anticipate D/C to HEP/community program  OBJECTIVE IMPAIRMENTS: Abnormal gait, decreased activity tolerance, decreased balance, decreased endurance, decreased mobility, difficulty walking, and decreased strength.   ACTIVITY LIMITATIONS: standing, squatting, stairs, transfers, and locomotion level  PARTICIPATION LIMITATIONS: community activity and occupation  PERSONAL FACTORS: 3+ comorbidities: see PMH  are also affecting patient's functional outcome.   REHAB POTENTIAL: Good  CLINICAL DECISION MAKING: Evolving/moderate complexity  EVALUATION COMPLEXITY: Moderate  PLAN:  PT FREQUENCY: 2x/week  PT DURATION: 8 weeks plus eval  PLANNED INTERVENTIONS: Therapeutic exercises, Therapeutic activity, Neuromuscular re-education, Balance training, Gait training, Patient/Family education, Self Care, and Stair training  PLAN FOR NEXT SESSION: D/C assessment  7:55 AM, 09/19/22 M. Shary Decamp, PT, DPT Physical Therapist- [place] Office Number: [phone]

## 2022-09-20 NOTE — Therapy (Signed)
OUTPATIENT PHYSICAL THERAPY NEURO DISCHARGE   Patient Name: Wesley [name] MRN: [id number] DOB:April 11, 1945, 77 y.o., male Today's Date: 09/21/2022   PCP:   Shon Hale, MD   REFERRING PROVIDER: Alver Sorrow, NP    Progress Note Reporting Period 09/14/22 to 09/21/22  See note below for Objective Data and Assessment of Progress/Goals.       END OF SESSION:  PT End of Session - 09/21/22 0821     Visit Number 13    Number of Visits 17    Date for PT Re-Evaluation 09/21/22    Authorization Type [employer]    Authorization Time Period 07/26/2022-09/21/2022    Authorization - Visit Number 13    Authorization - Number of Visits 17    Progress Note Due on Visit 10    PT Start Time 0759    PT Stop Time 0822    PT Time Calculation (min) 23 min    Equipment Utilized During Treatment Gait belt    Activity Tolerance Patient tolerated treatment well    Behavior During Therapy WFL for tasks assessed/performed                   Past Medical History:  Diagnosis Date   Depression    Diverticulosis    Hyperlipidemia    Hypertension    Migraine    OSA on CPAP    mild obstructive sleep apnea with an AHI of 8 and O2 saturations as low as 85%.  On CPAP 10cm H2O   Past Surgical History:  Procedure Laterality Date   CARDIOVERSION N/A 05/24/2021   Procedure: CARDIOVERSION;  Surgeon: Little Ishikawa, MD;  Location: [facility] ENDOSCOPY;  Service: Cardiovascular;  Laterality: N/A;   CARDIOVERSION N/A 07/31/2022   Procedure: CARDIOVERSION;  Surgeon: Thomasene Ripple, DO;  Location: [facility] INVASIVE CV LAB;  Service: Cardiovascular;  Laterality: N/A;   CYSTOSCOPY WITH INSERTION OF UROLIFT N/A 04/28/2019   Procedure: CYSTOSCOPY WITH INSERTION OF UROLIFT;  Surgeon: Jerilee Field, MD;  Location: [facility];  Service: Urology;  Laterality: N/A;   GANGLION CYST EXCISION     PROSTATE BIOPSY N/A 04/28/2019   Procedure: BIOPSY TRANSRECTAL ULTRASONIC  PROSTATE (TUBP);  Surgeon: Jerilee Field, MD;  Location: [facility];  Service: Urology;  Laterality: N/A;   Patient Active Problem List   Diagnosis Date Noted   Hypercoagulable state due to persistent atrial fibrillation (HCC) 08/23/2022   OSA on CPAP 01/03/2022   Persistent atrial fibrillation (HCC)    Diverticulitis of colon with perforation 12/18/2020   Bruxism 05/07/2017   Laryngopharyngeal reflux (LPR) 05/07/2017   Presbycusis of both ears 05/07/2017   Hemorrhoids, external, thrombosed 02/13/2012    ONSET DATE: 07/23/2022 (MD referral)  REFERRING DIAG: R26.81 (ICD-10-CM) - Gait instability   THERAPY DIAG:  Muscle weakness (generalized)  Other abnormalities of gait and mobility  Unsteadiness on feet  Difficulty in walking, not elsewhere classified  Rationale for Evaluation and Treatment: Rehabilitation  SUBJECTIVE:  SUBJECTIVE STATEMENT: Went to see [employer] and sat for 2 hours- having a back problem from this. However, was able to navigate through the [place] [facility] without LOB.   Pt accompanied by: self  PERTINENT HISTORY: A-fib, scheduled for cardioversion  PAIN:  Are you having pain? No (no LBP in sitting currently)  PRECAUTIONS: Fall and Other: a-fib, cardioversion to be performed 07/31/2022  RED FLAGS: None   WEIGHT BEARING RESTRICTIONS: No  FALLS: Has patient fallen in last 6 months? Yes. Number of falls 2 in past year Usually when I first get up and don't have my balance. Have difficulty going down the stairs.  LIVING ENVIRONMENT: Lives with: lives with their spouse Lives in: House/apartment Stairs:  has second floor, but can live on first level.  Has 8 steps with L handrail Has following equipment at home: None  PLOF: Independent  Enjoys walking  in neighborhood, travel.  Retired Optician, dispensing  PATIENT GOALS: To regain balance, strength  OBJECTIVE:     TODAY'S TREATMENT: 09/21/22 Activity Comments  5xSTS 13.89 sec without UEs   Floor>stand Trialed tall kneeling on airex with chair in front and low mat behind- required mod A; better success with 1/2 kneeling on airex, pushing off mat to stand with supervision.                 HOME EXERCISE PROGRAM Last updated: 09/21/22 Access Code: [email or web address] URL: [email or web address] Date: 09/21/2022 Prepared by: [facility] - Outpatient  Rehab - [facility]  Exercises - Romberg Stance with Head Rotation  - 1 x daily - 7 x weekly - 1-2 sets - 10 reps - Romberg Stance with Head Nods  - 1 x daily - 7 x weekly - 1-2 sets - 10 reps - Romberg Stance with Eyes Closed  - 1 x daily - 7 x weekly - 1 sets - 3 reps - 30 sec hold - Backward Walking with Counter Support  - 1 x daily - 7 x weekly - 1 sets - 3-5 reps - Tandem Walking with Counter Support  - 1 x daily - 7 x weekly - 1 sets - 3-5 reps - Sit to Stand with Arm Reach and Jump  - 1 x daily - 7 x weekly - 3 sets - 10 reps - Full Leg Press  - 1 x daily - 5 x weekly - 2 sets - 10 reps - Single Leg Knee Extension with Weight Machine  - 1 x daily - 5 x weekly - 2 sets - 10 reps - Hamstring Curl with Weight Machine  - 1 x daily - 5 x weekly - 2 sets - 10 reps - Hip Adduction Machine  - 1 x daily - 5 x weekly - 2 sets - 10 reps - Hip Adduction Machine  - 1 x daily - 5 x weekly - 2 sets - 10 reps   PATIENT EDUCATION: Education details: edu on progress towards goals and remaining impairments, review/re-printed handout of examples of machine strengthening; encouraged to start at low weight/higher reps and to begin with a personal trainer to assess for best exercise intensity in light of cardiac issues  Person educated: Patient Education method: Explanation, Demonstration, Tactile cues, Verbal cues, and Handouts Education comprehension:  verbalized understanding and returned demonstration   ---------------------------------------------------------------------- Objective measure below taken at time of initial eval:  DIAGNOSTIC FINDINGS: NA for this episode  COGNITION: Overall cognitive status: Within functional limits for tasks assessed   SENSATION: WFL  POSTURE: rounded shoulders and forward  head  LOWER EXTREMITY ROM:   A/ROM BLEs  LOWER EXTREMITY MMT:    MMT Right Eval Left Eval  Hip flexion 4 4  Hip extension    Hip abduction    Hip adduction    Hip internal rotation    Hip external rotation    Knee flexion 4+ 4+  Knee extension 4+ 4+  Ankle dorsiflexion 4 4  Ankle plantarflexion    Ankle inversion    Ankle eversion    (Blank rows = not tested)    TRANSFERS: Assistive device utilized: None  Sit to stand: Modified independence Stand to sit: Modified independence *Reports needing UE support to push up to get up from floor  GAIT: Gait pattern: step through pattern and wide BOS Distance walked: 320 ft Assistive device utilized: None Level of assistance: Modified independence  FUNCTIONAL TESTS:   Vitals:  133/88, HR 102 5 times sit to stand: 20.53 sec 92M walk:  12.3 sec (2.67 ft/sec) M-CTSIB  Condition 1: Firm Surface, EO 30 Sec, Mild Sway  Condition 2: Firm Surface, EC 30 Sec, Moderate Sway  Condition 3: Foam Surface, EO 30 Sec, Moderate Sway  Condition 4: Foam Surface, EC 6.6 Sec, Severe Sway         PATIENT EDUCATION: Education details: Eval results, POC Person educated: Patient Education method: Explanation Education comprehension: verbalized understanding  HOME EXERCISE PROGRAM: Not yet initiated  GOALS: Goals reviewed with patient? Yes  SHORT TERM GOALS: Target date: 08/24/2022  Pt will be independent with HEP for improved strength, balance, gait. Baseline: Goal status: MET 09/21/22  2.  Pt will improve 5x sit<>stand to less than or equal to 15 sec to demonstrate  improved functional strength and transfer efficiency. Baseline: 20.53 sec>13.84 sec 08/24/2022 Goal status: MET   LONG TERM GOALS: Target date: 09/21/2022  Pt will be independent with HEP for improved strength, balance, gait. Baseline:  Goal status: MET 09/21/22  2.  Pt will improve 5x sit<>stand to less than or equal to 12.5 sec to demonstrate improved functional strength and transfer efficiency. Baseline: 20.53 sec>13.34 sec 09/12/2022; 13.89 sec without UEs 09/21/22 Goal status: PARTIALLY MET 09/21/22  3.  Pt will improve Condition 4 on MCTSIB to 30 sec with moderate or less sway. Baseline: min sway 09/12/2022 Goal status: MET 09/12/2022  4.  distance to improve to 375 ft for improved gait efficiency and safety. Baseline: 400 ft Goal status: GOAL MET, 09/12/2022  5.  FGA score to improve to 22/30 for improved balance, decreased fall risk. Baseline: 16/30>23/30 09/12/2022 Goal status: MET 09/12/2022  6.  Pt will perform floor>stand transfer with minimal to no UE external support, independently. Baseline: able to perform from 1/2 kneel to stand with mat support 09/21/22 Goal status: PARTIALLY MET MET 09/21/22  ASSESSMENT:  CLINICAL IMPRESSION: Patient arrived to session with report of some LBP this AM after period of prolonged sitting yesterday. Reports improved stability while navigating through the theater, however. Patient is able to perform 5xSTS in decreased time, not quite meeting goal. Able to complete  kneel to stand with external support. However, patient reports that he has been allowed to stand at church now instead of kneeling. Educated patient on continued fitness activities and smooth transition from PT to personal training at [employer] as he already has a Research scientist (physical sciences). Patient is now ready for DC.   OBJECTIVE IMPAIRMENTS: Abnormal gait, decreased activity tolerance, decreased balance, decreased endurance, decreased mobility, difficulty walking, and decreased strength.    ACTIVITY LIMITATIONS: standing,  squatting, stairs, transfers, and locomotion level  PARTICIPATION LIMITATIONS: community activity and occupation  PERSONAL FACTORS: 3+ comorbidities: see PMH  are also affecting patient's functional outcome.   REHAB POTENTIAL: Good  CLINICAL DECISION MAKING: Evolving/moderate complexity  EVALUATION COMPLEXITY: Moderate  PLAN:  PT FREQUENCY: 2x/week  PT DURATION: 8 weeks plus eval  PLANNED INTERVENTIONS: Therapeutic exercises, Therapeutic activity, Neuromuscular re-education, Balance training, Gait training, Patient/Family education, Self Care, and Stair training  PLAN FOR NEXT SESSION: D/C at this time    PHYSICAL THERAPY DISCHARGE SUMMARY  Visits from Start of Care: 13  Current functional level related to goals / functional outcomes: See above clinical impression    Remaining deficits: Increased time for transfers, difficulty with floor transfers   Education / Equipment: HEP, edu on safe gym transition  Plan: Patient agrees to discharge.  Patient goals were partially met. Patient is being discharged due to meeting the stated rehab goals.        Anette Guarneri, PT, DPT 09/21/22 8:29 AM  [facility] [address] [place], [address] [place], [place] [address] Phone # [phone] Fax # [phone]

## 2022-09-21 ENCOUNTER — Ambulatory Visit: Payer: Medicare PPO | Admitting: Physical Therapy

## 2022-09-21 ENCOUNTER — Encounter: Payer: Self-pay | Admitting: Physical Therapy

## 2022-09-21 DIAGNOSIS — R262 Difficulty in walking, not elsewhere classified: Secondary | ICD-10-CM

## 2022-09-21 DIAGNOSIS — R2681 Unsteadiness on feet: Secondary | ICD-10-CM

## 2022-09-21 DIAGNOSIS — M6281 Muscle weakness (generalized): Secondary | ICD-10-CM

## 2022-09-21 DIAGNOSIS — R2689 Other abnormalities of gait and mobility: Secondary | ICD-10-CM

## 2022-09-30 NOTE — Progress Notes (Unsigned)
Electrophysiology Office Note:   Date:  10/01/2022  ID:  [name], [name] [birth year], MRN [id number]  Primary Cardiologist: Orbie Pyo, MD Electrophysiologist: [name], MD      History of Present Illness:   Wesley [name] is a 77 y.o. male with h/o HTN, HLD, aortic atherosclerosis by CT imaging, OSA, and prostate cancer seen today for  for Electrophysiology evaluation of atrial fibrillation at the request of Dr. Lynnette Caffey.  Patient was first diagnosed with atrial fibrillation in April 2023.  He was started on metoprolol and apixaban.  He was referred for sleep study which did show sleep apnea.  He underwent successful cardioversion to normal sinus rhythm in May 2023.  He then developed recurrent atrial fibrillation in July 2024 again requiring cardioversion.  Unfortunately, he only remained in normal sinus rhythm for about 1 week at this time before going back into atrial fibrillation. His primary symptoms are fatigue and exertional intolerance.  He recently attended his son's wedding and felt like he needed to sit and rest, which is not usual for him.  He does report feeling a noticeable improvement in the symptoms when in sinus rhythm.   Review of systems complete and found to be negative unless listed in HPI.   EP Information / Studies Reviewed:    EKG is ordered today. Personal review as below.  EKG Interpretation Date/Time:  Monday October 01 2022 08:18:04 EDT Ventricular Rate:  97 PR Interval:    QRS Duration:  86 QT Interval:  344 QTC Calculation: 436 R Axis:   8  Text Interpretation: Atrial fibrillation Nonspecific ST abnormality When compared with ECG of 23-Aug-2022 11:06, No significant change was found Confirmed by [name] [phone]) on 10/01/2022 8:22:13 AM  08/23/22 EKG:    Echo 06/05/2022: Normal LV function, LVEF 60 to 65%. Normal RV function and size. Mild to moderately enlarged left atrium. No significant valvular disease mentioned.  Risk  Assessment/Calculations:    CHA2DS2-VASc Score = 4   This indicates a 4.8% annual risk of stroke. The patient's score is based upon: CHF History: 0 HTN History: 1 Diabetes History: 0 Stroke History: 0 Vascular Disease History: 1 Age Score: 2 Gender Score: 0             Physical Exam:   VS:  BP 118/78 (BP Location: Left Arm, Patient Position: Sitting, Cuff Size: Normal)   Pulse 84   Resp 16   Ht 5\' 11"  (1.803 m)   Wt 182 lb (82.6 kg)   SpO2 98%   BMI 25.38 kg/m    Wt Readings from Last 3 Encounters:  10/01/22 182 lb (82.6 kg)  08/23/22 184 lb 6.4 oz (83.6 kg)  08/14/22 185 lb (83.9 kg)     GEN: Well nourished, well developed in no acute distress NECK: No JVD; No carotid bruits CARDIAC: Irregularly irregular rate and rhythm, no murmurs, rubs, gallops RESPIRATORY:  Clear to auscultation without rales, wheezing or rhonchi  ABDOMEN: Soft, non-tender, non-distended EXTREMITIES:  No edema; No deformity   ASSESSMENT AND PLAN:   Wesley [name] is a 77 y.o. male with h/o HTN, HLD, aortic atherosclerosis by CT imaging, OSA, and prostate cancer seen today for  for Electrophysiology evaluation of atrial fibrillation at the request of Dr. Lynnette Caffey.  He is symptomatic.  He underwent cardioversion in July with early recurrence of atrial fibrillation within 1 week.  #.  Persistent atrial fibrillation, symptomatic: -He is scheduled already scheduled for a cardioversion on 10/08/2022.  -Dronedarone  had been discussed with the patient in A-fib clinic and he was planning to start on the day of his cardioversion.  Given that it will take 4 to 8 days to reach a steady state/therapeutic level, we will start this medication today in hopes of improving her chances of a successful cardioversion.  He has been on his Eliquis for greater than 30 days without any missed doses. -Discussed treatment options today for AF including long-term antiarrhythmic drug therapy and ablation. Discussed risks,  recovery and likelihood of success with each treatment strategy. Risk, benefits, and alternatives to EP study and ablation for afib were discussed. These risks include but are not limited to stroke, bleeding, vascular damage, tamponade, perforation, damage to the esophagus, lungs, phrenic nerve and other structures, pulmonary vein stenosis, worsening renal function, coronary vasospasm and death.  Discussed potential need for repeat procedures and antiarrhythmic drugs after an initial ablation. The patient understands these risk and wishes to proceed.  We will therefore proceed with catheter ablation at the next available time.  Carto, ICE, anesthesia are requested for the procedure.  Will also obtain CT PV protocol prior to the procedure to exclude LAA thrombus and further evaluate atrial anatomy.  Given his history of iodine allergy (hives and swelling), we will need to pretreat him with steroids.  #.  Secondary hypercoagulable state due to atrial fibrillation: -He has a CHA2DS2-VASc of 4 -Currently taking Eliquis with no issue.  He will continue this.  #.  Struct of sleep apnea: Confirmed on sleep study. -He has previously been intermittently compliant with CPAP.  He states that he takes this off at night without knowing.  There is also an anxiety component.  I encouraged CPAP use and will refer to one of my colleagues to assist with sleep medicine.  #.  Hypertension: -At goal today.  Reinforced importance of adequate blood pressure control and prevention of atrial fibrillation. Continue current regimen and close follow-up with PCP.   Total time of encounter: 79 minutes total time of encounter, including face-to-face patient care, coordination of care and counseling regarding high complexity medical decision making.   Signed, [name], MD

## 2022-09-30 NOTE — H&P (View-Only) (Signed)
Electrophysiology Office Note:   Date:  10/01/2022  ID:  Wesley, [name] [birth year], MRN [id number]  Primary Cardiologist: Orbie Pyo, MD Electrophysiologist: [name], MD      History of Present Illness:   Wesley [name] is a 77 y.o. male with h/o HTN, HLD, aortic atherosclerosis by CT imaging, OSA, and prostate cancer seen today for  for Electrophysiology evaluation of atrial fibrillation at the request of Dr. Lynnette Caffey.  Patient was first diagnosed with atrial fibrillation in April 2023.  He was started on metoprolol and apixaban.  He was referred for sleep study which did show sleep apnea.  He underwent successful cardioversion to normal sinus rhythm in May 2023.  He then developed recurrent atrial fibrillation in July 2024 again requiring cardioversion.  Unfortunately, he only remained in normal sinus rhythm for about 1 week at this time before going back into atrial fibrillation. His primary symptoms are fatigue and exertional intolerance.  He recently attended his son's wedding and felt like he needed to sit and rest, which is not usual for him.  He does report feeling a noticeable improvement in the symptoms when in sinus rhythm.   Review of systems complete and found to be negative unless listed in HPI.   EP Information / Studies Reviewed:    EKG is ordered today. Personal review as below.  EKG Interpretation Date/Time:  Monday October 01 2022 08:18:04 EDT Ventricular Rate:  97 PR Interval:    QRS Duration:  86 QT Interval:  344 QTC Calculation: 436 R Axis:   8  Text Interpretation: Atrial fibrillation Nonspecific ST abnormality When compared with ECG of 23-Aug-2022 11:06, No significant change was found Confirmed by [name] [phone]) on 10/01/2022 8:22:13 AM  08/23/22 EKG:    Echo 06/05/2022: Normal LV function, LVEF 60 to 65%. Normal RV function and size. Mild to moderately enlarged left atrium. No significant valvular disease mentioned.  Risk  Assessment/Calculations:    CHA2DS2-VASc Score = 4   This indicates a 4.8% annual risk of stroke. The patient's score is based upon: CHF History: 0 HTN History: 1 Diabetes History: 0 Stroke History: 0 Vascular Disease History: 1 Age Score: 2 Gender Score: 0             Physical Exam:   VS:  BP 118/78 (BP Location: Left Arm, Patient Position: Sitting, Cuff Size: Normal)   Pulse 84   Resp 16   Ht 5\' 11"  (1.803 m)   Wt 182 lb (82.6 kg)   SpO2 98%   BMI 25.38 kg/m    Wt Readings from Last 3 Encounters:  10/01/22 182 lb (82.6 kg)  08/23/22 184 lb 6.4 oz (83.6 kg)  08/14/22 185 lb (83.9 kg)     GEN: Well nourished, well developed in no acute distress NECK: No JVD; No carotid bruits CARDIAC: Irregularly irregular rate and rhythm, no murmurs, rubs, gallops RESPIRATORY:  Clear to auscultation without rales, wheezing or rhonchi  ABDOMEN: Soft, non-tender, non-distended EXTREMITIES:  No edema; No deformity   ASSESSMENT AND PLAN:   Wesley [name] is a 77 y.o. male with h/o HTN, HLD, aortic atherosclerosis by CT imaging, OSA, and prostate cancer seen today for  for Electrophysiology evaluation of atrial fibrillation at the request of Dr. Lynnette Caffey.  He is symptomatic.  He underwent cardioversion in July with early recurrence of atrial fibrillation within 1 week.  #.  Persistent atrial fibrillation, symptomatic: -He is scheduled already scheduled for a cardioversion on 10/08/2022.  -Dronedarone  had been discussed with the patient in A-fib clinic and he was planning to start on the day of his cardioversion.  Given that it will take 4 to 8 days to reach a steady state/therapeutic level, we will start this medication today in hopes of improving her chances of a successful cardioversion.  He has been on his Eliquis for greater than 30 days without any missed doses. -Discussed treatment options today for AF including long-term antiarrhythmic drug therapy and ablation. Discussed risks,  recovery and likelihood of success with each treatment strategy. Risk, benefits, and alternatives to EP study and ablation for afib were discussed. These risks include but are not limited to stroke, bleeding, vascular damage, tamponade, perforation, damage to the esophagus, lungs, phrenic nerve and other structures, pulmonary vein stenosis, worsening renal function, coronary vasospasm and death.  Discussed potential need for repeat procedures and antiarrhythmic drugs after an initial ablation. The patient understands these risk and wishes to proceed.  We will therefore proceed with catheter ablation at the next available time.  Carto, ICE, anesthesia are requested for the procedure.  Will also obtain CT PV protocol prior to the procedure to exclude LAA thrombus and further evaluate atrial anatomy.  Given his history of iodine allergy (hives and swelling), we will need to pretreat him with steroids.  #.  Secondary hypercoagulable state due to atrial fibrillation: -He has a CHA2DS2-VASc of 4 -Currently taking Eliquis with no issue.  He will continue this.  #.  Struct of sleep apnea: Confirmed on sleep study. -He has previously been intermittently compliant with CPAP.  He states that he takes this off at night without knowing.  There is also an anxiety component.  I encouraged CPAP use and will refer to one of my colleagues to assist with sleep medicine.  #.  Hypertension: -At goal today.  Reinforced importance of adequate blood pressure control and prevention of atrial fibrillation. Continue current regimen and close follow-up with PCP.   Total time of encounter: 79 minutes total time of encounter, including face-to-face patient care, coordination of care and counseling regarding high complexity medical decision making.   Signed, [name], MD

## 2022-10-01 ENCOUNTER — Encounter: Payer: Self-pay | Admitting: Cardiology

## 2022-10-01 ENCOUNTER — Ambulatory Visit: Payer: Medicare PPO | Attending: Cardiology | Admitting: Cardiology

## 2022-10-01 VITALS — BP 118/78 | HR 84 | Resp 16 | Ht 71.0 in | Wt 182.0 lb

## 2022-10-01 DIAGNOSIS — G4733 Obstructive sleep apnea (adult) (pediatric): Secondary | ICD-10-CM | POA: Diagnosis not present

## 2022-10-01 DIAGNOSIS — I4819 Other persistent atrial fibrillation: Secondary | ICD-10-CM

## 2022-10-01 DIAGNOSIS — D6869 Other thrombophilia: Secondary | ICD-10-CM | POA: Diagnosis not present

## 2022-10-01 DIAGNOSIS — I10 Essential (primary) hypertension: Secondary | ICD-10-CM

## 2022-10-01 LAB — CBC

## 2022-10-01 MED ORDER — PREDNISONE 50 MG PO TABS: ORAL_TABLET | ORAL | 0 refills | Status: DC

## 2022-10-01 MED ORDER — DIPHENHYDRAMINE HCL 50 MG PO TABS: 50.0000 mg | ORAL_TABLET | Freq: Once | ORAL | 0 refills | Status: DC

## 2022-10-01 NOTE — Patient Instructions (Addendum)
Medication Instructions:  Your physician recommends that you continue on your current medications as directed. Please refer to the Current Medication list given to you today.  *If you need a refill on your cardiac medications before your next appointment, please call your pharmacy*   Lab Work: TODAY: BMET and CBC If you have labs (blood work) drawn today and your tests are completely normal, you will receive your results only by: [facility] (if you have MyChart) OR A paper copy in the mail If you have any lab test that is abnormal or we need to change your treatment, we will call you to review the results.   Testing/Procedures: Your physician has requested that you have cardiac CT. Cardiac computed tomography (CT) is a painless test that uses an x-ray machine to take clear, detailed pictures of your heart. For further information please visit [facility]. Please follow instruction sheet as given.  Your physician has recommended that you have an ablation. Catheter ablation is a medical procedure used to treat some cardiac arrhythmias (irregular heartbeats). During catheter ablation, a long, thin, flexible tube is put into a blood vessel in your groin (upper thigh), or neck. This tube is called an ablation catheter. It is then guided to your heart through the blood vessel. Radio frequency waves destroy small areas of heart tissue where abnormal heartbeats may cause an arrhythmia to start. Please see the instruction sheet given to you today.   Follow-Up: At [facility], you and your health needs are our priority.  As part of our continuing mission to provide you with exceptional heart care, we have created designated Provider Care Teams.  These Care Teams include your primary Cardiologist (physician) and Advanced Practice Providers (APPs -  Physician Assistants and Nurse Practitioners) who all work together to provide you with the care you need, when you need it.  Your next  appointment:   See instruction letter

## 2022-10-02 LAB — CBC
Hematocrit: 50.8 % (ref 37.5–51.0)
Hemoglobin: 16.4 g/dL (ref 13.0–17.7)
MCH: 30.6 pg (ref 26.6–33.0)
MCHC: 32.3 g/dL (ref 31.5–35.7)
MCV: 95 fL (ref 79–97)
Platelets: 269 10*3/uL (ref 150–450)
RBC: 5.36 x10E6/uL (ref 4.14–5.80)
RDW: 13.2 % (ref 11.6–15.4)
WBC: 6.7 10*3/uL (ref 3.4–10.8)

## 2022-10-02 LAB — BASIC METABOLIC PANEL
BUN/Creatinine Ratio: 12 (ref 10–24)
BUN: 15 mg/dL (ref 8–27)
CO2: 23 mmol/L (ref 20–29)
Calcium: 9.4 mg/dL (ref 8.6–10.2)
Chloride: 104 mmol/L (ref 96–106)
Creatinine, Ser: 1.24 mg/dL (ref 0.76–1.27)
Glucose: 110 mg/dL — ABNORMAL HIGH (ref 70–99)
Potassium: 4.3 mmol/L (ref 3.5–5.2)
Sodium: 141 mmol/L (ref 134–144)
eGFR: 60 mL/min/{1.73_m2} (ref >59)

## 2022-10-05 NOTE — Progress Notes (Signed)
Spoke to patient on phone regarding procedure Monday.  Instructed to arrive at 0845 am, NPO after midnight.  Confirmed patient has a ride home and responsible person to stay with patient for 24 hours after the procedure  Confirmed no missed doses of Eliquis , instructed patient to take Monday morning of surgery with a sip of water.  Take BP/heart meds Monday AM with a sip of water

## 2022-10-08 ENCOUNTER — Ambulatory Visit (HOSPITAL_COMMUNITY)
Admission: RE | Admit: 2022-10-08 | Discharge: 2022-10-08 | Disposition: A | Discharge status: Home / Self Care | Payer: Medicare PPO | Source: Ambulatory Visit | Attending: Cardiology | Admitting: Cardiology

## 2022-10-08 ENCOUNTER — Ambulatory Visit (HOSPITAL_COMMUNITY): Payer: Medicare PPO | Admitting: Certified Registered Nurse Anesthetist

## 2022-10-08 ENCOUNTER — Encounter (HOSPITAL_COMMUNITY)
Admission: RE | Disposition: A | Discharge status: Home / Self Care | Payer: Self-pay | Source: Home / Self Care | Attending: Internal Medicine

## 2022-10-08 ENCOUNTER — Ambulatory Visit (HOSPITAL_COMMUNITY)
Admission: RE | Admit: 2022-10-08 | Discharge: 2022-10-08 | Disposition: A | Discharge status: Home / Self Care | Payer: Medicare PPO | Attending: Internal Medicine | Admitting: Internal Medicine

## 2022-10-08 ENCOUNTER — Other Ambulatory Visit: Payer: Self-pay

## 2022-10-08 DIAGNOSIS — I4819 Other persistent atrial fibrillation: Secondary | ICD-10-CM | POA: Insufficient documentation

## 2022-10-08 SURGERY — INVASIVE LAB ABORTED CASE
Anesthesia: General

## 2022-10-08 MED ORDER — IOHEXOL 350 MG/ML SOLN
95.0000 mL | Freq: Once | INTRAVENOUS | Status: AC | PRN
  Administered 2022-10-08: 95 mL via INTRAVENOUS

## 2022-10-08 SURGICAL SUPPLY — 1 items: PAD DEFIB RADIO PHYSIO CONN (PAD) ×1 IMPLANT

## 2022-10-08 NOTE — Progress Notes (Signed)
12 lead EKG done pre cardioversion; patient is in NSR. Waiting for Dr. Tenny Craw to come.

## 2022-10-09 DIAGNOSIS — E7800 Pure hypercholesterolemia, unspecified: Secondary | ICD-10-CM | POA: Diagnosis not present

## 2022-10-09 DIAGNOSIS — I480 Paroxysmal atrial fibrillation: Secondary | ICD-10-CM | POA: Diagnosis not present

## 2022-10-13 DIAGNOSIS — R0683 Snoring: Secondary | ICD-10-CM | POA: Diagnosis not present

## 2022-10-13 DIAGNOSIS — I10 Essential (primary) hypertension: Secondary | ICD-10-CM | POA: Diagnosis not present

## 2022-10-13 DIAGNOSIS — G4733 Obstructive sleep apnea (adult) (pediatric): Secondary | ICD-10-CM | POA: Diagnosis not present

## 2022-10-16 NOTE — Anesthesia Preprocedure Evaluation (Signed)
Anesthesia Evaluation  Patient identified by MRN, date of birth, ID band Patient awake    Reviewed: Allergy & Precautions, NPO status , Patient's Chart, lab work & pertinent test results  Airway Mallampati: II  TM Distance: >3 FB Neck ROM: Full    Dental no notable dental hx.    Pulmonary sleep apnea    Pulmonary exam normal        Cardiovascular hypertension, Pt. on medications and Pt. on home beta blockers  Rhythm:Regular Rate:Normal     Neuro/Psych  Headaches   Depression       GI/Hepatic negative GI ROS, Neg liver ROS,,,  Endo/Other  negative endocrine ROS    Renal/GU negative Renal ROS     Musculoskeletal negative musculoskeletal ROS (+)    Abdominal Normal abdominal exam  (+)   Peds  Hematology Lab Results      Component                Value               Date                      WBC                      6.7                 10/01/2022                HGB                      16.4                10/01/2022                HCT                      50.8                10/01/2022                MCV                      95                  10/01/2022                PLT                      269                 10/01/2022             Lab Results      Component                Value               Date                      NA                       141                 10/01/2022                K  4.3                 10/01/2022                CO2                      23                  10/01/2022                GLUCOSE                  110 (H)             10/01/2022                BUN                      15                  10/01/2022                CREATININE               1.24                10/01/2022                CALCIUM                  9.4                 10/01/2022                EGFR                     60                  10/01/2022                GFRNONAA                 >60                  12/19/2020              Anesthesia Other Findings   Reproductive/Obstetrics                             Anesthesia Physical Anesthesia Plan  ASA: 3  Anesthesia Plan: General   Post-op Pain Management:    Induction: Intravenous  PONV Risk Score and Plan: 2 and Ondansetron, Dexamethasone and Treatment may vary due to age or medical condition  Airway Management Planned: Mask and Oral ETT  Additional Equipment: None  Intra-op Plan:   Post-operative Plan: Extubation in OR  Informed Consent: I have reviewed the patients History and Physical, chart, labs and discussed the procedure including the risks, benefits and alternatives for the proposed anesthesia with the patient or authorized representative who has indicated his/her understanding and acceptance.     Dental advisory given  Plan Discussed with: CRNA  Anesthesia Plan Comments:        Anesthesia Quick Evaluation

## 2022-10-16 NOTE — Pre-Procedure Instructions (Signed)
Instructed patient on the following items: Arrival time 0600 Nothing to eat or drink after midnight No meds AM of procedure Responsible person to drive you home and stay with you for 24 hrs  Have you missed any doses of anti-coagulant Eliquis- takes twice a day, hasn't missed any doses.  Don't take dose in the morning.

## 2022-10-17 ENCOUNTER — Encounter (HOSPITAL_COMMUNITY)
Admission: RE | Disposition: A | Discharge status: Home / Self Care | Payer: Self-pay | Source: Home / Self Care | Attending: Cardiology

## 2022-10-17 ENCOUNTER — Other Ambulatory Visit: Payer: Self-pay

## 2022-10-17 ENCOUNTER — Ambulatory Visit (HOSPITAL_BASED_OUTPATIENT_CLINIC_OR_DEPARTMENT_OTHER): Payer: Self-pay | Admitting: Anesthesiology

## 2022-10-17 ENCOUNTER — Ambulatory Visit (HOSPITAL_COMMUNITY): Payer: Medicare PPO | Admitting: Anesthesiology

## 2022-10-17 ENCOUNTER — Ambulatory Visit (HOSPITAL_COMMUNITY)
Admission: RE | Admit: 2022-10-17 | Discharge: 2022-10-17 | Disposition: A | Discharge status: Home / Self Care | Payer: Medicare PPO | Attending: Cardiology | Admitting: Cardiology

## 2022-10-17 DIAGNOSIS — G4733 Obstructive sleep apnea (adult) (pediatric): Secondary | ICD-10-CM | POA: Insufficient documentation

## 2022-10-17 DIAGNOSIS — Z7901 Long term (current) use of anticoagulants: Secondary | ICD-10-CM | POA: Diagnosis not present

## 2022-10-17 DIAGNOSIS — I10 Essential (primary) hypertension: Secondary | ICD-10-CM | POA: Diagnosis not present

## 2022-10-17 DIAGNOSIS — I4819 Other persistent atrial fibrillation: Secondary | ICD-10-CM | POA: Insufficient documentation

## 2022-10-17 HISTORY — PX: ATRIAL FIBRILLATION ABLATION: EP1191

## 2022-10-17 SURGERY — ATRIAL FIBRILLATION ABLATION
Anesthesia: General

## 2022-10-17 MED ORDER — ROCURONIUM BROMIDE 10 MG/ML (PF) SYRINGE
PREFILLED_SYRINGE | INTRAVENOUS | Status: DC | PRN
  Administered 2022-10-17: 60 mg via INTRAVENOUS

## 2022-10-17 MED ORDER — PHENYLEPHRINE HCL-NACL 20-0.9 MG/250ML-% IV SOLN
INTRAVENOUS | Status: DC | PRN
  Administered 2022-10-17: 30 ug/min via INTRAVENOUS
  Administered 2022-10-17 (×2): 160 ug via INTRAVENOUS

## 2022-10-17 MED ORDER — ONDANSETRON HCL 4 MG/2ML IJ SOLN: 4.0000 mg | Freq: Four times a day (QID) | INTRAMUSCULAR | Status: DC | PRN

## 2022-10-17 MED ORDER — FENTANYL CITRATE (PF) 100 MCG/2ML IJ SOLN
INTRAMUSCULAR | Status: AC
  Filled 2022-10-17: qty 2

## 2022-10-17 MED ORDER — LIDOCAINE 2% (20 MG/ML) 5 ML SYRINGE
INTRAMUSCULAR | Status: DC | PRN
  Administered 2022-10-17: 100 mg via INTRAVENOUS

## 2022-10-17 MED ORDER — SODIUM CHLORIDE 0.9 % IV SOLN: INTRAVENOUS | Status: DC

## 2022-10-17 MED ORDER — SODIUM CHLORIDE 0.9% FLUSH: 3.0000 mL | INTRAVENOUS | Status: DC | PRN

## 2022-10-17 MED ORDER — ACETAMINOPHEN 325 MG PO TABS: 650.0000 mg | ORAL_TABLET | ORAL | Status: DC | PRN

## 2022-10-17 MED ORDER — SUGAMMADEX SODIUM 200 MG/2ML IV SOLN
INTRAVENOUS | Status: DC | PRN
  Administered 2022-10-17: 200 mg via INTRAVENOUS

## 2022-10-17 MED ORDER — HEPARIN SODIUM (PORCINE) 1000 UNIT/ML IJ SOLN
INTRAMUSCULAR | Status: DC | PRN
Start: 2022-10-17 — End: 2022-10-17
  Administered 2022-10-17: 13000 [IU] via INTRAVENOUS

## 2022-10-17 MED ORDER — DEXAMETHASONE SODIUM PHOSPHATE 10 MG/ML IJ SOLN
INTRAMUSCULAR | Status: DC | PRN
  Administered 2022-10-17: 5 mg via INTRAVENOUS

## 2022-10-17 MED ORDER — SODIUM CHLORIDE 0.9% FLUSH: 10.0000 mL | Freq: Two times a day (BID) | INTRAVENOUS | Status: DC

## 2022-10-17 MED ORDER — PROPOFOL 10 MG/ML IV BOLUS
INTRAVENOUS | Status: DC | PRN
  Administered 2022-10-17: 180 mg via INTRAVENOUS

## 2022-10-17 MED ORDER — APIXABAN 5 MG PO TABS
5.0000 mg | ORAL_TABLET | Freq: Once | ORAL | Status: AC
  Administered 2022-10-17: 5 mg via ORAL
  Filled 2022-10-17: qty 1

## 2022-10-17 MED ORDER — FENTANYL CITRATE (PF) 250 MCG/5ML IJ SOLN
INTRAMUSCULAR | Status: DC | PRN
  Administered 2022-10-17: 100 ug via INTRAVENOUS

## 2022-10-17 MED ORDER — ATROPINE SULFATE 1 MG/ML IV SOLN
INTRAVENOUS | Status: DC | PRN
Start: 2022-10-17 — End: 2022-10-17
  Administered 2022-10-17: 1 mg via INTRAVENOUS

## 2022-10-17 MED ORDER — SODIUM CHLORIDE 0.9% FLUSH: 3.0000 mL | Freq: Two times a day (BID) | INTRAVENOUS | Status: DC

## 2022-10-17 MED ORDER — ONDANSETRON HCL 4 MG/2ML IJ SOLN
INTRAMUSCULAR | Status: DC | PRN
  Administered 2022-10-17: 4 mg via INTRAVENOUS

## 2022-10-17 MED ORDER — HEPARIN (PORCINE) IN NACL 1000-0.9 UT/500ML-% IV SOLN
INTRAVENOUS | Status: DC | PRN
  Administered 2022-10-17: 500 mL
  Administered 2022-10-17: 1000 mL

## 2022-10-17 MED ORDER — PROTAMINE SULFATE 10 MG/ML IV SOLN
INTRAVENOUS | Status: DC | PRN
Start: 2022-10-17 — End: 2022-10-17
  Administered 2022-10-17: 35 mg via INTRAVENOUS

## 2022-10-17 SURGICAL SUPPLY — 23 items
BAG SNAP BAND KOVER 36X36 (MISCELLANEOUS) IMPLANT
BLANKET WARM UNDERBOD FULL ACC (MISCELLANEOUS) ×1 IMPLANT
CABLE PFA RX CATH CONN (CABLE) IMPLANT
CATH 8FR REPROCESSED SOUNDSTAR (CATHETERS) ×1 IMPLANT
CATH 8FR SOUNDSTAR REPROCESSED (CATHETERS) IMPLANT
CATH FARAWAVE ABLATION 31 (CATHETERS) IMPLANT
CATH OCTARAY 2.0 F 3-3-3-3-3 (CATHETERS) IMPLANT
CATH WEBSTER BI DIR CS D-F CRV (CATHETERS) IMPLANT
CLOSURE PERCLOSE PROSTYLE (VASCULAR PRODUCTS) IMPLANT
COVER SWIFTLINK CONNECTOR (BAG) ×1 IMPLANT
DEVICE CLOSURE MYNXGRIP 6/7F (Vascular Products) IMPLANT
DILATOR VESSEL 38 20CM 16FR (INTRODUCER) IMPLANT
GUIDEWIRE INQWIRE 1.5J.035X260 (WIRE) IMPLANT
INQWIRE 1.5J .035X260CM (WIRE) ×1
PACK EP LATEX FREE (CUSTOM PROCEDURE TRAY) ×1
PACK EP LF (CUSTOM PROCEDURE TRAY) ×1 IMPLANT
PAD DEFIB RADIO PHYSIO CONN (PAD) ×1 IMPLANT
PATCH CARTO3 (PAD) IMPLANT
SHEATH FARADRIVE STEERABLE (SHEATH) IMPLANT
SHEATH PINNACLE 8F 10CM (SHEATH) IMPLANT
SHEATH PINNACLE 9F 10CM (SHEATH) IMPLANT
SHEATH PROBE COVER 6X72 (BAG) IMPLANT
SHEATH WIRE KIT BAYLIS SL1 (KITS) IMPLANT

## 2022-10-17 NOTE — Transfer of Care (Signed)
Immediate Anesthesia Transfer of Care Note  Patient: Wesley [name]  Procedure(s) Performed: ATRIAL FIBRILLATION ABLATION  Patient Location: PACU and Cath Lab  Anesthesia Type:General  Level of Consciousness: drowsy and responds to stimulation  Airway & Oxygen Therapy: Patient Spontanous Breathing and Patient connected to nasal cannula oxygen  Post-op Assessment: Report given to RN and Post -op Vital signs reviewed and stable  Post vital signs: Reviewed and stable  Last Vitals:  Vitals Value Taken Time  BP 140/74 10/17/22 1050  Temp 37 C 10/17/22 1045  Pulse 71 10/17/22 1053  Resp 14 10/17/22 1053  SpO2 93 % 10/17/22 1053  Vitals shown include unfiled device data.  Last Pain:  Vitals:   10/17/22 1045  TempSrc: Oral  PainSc: 0-No pain         Complications: No notable events documented.

## 2022-10-17 NOTE — Anesthesia Postprocedure Evaluation (Signed)
Anesthesia Post Note  Patient: Wesley [name]  Procedure(s) Performed: ATRIAL FIBRILLATION ABLATION     Patient location during evaluation: PACU Anesthesia Type: General Level of consciousness: awake and alert Pain management: pain level controlled Vital Signs Assessment: post-procedure vital signs reviewed and stable Respiratory status: spontaneous breathing, nonlabored ventilation, respiratory function stable and patient connected to nasal cannula oxygen Cardiovascular status: blood pressure returned to baseline and stable Postop Assessment: no apparent nausea or vomiting Anesthetic complications: no   No notable events documented.  Last Vitals:  Vitals:   10/17/22 1219 10/17/22 1230  BP: 134/83 (!) 121/91  Pulse: 68 71  Resp: 14 (!) 21  Temp:    SpO2: 94% 95%    Last Pain:  Vitals:   10/17/22 1110  TempSrc: Oral  PainSc: 0-No pain                 Earl Lites P Braylin Xu

## 2022-10-17 NOTE — Anesthesia Procedure Notes (Signed)
Procedure Name: Intubation Date/Time: 10/17/2022 7:44 AM  Performed by: Loleta Baylea Milburn, CRNAPre-anesthesia Checklist: Patient identified, Patient being monitored, Timeout performed, Emergency Drugs available and Suction available Patient Re-evaluated:Patient Re-evaluated prior to induction Oxygen Delivery Method: Circle system utilized Preoxygenation: Pre-oxygenation with 100% oxygen Induction Type: IV induction Ventilation: Mask ventilation without difficulty Laryngoscope Size: Mac and 4 Grade View: Grade III Tube type: Oral Tube size: 7.5 mm Number of attempts: 1 Airway Equipment and Method: Stylet Placement Confirmation: ETT inserted through vocal cords under direct vision, positive ETCO2 and breath sounds checked- equal and bilateral Secured at: 23 cm Tube secured with: Tape Dental Injury: Teeth and Oropharynx as per pre-operative assessment

## 2022-10-17 NOTE — Interval H&P Note (Signed)
History and Physical Interval Note:  10/17/2022 7:09 AM  Wesley [name]  has presented today for surgery, with the diagnosis of afib.  The various methods of treatment have been discussed with the patient and family. After consideration of risks, benefits and other options for treatment, the patient has consented to  Procedure(s): ATRIAL FIBRILLATION ABLATION (N/A) as a surgical intervention.  The patient's history has been reviewed, patient examined, no change in status, stable for surgery.  I have reviewed the patient's chart and labs.  Questions were answered to the patient's satisfaction.     [name]

## 2022-10-17 NOTE — Discharge Instructions (Addendum)
Stop Multaq in 1 month.  Cardiac Ablation, Care After  This sheet gives you information about how to care for yourself after your procedure. Your health care provider may also give you more specific instructions. If you have problems or questions, contact your health care provider. What can I expect after the procedure? After the procedure, it is common to have: Bruising around your puncture site. Tenderness around your puncture site. Skipped heartbeats. If you had an atrial fibrillation ablation, you may have atrial fibrillation during the first several months after your procedure.  Tiredness (fatigue).  Follow these instructions at home: Puncture site care  Follow instructions from your health care provider about how to take care of your puncture site. Make sure you: If present, leave stitches (sutures), skin glue, or adhesive strips in place. These skin closures may need to stay in place for up to 2 weeks. If adhesive strip edges start to loosen and curl up, you may trim the loose edges. Do not remove adhesive strips completely unless your health care provider tells you to do that. If a large square bandage is present, this may be removed 24 hours after surgery.  Check your puncture site every day for signs of infection. Check for: Redness, swelling, or pain. Fluid or blood. If your puncture site starts to bleed, lie down on your back, apply firm pressure to the area, and contact your health care provider. Warmth. Pus or a bad smell. A pea or small marble sized lump at the site is normal and can take up to three months to resolve.  Driving Do not drive for at least 4 days after your procedure or however long your health care provider recommends. (Do not resume driving if you have previously been instructed not to drive for other health reasons.) Do not drive or use heavy machinery while taking prescription pain medicine. Activity Avoid activities that take a lot of effort for at least 7  days after your procedure. Do not lift anything that is heavier than 5 lb (4.5 kg) for one week.  No sexual activity for 1 week.  Return to your normal activities as told by your health care provider. Ask your health care provider what activities are safe for you. General instructions Take over-the-counter and prescription medicines only as told by your health care provider. Do not use any products that contain nicotine or tobacco, such as cigarettes and e-cigarettes. If you need help quitting, ask your health care provider. You may shower after 24 hours, but Do not take baths, swim, or use a hot tub for 1 week.  Do not drink alcohol for 24 hours after your procedure. Keep all follow-up visits as told by your health care provider. This is important. Contact a health care provider if: You have redness, mild swelling, or pain around your puncture site. You have fluid or blood coming from your puncture site that stops after applying firm pressure to the area. Your puncture site feels warm to the touch. You have pus or a bad smell coming from your puncture site. You have a fever. You have chest pain or discomfort that spreads to your neck, jaw, or arm. You have chest pain that is worse with lying on your back or taking a deep breath. You are sweating a lot. You feel nauseous. You have a fast or irregular heartbeat. You have shortness of breath. You are dizzy or light-headed and feel the need to lie down. You have pain or numbness in the arm  or leg closest to your puncture site. Get help right away if: Your puncture site suddenly swells. Your puncture site is bleeding and the bleeding does not stop after applying firm pressure to the area. These symptoms may represent a serious problem that is an emergency. Do not wait to see if the symptoms will go away. Get medical help right away. Call your local emergency services (911 in the [place] Do not drive yourself to the hospital. Summary After the  procedure, it is normal to have bruising and tenderness at the puncture site in your groin, neck, or forearm. Check your puncture site every day for signs of infection. Get help right away if your puncture site is bleeding and the bleeding does not stop after applying firm pressure to the area. This is a medical emergency. This information is not intended to replace advice given to you by your health care provider. Make sure you discuss any questions you have with your health care provider.

## 2022-10-18 ENCOUNTER — Telehealth: Payer: Self-pay | Admitting: Cardiology

## 2022-10-18 ENCOUNTER — Encounter (HOSPITAL_COMMUNITY): Payer: Self-pay | Admitting: Cardiology

## 2022-10-18 ENCOUNTER — Encounter: Payer: Self-pay | Admitting: Cardiology

## 2022-10-18 LAB — POCT ACTIVATED CLOTTING TIME
Activated Clotting Time: 311 s
Activated Clotting Time: 372 s

## 2022-10-18 NOTE — Telephone Encounter (Signed)
Returned Pts call. Left message to call back it he needed [place].

## 2022-10-18 NOTE — Telephone Encounter (Signed)
Patient received call and accidentally disconnected before speaking to anyone. Requesting call back from caller.

## 2022-10-24 ENCOUNTER — Institutional Professional Consult (permissible substitution): Payer: Medicare PPO | Admitting: Cardiology

## 2022-10-24 NOTE — Plan of Care (Signed)
[facility] Tonsillectomy/Adenoidectomy, Postoperative PEDS care plan entered in error.

## 2022-10-25 DIAGNOSIS — N401 Enlarged prostate with lower urinary tract symptoms: Secondary | ICD-10-CM | POA: Diagnosis not present

## 2022-10-25 DIAGNOSIS — R351 Nocturia: Secondary | ICD-10-CM | POA: Diagnosis not present

## 2022-10-25 DIAGNOSIS — N200 Calculus of kidney: Secondary | ICD-10-CM | POA: Diagnosis not present

## 2022-10-25 DIAGNOSIS — C61 Malignant neoplasm of prostate: Secondary | ICD-10-CM | POA: Diagnosis not present

## 2022-10-30 ENCOUNTER — Other Ambulatory Visit (HOSPITAL_COMMUNITY): Payer: Self-pay | Admitting: *Deleted

## 2022-10-30 MED ORDER — LISINOPRIL 5 MG PO TABS: 5.0000 mg | ORAL_TABLET | Freq: Every day | ORAL | 6 refills | Status: DC

## 2022-11-14 ENCOUNTER — Ambulatory Visit (HOSPITAL_COMMUNITY)
Admission: RE | Admit: 2022-11-14 | Discharge: 2022-11-14 | Disposition: A | Discharge status: Home / Self Care | Payer: Medicare PPO | Source: Ambulatory Visit | Attending: Internal Medicine | Admitting: Internal Medicine

## 2022-11-14 VITALS — BP 160/84 | HR 76 | Ht 71.0 in | Wt 182.8 lb

## 2022-11-14 DIAGNOSIS — Z7901 Long term (current) use of anticoagulants: Secondary | ICD-10-CM | POA: Diagnosis not present

## 2022-11-14 DIAGNOSIS — R9431 Abnormal electrocardiogram [ECG] [EKG]: Secondary | ICD-10-CM | POA: Diagnosis not present

## 2022-11-14 DIAGNOSIS — I700 Atherosclerosis of aorta: Secondary | ICD-10-CM | POA: Insufficient documentation

## 2022-11-14 DIAGNOSIS — E785 Hyperlipidemia, unspecified: Secondary | ICD-10-CM | POA: Insufficient documentation

## 2022-11-14 DIAGNOSIS — G4733 Obstructive sleep apnea (adult) (pediatric): Secondary | ICD-10-CM | POA: Insufficient documentation

## 2022-11-14 DIAGNOSIS — I4819 Other persistent atrial fibrillation: Secondary | ICD-10-CM | POA: Diagnosis not present

## 2022-11-14 DIAGNOSIS — D6869 Other thrombophilia: Secondary | ICD-10-CM | POA: Diagnosis not present

## 2022-11-14 DIAGNOSIS — H0015 Chalazion left lower eyelid: Secondary | ICD-10-CM | POA: Diagnosis not present

## 2022-11-14 DIAGNOSIS — I10 Essential (primary) hypertension: Secondary | ICD-10-CM | POA: Diagnosis not present

## 2022-11-14 DIAGNOSIS — Z8546 Personal history of malignant neoplasm of prostate: Secondary | ICD-10-CM | POA: Insufficient documentation

## 2022-11-14 LAB — BASIC METABOLIC PANEL
Anion gap: 9 (ref 5–15)
BUN: 10 mg/dL (ref 8–23)
CO2: 26 mmol/L (ref 22–32)
Calcium: 9.3 mg/dL (ref 8.9–10.3)
Chloride: 105 mmol/L (ref 98–111)
Creatinine, Ser: 1.37 mg/dL — ABNORMAL HIGH (ref 0.61–1.24)
GFR, Estimated: 53 mL/min — ABNORMAL LOW (ref >60)
Glucose, Bld: 133 mg/dL — ABNORMAL HIGH (ref 70–99)
Potassium: 3.6 mmol/L (ref 3.5–5.1)
Sodium: 140 mmol/L (ref 135–145)

## 2022-11-14 NOTE — Progress Notes (Signed)
Primary Care Physician: Shon Hale, MD Primary Cardiologist: Orbie Pyo, MD Electrophysiologist: [name], MD     Referring Physician: Gillian Shields, NP     Wesley [name] is a 77 y.o. male with a history of HTN, HLD, aortic atherosclerosis by CT imaging, OSA, prostate cancer, and atrial fibrillation who presents for consultation in the [facility]. Recently underwent successful DCCV on 07/31/22 and seen by Cardiology on 08/14/22 with ERAF. Diltiazem increased to 180 mg daily. Patient is on Eliquis for a CHADS2VASC score of 4.  On evaluation today, he is currently in Afib. He feels fatigue and exercise intolerance when in Afib. He has ERAF s/p DCCV on 07/31/22. He is not compliant with CPAP. Drinks 1 glass of wine nightly.   On follow up 11/14/22, he is currently in NSR. S/p Afib ablation on 10/17/22 by Dr. Jimmey Ralph. Patient was advised to stop Multaq 1 month after ablation. No episodes of Afib since ablation. No chest pain, SOB, or trouble swallowing. Leg sites healed without issue. No missed doses of anticoagulant.  Today, he denies symptoms of orthopnea, PND, lower extremity edema, dizziness, presyncope, syncope, snoring, daytime somnolence, bleeding, or neurologic sequela. The patient is tolerating medications without difficulties and is otherwise without complaint today.    Atrial Fibrillation Risk Factors:  he does have symptoms or diagnosis of sleep apnea. he is NOT compliant with CPAP therapy.  he has a BMI of Body mass index is 25.5 kg/m.Marland Kitchen Filed Weights   11/14/22 0856  Weight: 82.9 kg     Current Outpatient Medications  Medication Sig Dispense Refill   acetaminophen (TYLENOL) 500 MG tablet Take 1,000 mg by mouth as needed for moderate pain (pain score 4-6).     apixaban (ELIQUIS) 5 MG TABS tablet Take 1 tablet (5 mg total) by mouth 2 (two) times daily. 60 tablet 5   atorvastatin (LIPITOR) 10 MG tablet Take 10 mg by  mouth daily.     buPROPion (WELLBUTRIN XL) 150 MG 24 hr tablet Take 150 mg by mouth daily.     hydrochlorothiazide (MICROZIDE) 12.5 MG capsule Take 12.5 mg by mouth daily.     lisinopril (ZESTRIL) 5 MG tablet Take 1 tablet (5 mg total) by mouth daily. 30 tablet 6   tamsulosin (FLOMAX) 0.4 MG CAPS capsule Take 0.4 mg by mouth daily after breakfast.     No current facility-administered medications for this encounter.    Atrial Fibrillation Management history:  Previous antiarrhythmic drugs: None Previous cardioversions: 05/24/21, 07/31/22 Previous ablations: 10/17/22 Anticoagulation history: Eliquis   ROS- All systems are reviewed and negative except as per the HPI above.  Physical Exam: BP (!) 160/84   Pulse 76   Ht 5\' 11"  (1.803 m)   Wt 82.9 kg   BMI 25.50 kg/m   GEN- The patient is well appearing, alert and oriented x 3 today.   Neck - no JVD or carotid bruit noted Lungs- Clear to ausculation bilaterally, normal work of breathing Heart- Regular rate and rhythm, no murmurs, rubs or gallops, PMI not laterally displaced Extremities- no clubbing, cyanosis, or edema Skin - no rash or ecchymosis noted   EKG today demonstrates  Vent. rate 76 BPM PR interval 144 ms QRS duration 84 ms QT/QTcB 374/420 ms P-R-T axes 52 56 41 Sinus rhythm with Premature atrial complexes Nonspecific ST abnormality Abnormal ECG When compared with ECG of 17-Oct-2022 10:51, PREVIOUS ECG IS PRESENT  Echo 06/05/22 demonstrated   1. Left ventricular  ejection fraction, by estimation, is 60 to 65%. The  left ventricle has normal function.   2. Right ventricular systolic function is normal. The right ventricular  size is normal.   3. The mitral valve is normal in structure. No evidence of mitral valve  regurgitation.   4. Aortic valve regurgitation is not visualized.    ASSESSMENT & PLAN CHA2DS2-VASc Score = 4  The patient's score is based upon: CHF History: 0 HTN History: 1 Diabetes History:  0 Stroke History: 0 Vascular Disease History: 1 Age Score: 2 Gender Score: 0       ASSESSMENT AND PLAN: Persistent Atrial Fibrillation (ICD10:  I48.19) The patient's CHA2DS2-VASc score is 4, indicating a 4.8% annual risk of stroke.   S/p Afib ablation on 10/17/22 by Dr. Jimmey Ralph.  He is currently in NSR. Patient was advised to stop Multaq 1 month after ablation.    Secondary Hypercoagulable State (ICD10:  D68.69) The patient is at significant risk for stroke/thromboembolism based upon his CHA2DS2-VASc Score of 4.  Continue Apixaban (Eliquis).  No missed doses.     Follow up as scheduled with Dr. Jimmey Ralph.   Lake Bells, PA-C  [facility] [address] [place], [place] [address] [phone]

## 2022-11-14 NOTE — Addendum Note (Signed)
Encounter addended by: Shona Simpson, RN on: 11/14/2022 11:08 AM  Actions taken: Order list changed, Diagnosis association updated

## 2022-11-15 ENCOUNTER — Other Ambulatory Visit (HOSPITAL_COMMUNITY): Payer: Self-pay | Admitting: *Deleted

## 2022-11-15 MED ORDER — HYDROCHLOROTHIAZIDE 25 MG PO TABS: 25.0000 mg | ORAL_TABLET | Freq: Every day | ORAL | 3 refills | Status: DC

## 2022-11-20 DIAGNOSIS — E7800 Pure hypercholesterolemia, unspecified: Secondary | ICD-10-CM | POA: Diagnosis not present

## 2022-12-06 DIAGNOSIS — C61 Malignant neoplasm of prostate: Secondary | ICD-10-CM | POA: Diagnosis not present

## 2023-01-11 ENCOUNTER — Ambulatory Visit: Payer: Medicare PPO | Attending: Cardiology | Admitting: Cardiology

## 2023-01-11 ENCOUNTER — Encounter: Payer: Self-pay | Admitting: Cardiology

## 2023-01-11 VITALS — BP 162/80 | HR 81 | Ht 71.0 in | Wt 183.0 lb

## 2023-01-11 DIAGNOSIS — I4819 Other persistent atrial fibrillation: Secondary | ICD-10-CM | POA: Diagnosis not present

## 2023-01-11 DIAGNOSIS — D6869 Other thrombophilia: Secondary | ICD-10-CM

## 2023-01-11 DIAGNOSIS — R5381 Other malaise: Secondary | ICD-10-CM | POA: Diagnosis not present

## 2023-01-11 DIAGNOSIS — I10 Essential (primary) hypertension: Secondary | ICD-10-CM

## 2023-01-11 NOTE — Patient Instructions (Signed)
 Medication Instructions:  Your physician recommends that you continue on your current medications as directed. Please refer to the Current Medication list given to you today.  *If you need a refill on your cardiac medications before your next appointment, please call your pharmacy*  Follow-Up: At [facility], you and your health needs are our priority.  As part of our continuing mission to provide you with exceptional heart care, we have created designated Provider Care Teams.  These Care Teams include your primary Cardiologist (physician) and Advanced Practice Providers (APPs -  Physician Assistants and Nurse Practitioners) who all work together to provide you with the care you need, when you need it.  Your next appointment:   6 months  Provider:   You may see Fonda Kitty, MD or one of the following Advanced Practice Providers on your designated Care Team:   Charlies Arthur, PA-C Michael Andy Tillery, PA-C Suzann Riddle, NP Daphne Barrack, NP    Other Instructions You have been referred to Cardiac Rehab

## 2023-01-11 NOTE — Progress Notes (Signed)
 Electrophysiology Office Note:   Date:  01/11/2023  ID:  Dhanush, Jokerst 03/22/1945, MRN [id number]  Primary Cardiologist: Lurena MARLA Red, MD Electrophysiologist: Fonda Kitty, MD      History of Present Illness:   ALIJAH [name] is a 78 y.o. male with h/o HTN, HLD, aortic atherosclerosis by CT imaging, OSA, and prostate cancer seen today for  for Electrophysiology evaluation of atrial fibrillation at the request of Dr. Red. Patient was first diagnosed with atrial fibrillation in April 2023.  He was started on metoprolol  and apixaban .  He was referred for sleep study which did show sleep apnea.  He underwent successful cardioversion to normal sinus rhythm in May 2023.  He then developed recurrent atrial fibrillation in July 2024 again requiring cardioversion.  Unfortunately, he only remained in normal sinus rhythm for about 1 week at this time before going back into atrial fibrillation. His primary symptoms are fatigue and exertional intolerance.    Interval History:  Patient underwent catheter ablation on 10/17/2022.  Since his procedure, he reports doing relatively well. He has had no known recurrences of atrial fibrillation. He is hoping to become more active, but feels very deconditioned.   Review of systems complete and found to be negative unless listed in HPI.   EP Information / Studies Reviewed:    EKG is ordered today. Personal review as below.  EKG Interpretation Date/Time:  Friday January 11 2023 13:59:04 EST Ventricular Rate:  81 PR Interval:  152 QRS Duration:  86 QT Interval:  360 QTC Calculation: 418 R Axis:   56  Text Interpretation: Sinus rhythm with Premature atrial complexes Nonspecific ST abnormality Artifact When compared with ECG of 14-Nov-2022 09:11,no significant change. Confirmed by Kitty Fonda [phone]) on 01/11/2023 2:34:14 PM   08/23/22 EKG:    Echo 06/05/2022: Normal LV function, LVEF 60 to 65%. Normal RV function and size. Mild to  moderately enlarged left atrium. No significant valvular disease mentioned.  Risk Assessment/Calculations:    CHA2DS2-VASc Score = 4   This indicates a 4.8% annual risk of stroke. The patient's score is based upon: CHF History: 0 HTN History: 1 Diabetes History: 0 Stroke History: 0 Vascular Disease History: 1 Age Score: 2 Gender Score: 0         Physical Exam:   VS:  BP (!) 162/80   Pulse 81   Ht 5' 11 (1.803 m)   Wt 183 lb (83 kg)   SpO2 94%   BMI 25.52 kg/m    Wt Readings from Last 3 Encounters:  01/11/23 183 lb (83 kg)  11/14/22 182 lb 12.8 oz (82.9 kg)  10/17/22 185 lb (83.9 kg)     GEN: Well nourished, well developed in no acute distress NECK: No JVD CARDIAC: Normal rate and regular rhythm RESPIRATORY:  Clear to auscultation without rales, wheezing or rhonchi  ABDOMEN: Soft, non-tender, non-distended EXTREMITIES:  No edema; No deformity   ASSESSMENT AND PLAN:   Wesley [name] is a 78 y.o. male with h/o HTN, HLD, aortic atherosclerosis by CT imaging, OSA, and prostate cancer seen today for  for Electrophysiology evaluation of atrial fibrillation at the request of Dr. Red.    #. Symptomatic persistent atrial fibrillation status post catheter ablation on 10/17/2022 -Dronedarone  was stopped at time of ablation. Appears to be maintaining normal rhythm. -Continue Eliquis .  #.  Secondary hypercoagulable state due to atrial fibrillation: -He has a CHA2DS2-VASc of 4 -Currently taking Eliquis  with no issue.   #. Physical deconditioning: He  avoided strenuous activity since AF diagnosis as he felt this was a trigger for recurrence. He has become deconditioned as a result of this. Now that he has undergone ablation with no recurrence of atrial fib, he would like to resume exercise.  - Refer to cardiac rehab.  #.  Hypertension: -Above goal today.  Reinforced importance of adequate blood pressure control and prevention of atrial fibrillation. Continue current  regimen and close follow-up with PCP.   Signed, Fonda Kitty, MD

## 2023-01-18 ENCOUNTER — Other Ambulatory Visit: Payer: Self-pay

## 2023-01-18 DIAGNOSIS — G4733 Obstructive sleep apnea (adult) (pediatric): Secondary | ICD-10-CM

## 2023-01-18 DIAGNOSIS — I4819 Other persistent atrial fibrillation: Secondary | ICD-10-CM

## 2023-01-23 ENCOUNTER — Telehealth: Payer: Self-pay

## 2023-01-23 NOTE — Telephone Encounter (Signed)
Called re: PREP program referral, left voicemail requesting return call.  

## 2023-01-25 ENCOUNTER — Ambulatory Visit: Payer: Medicare PPO | Admitting: Cardiology

## 2023-01-31 ENCOUNTER — Telehealth: Payer: Self-pay

## 2023-01-31 NOTE — Telephone Encounter (Signed)
Called again [id number] program referral; left voicemail requesting return call.

## 2023-03-04 DIAGNOSIS — F321 Major depressive disorder, single episode, moderate: Secondary | ICD-10-CM | POA: Diagnosis not present

## 2023-03-04 DIAGNOSIS — R29818 Other symptoms and signs involving the nervous system: Secondary | ICD-10-CM | POA: Diagnosis not present

## 2023-03-04 DIAGNOSIS — I10 Essential (primary) hypertension: Secondary | ICD-10-CM | POA: Diagnosis not present

## 2023-03-04 DIAGNOSIS — R413 Other amnesia: Secondary | ICD-10-CM | POA: Diagnosis not present

## 2023-03-07 DIAGNOSIS — N401 Enlarged prostate with lower urinary tract symptoms: Secondary | ICD-10-CM | POA: Diagnosis not present

## 2023-03-07 DIAGNOSIS — M170 Bilateral primary osteoarthritis of knee: Secondary | ICD-10-CM | POA: Diagnosis not present

## 2023-03-14 DIAGNOSIS — C61 Malignant neoplasm of prostate: Secondary | ICD-10-CM | POA: Diagnosis not present

## 2023-03-14 DIAGNOSIS — N401 Enlarged prostate with lower urinary tract symptoms: Secondary | ICD-10-CM | POA: Diagnosis not present

## 2023-03-14 DIAGNOSIS — N281 Cyst of kidney, acquired: Secondary | ICD-10-CM | POA: Diagnosis not present

## 2023-03-14 DIAGNOSIS — R351 Nocturia: Secondary | ICD-10-CM | POA: Diagnosis not present

## 2023-03-25 ENCOUNTER — Other Ambulatory Visit: Payer: Self-pay

## 2023-03-25 ENCOUNTER — Ambulatory Visit: Attending: Family Medicine

## 2023-03-25 DIAGNOSIS — R2689 Other abnormalities of gait and mobility: Secondary | ICD-10-CM | POA: Diagnosis not present

## 2023-03-25 DIAGNOSIS — R262 Difficulty in walking, not elsewhere classified: Secondary | ICD-10-CM | POA: Insufficient documentation

## 2023-03-25 DIAGNOSIS — R2681 Unsteadiness on feet: Secondary | ICD-10-CM | POA: Insufficient documentation

## 2023-03-25 NOTE — Therapy (Signed)
 OUTPATIENT PHYSICAL THERAPY NEURO EVALUATION   Patient Name: Wesley [name] MRN: [id number] DOB:[birth year], 78 y.o., male Today's Date: 03/25/2023   PCP: Shon Hale, MD REFERRING PROVIDER: Shon Hale, MD  END OF SESSION:  PT End of Session - 03/25/23 0757     Visit Number 1    Number of Visits 10    Date for PT Re-Evaluation 05/13/23    Authorization Type [employer]    Authorization Time Period auth required    Progress Note Due on Visit 10    PT Start Time 0800    PT Stop Time 0845    PT Time Calculation (min) 45 min             Past Medical History:  Diagnosis Date   Depression    Diverticulosis    Hyperlipidemia    Hypertension    Migraine    OSA on CPAP    mild obstructive sleep apnea with an AHI of 8 and O2 saturations as low as 85%.  On CPAP 10cm H2O   Past Surgical History:  Procedure Laterality Date   ATRIAL FIBRILLATION ABLATION N/A 10/17/2022   Procedure: ATRIAL FIBRILLATION ABLATION;  Surgeon: [name], MD;  Location: [facility] INVASIVE CV LAB;  Service: Cardiovascular;  Laterality: N/A;   CARDIOVERSION N/A 05/24/2021   Procedure: CARDIOVERSION;  Surgeon: Little Ishikawa, MD;  Location: [facility] ENDOSCOPY;  Service: Cardiovascular;  Laterality: N/A;   CARDIOVERSION N/A 07/31/2022   Procedure: CARDIOVERSION;  Surgeon: Thomasene Ripple, DO;  Location: [facility] INVASIVE CV LAB;  Service: Cardiovascular;  Laterality: N/A;   CYSTOSCOPY WITH INSERTION OF UROLIFT N/A 04/28/2019   Procedure: CYSTOSCOPY WITH INSERTION OF UROLIFT;  Surgeon: Jerilee Field, MD;  Location: [facility];  Service: Urology;  Laterality: N/A;   GANGLION CYST EXCISION     PROSTATE BIOPSY N/A 04/28/2019   Procedure: BIOPSY TRANSRECTAL ULTRASONIC PROSTATE (TUBP);  Surgeon: Jerilee Field, MD;  Location: [facility];  Service: Urology;  Laterality: N/A;   Patient Active Problem List   Diagnosis Date Noted   Hypercoagulable state due  to persistent atrial fibrillation (HCC) 08/23/2022   OSA on CPAP 01/03/2022   Persistent atrial fibrillation (HCC)    Diverticulitis of colon with perforation 12/18/2020   Bruxism 05/07/2017   Laryngopharyngeal reflux (LPR) 05/07/2017   Presbycusis of both ears 05/07/2017   Hemorrhoids, external, thrombosed 02/13/2012    ONSET DATE: worse over past 6 months  REFERRING DIAG:  R29.818 (ICD-10-CM) - Other symptoms and signs involving the nervous system    THERAPY DIAG:  Unsteadiness on feet  Difficulty in walking, not elsewhere classified  Other abnormalities of gait and mobility  Rationale for Evaluation and Treatment: Rehabilitation  SUBJECTIVE:  SUBJECTIVE STATEMENT: Has had episodes of A-fib but this has been improved since ablation and medical mgmt.  Been noticing worsening balance and difficulty walking. Having falls x once a week and notes feeling of fluid in ears and feeling of dizziness present and notes that it is more aggravating when walking, denies any positional dizziness such as laying down or turning in bed.  Reports he has been working with a trainer 2x/wk for strength training.  Reports he has a consultation upcoming with neuro due to his balance issues and notes cognitive issues such as difficulty in recall and finishing sentences. Has hx of knee OA and that can cause some difficulty with walking up/down stairs, arising from low seats/car, getting up from floor.  Spouse reports he is shuffling his feet more often.  Pt accompanied by: self  PERTINENT HISTORY: he has had episodes of feeling unsteady while walking. he was trying to keep up at the theater while walking and almost fell (his son caught him). sometimes feels lightheaded. he feels not strong overall.  +shuffling gait for years  his wife says.  Reports he received cortizone shots to bilat knees.   PAIN:  Are you having pain? No  PRECAUTIONS: Fall  RED FLAGS: None   WEIGHT BEARING RESTRICTIONS: No  FALLS: Has patient fallen in last 6 months? Yes. Number of falls falls 1x/wk  LIVING ENVIRONMENT: Lives with: lives with their spouse Lives in: House/apartment Stairs: Yes Has following equipment at home: None  PLOF: Independent  PATIENT GOALS: improve symptoms/balance  OBJECTIVE:  Note: Objective measures were completed at Evaluation unless otherwise noted.  Vitals: 148/86 mmHg, 76 bpm  DIAGNOSTIC FINDINGS:   COGNITION: Overall cognitive status: Within functional limits for tasks assessed, reports some increased difficulty with memory and word finding   SENSATION: Not tested, reports able to  COORDINATION: WNL  EDEMA:  none  MUSCLE TONE:  NT  MUSCLE LENGTH: WFL  DTRs: NT   POSTURE: forward head  LOWER EXTREMITY ROM:     Active  Right Eval Left Eval  Hip flexion WNL WNL  Hip extension    Hip abduction WNL WNL  Hip adduction    Hip internal rotation    Hip external rotation    Knee flexion WNL WNL  Knee extension WNL WNL  Ankle dorsiflexion WNL WNL  Ankle plantarflexion WNL WNL  Ankle inversion    Ankle eversion     (Blank rows = not tested)  LOWER EXTREMITY MMT:  resisted tests in sitting  MMT Right Eval Left Eval  Hip flexion 5 5  Hip extension    Hip abduction 4 4  Hip adduction 5 5  Hip internal rotation    Hip external rotation    Knee flexion 5 5  Knee extension 5 5  Ankle dorsiflexion 5 5  Ankle plantarflexion    Ankle inversion    Ankle eversion    (Blank rows = not tested)  --very pronounced knee crepitus during manual muscle testing  BED MOBILITY:  indep  TRANSFERS: Assistive device utilized: None  Sit to stand: Complete Independence Stand to sit: Complete Independence Chair to chair: Complete Independence Floor: NT    CURB:  Level of  Assistance: Complete Independence Assistive device utilized: None Curb Comments:   STAIRS: Level of Assistance: Complete Independence and Modified independence Stair Negotiation Technique: Alternating Pattern  with No Rails Single Rail on Right Number of Stairs:   Height of Stairs:   Comments:   GAIT: Gait pattern: [facility] Distance  walked:  Assistive device utilized: None Level of assistance: Complete Independence, decreased speed Comments: instances of shuffling/right toe catch  FUNCTIONAL TESTS:  5 times sit to stand: 17.41 sec Functional gait assessment: 18/30 Gait speed: 1.9 ft sec  M-CTSIB  Condition 1: Firm Surface, EO 30 Sec, Normal Sway  Condition 2: Firm Surface, EC 30 Sec, Moderate Sway  Condition 3: Foam Surface, EO 30 Sec, Normal and Mild Sway  Condition 4: Foam Surface, EC 30 Sec, Moderate and Severe Sway     [facility] PT Assessment - 03/25/23 0001       Functional Gait  Assessment   Gait assessed  Yes    Gait Level Surface Walks 20 ft, slow speed, abnormal gait pattern, evidence for imbalance or deviates 10-15 in outside of the 12 in walkway width. Requires more than 7 sec to ambulate 20 ft.    Change in Gait Speed Able to change speed, demonstrates mild gait deviations, deviates 6-10 in outside of the 12 in walkway width, or no gait deviations, unable to achieve a major change in velocity, or uses a change in velocity, or uses an assistive device.    Gait with Horizontal Head Turns Performs head turns smoothly with slight change in gait velocity (eg, minor disruption to smooth gait path), deviates 6-10 in outside 12 in walkway width, or uses an assistive device.    Gait with Vertical Head Turns Performs task with moderate change in gait velocity, slows down, deviates 10-15 in outside 12 in walkway width but recovers, can continue to walk.    Gait and Pivot Turn Pivot turns safely within 3 sec and stops quickly with no loss of balance.    Step Over Obstacle Is able to  step over one shoe box (4.5 in total height) without changing gait speed. No evidence of imbalance.    Gait with Narrow Base of Support Ambulates 7-9 steps.    Gait with Eyes Closed Walks 20 ft, slow speed, abnormal gait pattern, evidence for imbalance, deviates 10-15 in outside 12 in walkway width. Requires more than 9 sec to ambulate 20 ft.    Ambulating Backwards Walks 20 ft, uses assistive device, slower speed, mild gait deviations, deviates 6-10 in outside 12 in walkway width.    Steps Alternating feet, must use rail.    Total Score 18                                                                                                                                          TREATMENT DATE: 03/25/23    PATIENT EDUCATION: Education details: assessment details, rationale for further assessment, demonstrated method for assessing orthostatic hypotension,  HEP initiation Person educated: Patient Education method: Explanation and Handouts Education comprehension: needs further education  HOME EXERCISE PROGRAM: Access Code: [email or web address] URL: [email or web address] Date: 03/25/2023 Prepared by: Shary Decamp  Exercises - Corner Balance Feet Together With Eyes Closed  -  1 x daily - 7 x weekly - 3 sets - 30 sec hold - Corner Balance Feet Together: Eyes Open With Head Turns  - 1 x daily - 7 x weekly - 3 sets - 3 reps - Corner Balance Feet Together: Eyes Closed With Head Turns  - 1 x daily - 7 x weekly - 3 sets - 3 reps  GOALS: Goals reviewed with patient? Yes  SHORT TERM GOALS: Target date: 04/15/2023    Patient will be independent in HEP to improve functional outcomes Baseline: Goal status: INITIAL  2.  Demo improved BLE strength and reduced risk for falls per time < 15 sec 5xSTS test Baseline: 17 sec Goal status: INITIAL  3.  Demo improved postural stability/awareness per mild sway x 30 sec condition 4 M-CTSIB to improve safety with ADL Baseline: mod-severe x 30  sec Goal status: INITIAL    LONG TERM GOALS: Target date: 05/13/23   Demonstrate improved balance and reduce risk for falls per score 25/30 Functional Gait Assessment Baseline: 18/30 Goal status: INITIAL  2.  Independent in advanced HEP for improved carryover and max benefit at D/C (gym-based strength, balance class, aquatic exercise, etc) Baseline:  Goal status: INITIAL  3.  Demo ability to maintain ambulation speed 2.8 ft/sec to improve efficiency of community mobility Baseline: 1.9 ft/sec Goal status: INITIAL    ASSESSMENT:  CLINICAL IMPRESSION: Patient is a 78 y.o. male who was seen today for physical therapy evaluation and treatment for Other symptoms and signs involving the nervous system.  Exhibits high risk for falls per outcome measures as listed above and demonstrates increased difficulty in walking with reduced gait speed and deviations with shuffling and instances of stumbling walking level surfaces.  Pt reports frequency of falling at least 1x/wk and reports pervasive feeling of dizziness which is worse when walking and demonstrates moderate-severe sway under conditions 2 and 4 M-CTSIB and endorses feeling of fluid in ears but denies any obvious positional dizziness associated with these issues.  Pt reports ongoing issue of knee OA which also causes pain and activity limitations and may benefit from sessions in aquatic PT to enable greater degree of activity for balance, LE strength by way of reducing knee pain from benefit of aquatic environment.   Initiated with HEP for gentle and safe static balance movements to improve postural stability.  Continued sessions to improve upon symptoms and reduce risk for falls.   OBJECTIVE IMPAIRMENTS: Abnormal gait, decreased activity tolerance, decreased balance, decreased knowledge of use of DME, difficulty walking, decreased strength, dizziness, impaired perceived functional ability, and pain.   ACTIVITY LIMITATIONS: carrying, lifting,  bending, squatting, stairs, transfers, and locomotion level  PARTICIPATION LIMITATIONS: cleaning, interpersonal relationship, shopping, community activity, occupation, and exercise class participation  PERSONAL FACTORS: Age, Time since onset of injury/illness/exacerbation, and 3+ comorbidities: PMH  are also affecting patient's functional outcome.   REHAB POTENTIAL: Good  CLINICAL DECISION MAKING: Evolving/moderate complexity  EVALUATION COMPLEXITY: Moderate  PLAN:  PT FREQUENCY: 1-2x/week  PT DURATION: 6 weeks  PLANNED INTERVENTIONS: 97110-Therapeutic exercises, 97530- Therapeutic activity, 97112- Neuromuscular re-education, 97535- Self Care, 16109- Manual therapy, [phone]- Gait training, [phone]- Canalith repositioning, [phone]- Aquatic Therapy, Patient/Family education, Balance training, Stair training, Taping, Dry Needling, Joint mobilization, Spinal mobilization, Vestibular training, and DME instructions  PLAN FOR NEXT SESSION: vestibular assessment, denies positional dizziness but reports feeling of dizziness/oscillopsia with walking, HEP review and progressions   9:11 AM, 03/25/23 M. Shary Decamp, PT, DPT Physical Therapist- [place] Office Number: [phone]  Referring diagnosis?  R29.818 (ICD-10-CM) - Other symptoms and signs involving the nervous system   Treatment diagnosis? (if different than referring diagnosis) Unsteadiness on feet  Difficulty in walking, not elsewhere classified  Other abnormalities of gait and mobility  What was this (referring dx) caused by? []  Surgery []  Fall [x]  Ongoing issue []  Arthritis []  Other: ____________  Laterality: []  Rt []  Lt [x]  Both  Check all possible CPT codes:  *CHOOSE 10 OR LESS*    See Planned Interventions listed in the Plan section of the Evaluation.

## 2023-03-26 ENCOUNTER — Other Ambulatory Visit: Payer: Self-pay | Admitting: Internal Medicine

## 2023-03-26 DIAGNOSIS — I4819 Other persistent atrial fibrillation: Secondary | ICD-10-CM

## 2023-03-26 NOTE — Telephone Encounter (Signed)
 Eliquis 5mg  refill request received. Patient is 78 years old, weight-83kg, Crea- 1.37 on 11/14/22, Diagnosis-Afib, and last seen by Dr. [name] on 01/11/23. Dose is appropriate based on dosing criteria. Will send in refill to requested pharmacy.

## 2023-03-28 DIAGNOSIS — I10 Essential (primary) hypertension: Secondary | ICD-10-CM | POA: Diagnosis not present

## 2023-03-28 DIAGNOSIS — F321 Major depressive disorder, single episode, moderate: Secondary | ICD-10-CM | POA: Diagnosis not present

## 2023-04-03 ENCOUNTER — Encounter: Payer: Self-pay | Admitting: Physical Therapy

## 2023-04-03 ENCOUNTER — Ambulatory Visit: Attending: Family Medicine | Admitting: Physical Therapy

## 2023-04-03 DIAGNOSIS — M6281 Muscle weakness (generalized): Secondary | ICD-10-CM | POA: Diagnosis not present

## 2023-04-03 DIAGNOSIS — R2681 Unsteadiness on feet: Secondary | ICD-10-CM | POA: Insufficient documentation

## 2023-04-03 DIAGNOSIS — R262 Difficulty in walking, not elsewhere classified: Secondary | ICD-10-CM | POA: Insufficient documentation

## 2023-04-03 DIAGNOSIS — R2689 Other abnormalities of gait and mobility: Secondary | ICD-10-CM | POA: Insufficient documentation

## 2023-04-03 NOTE — Therapy (Signed)
 OUTPATIENT PHYSICAL THERAPY NEURO TREATMENT NOTE   Patient Name: Wesley [name] MRN: [id number] DOB:Apr 10, 1945, 78 y.o., male Today's Date: 04/03/2023   PCP: Shon Hale, MD REFERRING PROVIDER: Shon Hale, MD  END OF SESSION:  PT End of Session - 04/03/23 0754     Visit Number 2    Number of Visits 10    Date for PT Re-Evaluation 05/13/23    Authorization Type [employer]    Authorization Time Period auth required    Progress Note Due on Visit 10    PT Start Time 0800    PT Stop Time 0843    PT Time Calculation (min) 43 min    Activity Tolerance Patient tolerated treatment well    Behavior During Therapy WFL for tasks assessed/performed              Past Medical History:  Diagnosis Date   Depression    Diverticulosis    Hyperlipidemia    Hypertension    Migraine    OSA on CPAP    mild obstructive sleep apnea with an AHI of 8 and O2 saturations as low as 85%.  On CPAP 10cm H2O   Past Surgical History:  Procedure Laterality Date   ATRIAL FIBRILLATION ABLATION N/A 10/17/2022   Procedure: ATRIAL FIBRILLATION ABLATION;  Surgeon: [name], MD;  Location: [facility] INVASIVE CV LAB;  Service: Cardiovascular;  Laterality: N/A;   CARDIOVERSION N/A 05/24/2021   Procedure: CARDIOVERSION;  Surgeon: Little Ishikawa, MD;  Location: [facility] ENDOSCOPY;  Service: Cardiovascular;  Laterality: N/A;   CARDIOVERSION N/A 07/31/2022   Procedure: CARDIOVERSION;  Surgeon: Thomasene Ripple, DO;  Location: [facility] INVASIVE CV LAB;  Service: Cardiovascular;  Laterality: N/A;   CYSTOSCOPY WITH INSERTION OF UROLIFT N/A 04/28/2019   Procedure: CYSTOSCOPY WITH INSERTION OF UROLIFT;  Surgeon: Jerilee Field, MD;  Location: [facility];  Service: Urology;  Laterality: N/A;   GANGLION CYST EXCISION     PROSTATE BIOPSY N/A 04/28/2019   Procedure: BIOPSY TRANSRECTAL ULTRASONIC PROSTATE (TUBP);  Surgeon: Jerilee Field, MD;  Location: [facility];   Service: Urology;  Laterality: N/A;   Patient Active Problem List   Diagnosis Date Noted   Hypercoagulable state due to persistent atrial fibrillation (HCC) 08/23/2022   OSA on CPAP 01/03/2022   Persistent atrial fibrillation (HCC)    Diverticulitis of colon with perforation 12/18/2020   Bruxism 05/07/2017   Laryngopharyngeal reflux (LPR) 05/07/2017   Presbycusis of both ears 05/07/2017   Hemorrhoids, external, thrombosed 02/13/2012    ONSET DATE: worse over past 6 months  REFERRING DIAG:  R29.818 (ICD-10-CM) - Other symptoms and signs involving the nervous system    THERAPY DIAG:  Unsteadiness on feet  Other abnormalities of gait and mobility  Rationale for Evaluation and Treatment: Rehabilitation  SUBJECTIVE:  SUBJECTIVE STATEMENT: Have an appt with neurologist in June.  Balance/equilibrium is just off, even with sitting here. *Pt reports jagged handwriting, difficulty with using a knife, hastening gait, where his legs moved faster than the rest of his body, in movie theatre several weeks ago. Pt accompanied by: self  PERTINENT HISTORY: he has had episodes of feeling unsteady while walking. he was trying to keep up at the theater while walking and almost fell (his son caught him). sometimes feels lightheaded. he feels not strong overall.  +shuffling gait for years his wife says.  Reports he received cortizone shots to bilat knees.   PAIN:  Are you having pain? No  PRECAUTIONS: Fall  RED FLAGS: None   WEIGHT BEARING RESTRICTIONS: No  FALLS: Has patient fallen in last 6 months? Yes. Number of falls falls 1x/wk  LIVING ENVIRONMENT: Lives with: lives with their spouse Lives in: House/apartment Stairs: Yes Has following equipment at home: None  PLOF: Independent  PATIENT GOALS:  improve symptoms/balance  OBJECTIVE:    TODAY'S TREATMENT: 04/03/2023 Activity Comments  Review of HEP Good return demo, but mild>mod sway  Standing symptoms 3/10 dizziness (at baseline)  Standing feet apart gaze stabilization-horizontal x 30 sec, vertical x 30 sec 3-4/10 symptoms             VESTIBULAR ASSESSMENT   GENERAL OBSERVATION: Slowed, guarded mobility    SYMPTOM BEHAVIOR:   Subjective history: See PMH above.  Falls about 1x/wk, reports dizziness/imbalance going on at least 1 year, worsening since PT discharge in September 2024.  Has hx of A-fib, hx of migraines; no hx of positional vertigo.   Non-Vestibular symptoms: tinnitus   Type of dizziness: Imbalance (Disequilibrium), Unsteady with head/body turns, and Lightheadedness/Faint   Frequency: constant   Duration: constant   Aggravating factors: Induced by position change: sit to stand and Induced by motion: looking up at the ceiling, turning body quickly, turning head quickly, and closing eyes   Relieving factors: head stationary and slow movements   Progression of symptoms: worse   OCULOMOTOR EXAM:  Wears progressive lenses   Ocular Alignment:  R eye  slightly elevated and adducts with motion   Ocular ROM:  R eye adducts faster with L horizontal gaze   Spontaneous Nystagmus: absent   Gaze-Induced Nystagmus: absent   Smooth Pursuits:  mild saccadic movements with vertical motion   Saccades: intact   Convergence/Divergence: 9 cm    VESTIBULAR - OCULAR REFLEX:    Slow VOR: Comment: difficult to coordinate horizontal   VOR Cancellation: Normal   Head-Impulse Test: HIT Right: negative HIT Left: negative   Dynamic Visual Acuity: Static: Line 8 Dynamic: Line 5    POSITIONAL TESTING:  Right Roll Test: Negative; Duration: NA Left Roll Test: Negative; Duration: NA Right Dix-Hallpike:  Negative, initial dizziness upon sitting up and resolves within sec ; Duration:NA Left Dix-Hallpike: Negative; Duration:  NA     PATIENT EDUCATION: Education details: rationale for vestibular assessment/findings, ways to progress exercises to treat; ultimate plan to treat functional deficits in regards to balance, dizziness Person educated: Patient Education method: Explanation, Demonstration, and Verbal cues Education comprehension: verbalized understanding  ----------------------------------------------------------- Note: Objective measures were completed at Evaluation unless otherwise noted.  Vitals: 148/86 mmHg, 76 bpm  DIAGNOSTIC FINDINGS:   COGNITION: Overall cognitive status: Within functional limits for tasks assessed, reports some increased difficulty with memory and word finding   SENSATION: Not tested, reports able to  COORDINATION: WNL  EDEMA:  none  MUSCLE TONE:  NT  MUSCLE LENGTH: WFL  DTRs: NT   POSTURE: forward head  LOWER EXTREMITY ROM:     Active  Right Eval Left Eval  Hip flexion WNL WNL  Hip extension    Hip abduction WNL WNL  Hip adduction    Hip internal rotation    Hip external rotation    Knee flexion WNL WNL  Knee extension WNL WNL  Ankle dorsiflexion WNL WNL  Ankle plantarflexion WNL WNL  Ankle inversion    Ankle eversion     (Blank rows = not tested)  LOWER EXTREMITY MMT:  resisted tests in sitting  MMT Right Eval Left Eval  Hip flexion 5 5  Hip extension    Hip abduction 4 4  Hip adduction 5 5  Hip internal rotation    Hip external rotation    Knee flexion 5 5  Knee extension 5 5  Ankle dorsiflexion 5 5  Ankle plantarflexion    Ankle inversion    Ankle eversion    (Blank rows = not tested)  --very pronounced knee crepitus during manual muscle testing  BED MOBILITY:  indep  TRANSFERS: Assistive device utilized: None  Sit to stand: Complete Independence Stand to sit: Complete Independence Chair to chair: Complete Independence Floor: NT    CURB:  Level of Assistance: Complete Independence Assistive device utilized:  None Curb Comments:   STAIRS: Level of Assistance: Complete Independence and Modified independence Stair Negotiation Technique: Alternating Pattern  with No Rails Single Rail on Right Number of Stairs:   Height of Stairs:   Comments:   GAIT: Gait pattern: WFL Distance walked:  Assistive device utilized: None Level of assistance: Complete Independence, decreased speed Comments: instances of shuffling/right toe catch  FUNCTIONAL TESTS:  5 times sit to stand: 17.41 sec Functional gait assessment: 18/30 Gait speed: 1.9 ft sec  M-CTSIB  Condition 1: Firm Surface, EO 30 Sec, Normal Sway  Condition 2: Firm Surface, EC 30 Sec, Moderate Sway  Condition 3: Foam Surface, EO 30 Sec, Normal and Mild Sway  Condition 4: Foam Surface, EC 30 Sec, Moderate and Severe Sway                                                                                                                                    TREATMENT DATE: 03/25/23    PATIENT EDUCATION: Education details: assessment details, rationale for further assessment, demonstrated method for assessing orthostatic hypotension,  HEP initiation Person educated: Patient Education method: Explanation and Handouts Education comprehension: needs further education  HOME EXERCISE PROGRAM: Access Code: [email or web address] URL: [email or web address] Date: 03/25/2023 Prepared by: Shary Decamp  Exercises - Corner Balance Feet Together With Eyes Closed  - 1 x daily - 7 x weekly - 3 sets - 30 sec hold - Corner Balance Feet Together: Eyes Open With Head Turns  - 1 x daily - 7 x weekly - 3 sets - 3 reps - Corner Balance Feet Together: Eyes  Closed With Head Turns  - 1 x daily - 7 x weekly - 3 sets - 3 reps  GOALS: Goals reviewed with patient? Yes  SHORT TERM GOALS: Target date: 04/15/2023    Patient will be independent in HEP to improve functional outcomes Baseline: Goal status: IN PROGRESS  2.  Demo improved BLE strength and reduced risk  for falls per time < 15 sec 5xSTS test Baseline: 17 sec Goal status: IN PROGRESS  3.  Demo improved postural stability/awareness per mild sway x 30 sec condition 4 M-CTSIB to improve safety with ADL Baseline: mod-severe x 30 sec Goal status: IN PROGRESS    LONG TERM GOALS: Target date: 05/13/23   Demonstrate improved balance and reduce risk for falls per score 25/30 Functional Gait Assessment Baseline: 18/30 Goal status: IN PROGRESS  2.  Independent in advanced HEP for improved carryover and max benefit at D/C (gym-based strength, balance class, aquatic exercise, etc) Baseline:  Goal status: IN PROGRESS  3.  Demo ability to maintain ambulation speed 2.8 ft/sec to improve efficiency of community mobility Baseline: 1.9 ft/sec Goal status: IN PROGRESS    ASSESSMENT:  CLINICAL IMPRESSION: Pt presents today with no new reports. Skilled PT session focused on vestibular assessment and review of HEP. Pt noted to have R eye slightly higher than L eye and R eye adducts and pulls up with looking to L.  He has mild saccadic movements with smooth pursuits vertically; he does not have any symptoms with these.  He has slowed VOR with difficulty coordinating; he has 9 cm convergence distance.  Overall, he reports seated activities do not increase dizziness; rather, it is more in standing.  Standing review of HEP-pt has mild/mod sway.  Standing activities for balance and with VOR don't raise symptoms of dizziness above 3-4/10.  Pt will continue to benefit from skilled PT towards goals for improved functional mobility and decreased fall risk.   OBJECTIVE IMPAIRMENTS: Abnormal gait, decreased activity tolerance, decreased balance, decreased knowledge of use of DME, difficulty walking, decreased strength, dizziness, impaired perceived functional ability, and pain.   ACTIVITY LIMITATIONS: carrying, lifting, bending, squatting, stairs, transfers, and locomotion level  PARTICIPATION LIMITATIONS:  cleaning, interpersonal relationship, shopping, community activity, occupation, and exercise class participation  PERSONAL FACTORS: Age, Time since onset of injury/illness/exacerbation, and 3+ comorbidities: PMH  are also affecting patient's functional outcome.   REHAB POTENTIAL: Good  CLINICAL DECISION MAKING: Evolving/moderate complexity  EVALUATION COMPLEXITY: Moderate  PLAN:  PT FREQUENCY: 1-2x/week  PT DURATION: 6 weeks  PLANNED INTERVENTIONS: 97110-Therapeutic exercises, 97530- Therapeutic activity, [id number]- Neuromuscular re-education, 97535- Self Care, [id number]- Manual therapy, [phone]- Gait training, [phone]- Canalith repositioning, [phone]- Aquatic Therapy, Patient/Family education, Balance training, Stair training, Taping, Dry Needling, Joint mobilization, Spinal mobilization, Vestibular training, and DME instructions  PLAN FOR NEXT SESSION: Check orthostatic BP measures.   HEP review and progressions for balance   Lonia Blood, PT 04/03/23 12:02 PM Phone: [phone] Fax: [phone]  [facility] [address], [address] [place], [place] [address] Phone # [phone] Fax # [phone]    Referring diagnosis?  R29.818 (ICD-10-CM) - Other symptoms and signs involving the nervous system   Treatment diagnosis? (if different than referring diagnosis) Unsteadiness on feet  Difficulty in walking, not elsewhere classified  Other abnormalities of gait and mobility  What was this (referring dx) caused by? []  Surgery []  Fall [x]  Ongoing issue []  Arthritis []  Other: ____________  Laterality: []  Rt []  Lt [x]  Both  Check all possible CPT codes:  *CHOOSE 10 OR LESS*    See Planned Interventions listed in the Plan section of the Evaluation.

## 2023-04-05 ENCOUNTER — Ambulatory Visit

## 2023-04-05 DIAGNOSIS — M6281 Muscle weakness (generalized): Secondary | ICD-10-CM | POA: Diagnosis not present

## 2023-04-05 DIAGNOSIS — R2681 Unsteadiness on feet: Secondary | ICD-10-CM

## 2023-04-05 DIAGNOSIS — R262 Difficulty in walking, not elsewhere classified: Secondary | ICD-10-CM | POA: Diagnosis not present

## 2023-04-05 DIAGNOSIS — R2689 Other abnormalities of gait and mobility: Secondary | ICD-10-CM

## 2023-04-05 NOTE — Therapy (Signed)
 OUTPATIENT PHYSICAL THERAPY NEURO TREATMENT NOTE   Patient Name: Wesley [name] MRN: [id number] DOB:1945-09-28, 78 y.o., male Today's Date: 04/05/2023   PCP: Shon Hale, MD REFERRING PROVIDER: Shon Hale, MD  END OF SESSION:  PT End of Session - 04/05/23 0927     Visit Number 3    Number of Visits 10    Date for PT Re-Evaluation 05/13/23    Authorization Type [employer]    Authorization Time Period auth required    Progress Note Due on Visit 10    PT Start Time 0930    PT Stop Time 1015    PT Time Calculation (min) 45 min    Activity Tolerance Patient tolerated treatment well    Behavior During Therapy WFL for tasks assessed/performed              Past Medical History:  Diagnosis Date   Depression    Diverticulosis    Hyperlipidemia    Hypertension    Migraine    OSA on CPAP    mild obstructive sleep apnea with an AHI of 8 and O2 saturations as low as 85%.  On CPAP 10cm H2O   Past Surgical History:  Procedure Laterality Date   ATRIAL FIBRILLATION ABLATION N/A 10/17/2022   Procedure: ATRIAL FIBRILLATION ABLATION;  Surgeon: [name], MD;  Location: [facility] INVASIVE CV LAB;  Service: Cardiovascular;  Laterality: N/A;   CARDIOVERSION N/A 05/24/2021   Procedure: CARDIOVERSION;  Surgeon: Little Ishikawa, MD;  Location: [facility] ENDOSCOPY;  Service: Cardiovascular;  Laterality: N/A;   CARDIOVERSION N/A 07/31/2022   Procedure: CARDIOVERSION;  Surgeon: Thomasene Ripple, DO;  Location: [facility] INVASIVE CV LAB;  Service: Cardiovascular;  Laterality: N/A;   CYSTOSCOPY WITH INSERTION OF UROLIFT N/A 04/28/2019   Procedure: CYSTOSCOPY WITH INSERTION OF UROLIFT;  Surgeon: Jerilee Field, MD;  Location: [facility];  Service: Urology;  Laterality: N/A;   GANGLION CYST EXCISION     PROSTATE BIOPSY N/A 04/28/2019   Procedure: BIOPSY TRANSRECTAL ULTRASONIC PROSTATE (TUBP);  Surgeon: Jerilee Field, MD;  Location: [facility];   Service: Urology;  Laterality: N/A;   Patient Active Problem List   Diagnosis Date Noted   Hypercoagulable state due to persistent atrial fibrillation (HCC) 08/23/2022   OSA on CPAP 01/03/2022   Persistent atrial fibrillation (HCC)    Diverticulitis of colon with perforation 12/18/2020   Bruxism 05/07/2017   Laryngopharyngeal reflux (LPR) 05/07/2017   Presbycusis of both ears 05/07/2017   Hemorrhoids, external, thrombosed 02/13/2012    ONSET DATE: worse over past 6 months  REFERRING DIAG:  R29.818 (ICD-10-CM) - Other symptoms and signs involving the nervous system    THERAPY DIAG:  Unsteadiness on feet  Other abnormalities of gait and mobility  Difficulty in walking, not elsewhere classified  Muscle weakness (generalized)  Balance problem  Rationale for Evaluation and Treatment: Rehabilitation  SUBJECTIVE:  SUBJECTIVE STATEMENT: Having a little lightheaded/dizzy with first arising from sitting last 2-5 sec Pt accompanied by: self  PERTINENT HISTORY: he has had episodes of feeling unsteady while walking. he was trying to keep up at the theater while walking and almost fell (his son caught him). sometimes feels lightheaded. he feels not strong overall.  +shuffling gait for years his wife says.  Reports he received cortizone shots to bilat knees.   PAIN:  Are you having pain? No  PRECAUTIONS: Fall  RED FLAGS: None   WEIGHT BEARING RESTRICTIONS: No  FALLS: Has patient fallen in last 6 months? Yes. Number of falls falls 1x/wk  LIVING ENVIRONMENT: Lives with: lives with their spouse Lives in: House/apartment Stairs: Yes Has following equipment at home: None  PLOF: Independent  PATIENT GOALS: improve symptoms/balance  OBJECTIVE:   TODAY'S TREATMENT: 04/05/23 Activity Comments   orthostatics Dizziness with initial arising for just a few seconds  M-CTSIB Condition 1: normal Condition 2: mild Condition 3:normal Condition 4: mod-severe  VOR x 1 x 30 sec Horizontal: no issues Vertical: no issues  Fast, alternating pivot turns 2x10, greater issues with turning left   Semi-tandem + bicep curls 3x5 reps 8#  Narrow BOS pass around the body w/ dumbell eyes open/closed   Sit to stand 1x10 8# goblet eyes open/closed    OTHOSTATICS: Supine x 5: 134/84 with HR 66 Standing x 1 min: 137/86 with HR 73 Standing x 3 min: 137/88 with HR 75   TODAY'S TREATMENT: 04/03/2023 Activity Comments  Review of HEP Good return demo, but mild>mod sway  Standing symptoms 3/10 dizziness (at baseline)  Standing feet apart gaze stabilization-horizontal x 30 sec, vertical x 30 sec 3-4/10 symptoms             VESTIBULAR ASSESSMENT   GENERAL OBSERVATION: Slowed, guarded mobility    SYMPTOM BEHAVIOR:   Subjective history: See PMH above.  Falls about 1x/wk, reports dizziness/imbalance going on at least 1 year, worsening since PT discharge in September 2024.  Has hx of A-fib, hx of migraines; no hx of positional vertigo.   Non-Vestibular symptoms: tinnitus   Type of dizziness: Imbalance (Disequilibrium), Unsteady with head/body turns, and Lightheadedness/Faint   Frequency: constant   Duration: constant   Aggravating factors: Induced by position change: sit to stand and Induced by motion: looking up at the ceiling, turning body quickly, turning head quickly, and closing eyes   Relieving factors: head stationary and slow movements   Progression of symptoms: worse   OCULOMOTOR EXAM:  Wears progressive lenses   Ocular Alignment:  R eye  slightly elevated and adducts with motion   Ocular ROM:  R eye adducts faster with L horizontal gaze   Spontaneous Nystagmus: absent   Gaze-Induced Nystagmus: absent   Smooth Pursuits:  mild saccadic movements with vertical motion   Saccades:  intact   Convergence/Divergence: 9 cm    VESTIBULAR - OCULAR REFLEX:    Slow VOR: Comment: difficult to coordinate horizontal   VOR Cancellation: Normal   Head-Impulse Test: HIT Right: negative HIT Left: negative   Dynamic Visual Acuity: Static: Line 8 Dynamic: Line 5    POSITIONAL TESTING:  Right Roll Test: Negative; Duration: NA Left Roll Test: Negative; Duration: NA Right Dix-Hallpike:  Negative, initial dizziness upon sitting up and resolves within sec ; Duration:NA Left Dix-Hallpike: Negative; Duration: NA     PATIENT EDUCATION: Education details: rationale for vestibular assessment/findings, ways to progress exercises to treat; ultimate plan to treat functional deficits in regards  to balance, dizziness Person educated: Patient Education method: Explanation, Demonstration, and Verbal cues Education comprehension: verbalized understanding  ----------------------------------------------------------- Note: Objective measures were completed at Evaluation unless otherwise noted.  Vitals: 148/86 mmHg, 76 bpm  DIAGNOSTIC FINDINGS:   COGNITION: Overall cognitive status: Within functional limits for tasks assessed, reports some increased difficulty with memory and word finding   SENSATION: Not tested, reports able to  COORDINATION: WNL  EDEMA:  none  MUSCLE TONE:  NT  MUSCLE LENGTH: WFL  DTRs: NT   POSTURE: forward head  LOWER EXTREMITY ROM:     Active  Right Eval Left Eval  Hip flexion WNL WNL  Hip extension    Hip abduction WNL WNL  Hip adduction    Hip internal rotation    Hip external rotation    Knee flexion WNL WNL  Knee extension WNL WNL  Ankle dorsiflexion WNL WNL  Ankle plantarflexion WNL WNL  Ankle inversion    Ankle eversion     (Blank rows = not tested)  LOWER EXTREMITY MMT:  resisted tests in sitting  MMT Right Eval Left Eval  Hip flexion 5 5  Hip extension    Hip abduction 4 4  Hip adduction 5 5  Hip internal rotation    Hip  external rotation    Knee flexion 5 5  Knee extension 5 5  Ankle dorsiflexion 5 5  Ankle plantarflexion    Ankle inversion    Ankle eversion    (Blank rows = not tested)  --very pronounced knee crepitus during manual muscle testing  BED MOBILITY:  indep  TRANSFERS: Assistive device utilized: None  Sit to stand: Complete Independence Stand to sit: Complete Independence Chair to chair: Complete Independence Floor: NT    CURB:  Level of Assistance: Complete Independence Assistive device utilized: None Curb Comments:   STAIRS: Level of Assistance: Complete Independence and Modified independence Stair Negotiation Technique: Alternating Pattern  with No Rails Single Rail on Right Number of Stairs:   Height of Stairs:   Comments:   GAIT: Gait pattern: WFL Distance walked:  Assistive device utilized: None Level of assistance: Complete Independence, decreased speed Comments: instances of shuffling/right toe catch  FUNCTIONAL TESTS:  5 times sit to stand: 17.41 sec Functional gait assessment: 18/30 Gait speed: 1.9 ft sec  M-CTSIB  Condition 1: Firm Surface, EO 30 Sec, Normal Sway  Condition 2: Firm Surface, EC 30 Sec, Moderate Sway  Condition 3: Foam Surface, EO 30 Sec, Normal and Mild Sway  Condition 4: Foam Surface, EC 30 Sec, Moderate and Severe Sway                                                                                                                                    TREATMENT DATE: 03/25/23    PATIENT EDUCATION: Education details: assessment details, rationale for further assessment, demonstrated method for assessing orthostatic hypotension,  HEP initiation Person educated: Patient Education method:  Explanation and Handouts Education comprehension: needs further education  HOME EXERCISE PROGRAM: Access Code: [email or web address] URL: [email or web address] Date: 03/25/2023 Prepared by: Shary Decamp  Exercises - Corner Balance Feet  Together With Eyes Closed  - 1 x daily - 7 x weekly - 3 sets - 30 sec hold - Corner Balance Feet Together: Eyes Open With Head Turns  - 1 x daily - 7 x weekly - 3 sets - 3 reps - Corner Balance Feet Together: Eyes Closed With Head Turns  - 1 x daily - 7 x weekly - 3 sets - 3 reps - Semi-Tandem Balance at [employer] Eyes Open  - 1 x daily - 7 x weekly - 3 sets - 10 reps - Sit to Stand with Eyes Closed  - 1 x daily - 7 x weekly - 3 sets - 10 reps  GOALS: Goals reviewed with patient? Yes  SHORT TERM GOALS: Target date: 04/15/2023    Patient will be independent in HEP to improve functional outcomes Baseline: Goal status: IN PROGRESS  2.  Demo improved BLE strength and reduced risk for falls per time < 15 sec 5xSTS test Baseline: 17 sec Goal status: IN PROGRESS  3.  Demo improved postural stability/awareness per mild sway x 30 sec condition 4 M-CTSIB to improve safety with ADL Baseline: mod-severe x 30 sec Goal status: IN PROGRESS    LONG TERM GOALS: Target date: 05/13/23   Demonstrate improved balance and reduce risk for falls per score 25/30 Functional Gait Assessment Baseline: 18/30 Goal status: IN PROGRESS  2.  Independent in advanced HEP for improved carryover and max benefit at D/C (gym-based strength, balance class, aquatic exercise, etc) Baseline:  Goal status: IN PROGRESS  3.  Demo ability to maintain ambulation speed 2.8 ft/sec to improve efficiency of community mobility Baseline: 1.9 ft/sec Goal status: IN PROGRESS    ASSESSMENT:  CLINICAL IMPRESSION: Initiated with assessment for orthostatic hypotension which was unrevealing for any significant change and pt experiencing transient dizziness/lightheaded with arising initially which dissipates in 2-5 seconds.  Condition 4 M-CTSIB revealing for moderate-severe sway x 30 sec but without LOB.  Trials of repeated turns for faster, alternating pattern with greater difficulty with turning left.  Pt reports working with  trainer at gym and instructed in several techniques to incorporate static balance demands with typical weight lifting activities to improve postural stability and awareness under these demands. Continued sessions to progress POC details to improve mobility, balance and reduce fall risk  OBJECTIVE IMPAIRMENTS: Abnormal gait, decreased activity tolerance, decreased balance, decreased knowledge of use of DME, difficulty walking, decreased strength, dizziness, impaired perceived functional ability, and pain.   ACTIVITY LIMITATIONS: carrying, lifting, bending, squatting, stairs, transfers, and locomotion level  PARTICIPATION LIMITATIONS: cleaning, interpersonal relationship, shopping, community activity, occupation, and exercise class participation  PERSONAL FACTORS: Age, Time since onset of injury/illness/exacerbation, and 3+ comorbidities: PMH  are also affecting patient's functional outcome.   REHAB POTENTIAL: Good  CLINICAL DECISION MAKING: Evolving/moderate complexity  EVALUATION COMPLEXITY: Moderate  PLAN:  PT FREQUENCY: 1-2x/week  PT DURATION: 6 weeks  PLANNED INTERVENTIONS: 97110-Therapeutic exercises, 97530- Therapeutic activity, [id number]- Neuromuscular re-education, 97535- Self Care, [id number]- Manual therapy, [phone]- Gait training, [phone]- Canalith repositioning, [phone]- Aquatic Therapy, Patient/Family education, Balance training, Stair training, Taping, Dry Needling, Joint mobilization, Spinal mobilization, Vestibular training, and DME instructions  PLAN FOR NEXT SESSION: Check orthostatic BP measures.   HEP review and progressions for balance   12:00 PM, 04/05/23 M. Shary Decamp, PT, DPT Physical Therapist- [place] Office  Number: [phone]

## 2023-04-09 ENCOUNTER — Ambulatory Visit

## 2023-04-10 DIAGNOSIS — M170 Bilateral primary osteoarthritis of knee: Secondary | ICD-10-CM | POA: Diagnosis not present

## 2023-04-10 NOTE — Therapy (Signed)
 OUTPATIENT PHYSICAL THERAPY NEURO TREATMENT NOTE   Patient Name: Wesley [name] MRN: [id number] DOB:February 15, 1945, 78 y.o., male Today's Date: 04/10/2023   PCP: Shon Hale, MD REFERRING PROVIDER: Shon Hale, MD  END OF SESSION:     Past Medical History:  Diagnosis Date   Depression    Diverticulosis    Hyperlipidemia    Hypertension    Migraine    OSA on CPAP    mild obstructive sleep apnea with an AHI of 8 and O2 saturations as low as 85%.  On CPAP 10cm H2O   Past Surgical History:  Procedure Laterality Date   ATRIAL FIBRILLATION ABLATION N/A 10/17/2022   Procedure: ATRIAL FIBRILLATION ABLATION;  Surgeon: [name], MD;  Location: [facility] INVASIVE CV LAB;  Service: Cardiovascular;  Laterality: N/A;   CARDIOVERSION N/A 05/24/2021   Procedure: CARDIOVERSION;  Surgeon: Little Ishikawa, MD;  Location: [facility] ENDOSCOPY;  Service: Cardiovascular;  Laterality: N/A;   CARDIOVERSION N/A 07/31/2022   Procedure: CARDIOVERSION;  Surgeon: Thomasene Ripple, DO;  Location: [facility] INVASIVE CV LAB;  Service: Cardiovascular;  Laterality: N/A;   CYSTOSCOPY WITH INSERTION OF UROLIFT N/A 04/28/2019   Procedure: CYSTOSCOPY WITH INSERTION OF UROLIFT;  Surgeon: Jerilee Field, MD;  Location: [facility];  Service: Urology;  Laterality: N/A;   GANGLION CYST EXCISION     PROSTATE BIOPSY N/A 04/28/2019   Procedure: BIOPSY TRANSRECTAL ULTRASONIC PROSTATE (TUBP);  Surgeon: Jerilee Field, MD;  Location: [facility];  Service: Urology;  Laterality: N/A;   Patient Active Problem List   Diagnosis Date Noted   Hypercoagulable state due to persistent atrial fibrillation (HCC) 08/23/2022   OSA on CPAP 01/03/2022   Persistent atrial fibrillation (HCC)    Diverticulitis of colon with perforation 12/18/2020   Bruxism 05/07/2017   Laryngopharyngeal reflux (LPR) 05/07/2017   Presbycusis of both ears 05/07/2017   Hemorrhoids, external, thrombosed 02/13/2012     ONSET DATE: worse over past 6 months  REFERRING DIAG:  R29.818 (ICD-10-CM) - Other symptoms and signs involving the nervous system    THERAPY DIAG:  No diagnosis found.  Rationale for Evaluation and Treatment: Rehabilitation  SUBJECTIVE:                                                                                                                                                                                             SUBJECTIVE STATEMENT: Having a little lightheaded/dizzy with first arising from sitting last 2-5 sec Pt accompanied by: self  PERTINENT HISTORY: he has had episodes of feeling unsteady while walking. he was trying to keep up at the theater while walking and  almost fell (his son caught him). sometimes feels lightheaded. he feels not strong overall.  +shuffling gait for years his wife says.  Reports he received cortizone shots to bilat knees.   PAIN:  Are you having pain? No  PRECAUTIONS: Fall  RED FLAGS: None   WEIGHT BEARING RESTRICTIONS: No  FALLS: Has patient fallen in last 6 months? Yes. Number of falls falls 1x/wk  LIVING ENVIRONMENT: Lives with: lives with their spouse Lives in: House/apartment Stairs: Yes Has following equipment at home: None  PLOF: Independent  PATIENT GOALS: improve symptoms/balance  OBJECTIVE:     TODAY'S TREATMENT: 04/11/23 Activity Comments                         TODAY'S TREATMENT: 04/05/23 Activity Comments  orthostatics Dizziness with initial arising for just a few seconds  M-CTSIB Condition 1: normal Condition 2: mild Condition 3:normal Condition 4: mod-severe  VOR x 1 x 30 sec Horizontal: no issues Vertical: no issues  Fast, alternating pivot turns 2x10, greater issues with turning left   Semi-tandem + bicep curls 3x5 reps 8#  Narrow BOS pass around the body w/ dumbell eyes open/closed   Sit to stand 1x10 8# goblet eyes open/closed    OTHOSTATICS: Supine x 5: 134/84 with HR 66 Standing x 1  min: 137/86 with HR 73 Standing x 3 min: 137/88 with HR 75   TODAY'S TREATMENT: 04/03/2023 Activity Comments  Review of HEP Good return demo, but mild>mod sway  Standing symptoms 3/10 dizziness (at baseline)  Standing feet apart gaze stabilization-horizontal x 30 sec, vertical x 30 sec 3-4/10 symptoms             VESTIBULAR ASSESSMENT   GENERAL OBSERVATION: Slowed, guarded mobility    SYMPTOM BEHAVIOR:   Subjective history: See PMH above.  Falls about 1x/wk, reports dizziness/imbalance going on at least 1 year, worsening since PT discharge in September 2024.  Has hx of A-fib, hx of migraines; no hx of positional vertigo.   Non-Vestibular symptoms: tinnitus   Type of dizziness: Imbalance (Disequilibrium), Unsteady with head/body turns, and Lightheadedness/Faint   Frequency: constant   Duration: constant   Aggravating factors: Induced by position change: sit to stand and Induced by motion: looking up at the ceiling, turning body quickly, turning head quickly, and closing eyes   Relieving factors: head stationary and slow movements   Progression of symptoms: worse   OCULOMOTOR EXAM:  Wears progressive lenses   Ocular Alignment:  R eye  slightly elevated and adducts with motion   Ocular ROM:  R eye adducts faster with L horizontal gaze   Spontaneous Nystagmus: absent   Gaze-Induced Nystagmus: absent   Smooth Pursuits:  mild saccadic movements with vertical motion   Saccades: intact   Convergence/Divergence: 9 cm    VESTIBULAR - OCULAR REFLEX:    Slow VOR: Comment: difficult to coordinate horizontal   VOR Cancellation: Normal   Head-Impulse Test: HIT Right: negative HIT Left: negative   Dynamic Visual Acuity: Static: Line 8 Dynamic: Line 5    POSITIONAL TESTING:  Right Roll Test: Negative; Duration: NA Left Roll Test: Negative; Duration: NA Right Dix-Hallpike:  Negative, initial dizziness upon sitting up and resolves within sec ; Duration:NA Left Dix-Hallpike: Negative;  Duration: NA     PATIENT EDUCATION: Education details: rationale for vestibular assessment/findings, ways to progress exercises to treat; ultimate plan to treat functional deficits in regards to balance, dizziness Person educated: Patient Education method: Explanation, Demonstration, and  Verbal cues Education comprehension: verbalized understanding  ----------------------------------------------------------- Note: Objective measures were completed at Evaluation unless otherwise noted.  Vitals: 148/86 mmHg, 76 bpm  DIAGNOSTIC FINDINGS:   COGNITION: Overall cognitive status: Within functional limits for tasks assessed, reports some increased difficulty with memory and word finding   SENSATION: Not tested, reports able to  COORDINATION: WNL  EDEMA:  none  MUSCLE TONE:  NT  MUSCLE LENGTH: WFL  DTRs: NT   POSTURE: forward head  LOWER EXTREMITY ROM:     Active  Right Eval Left Eval  Hip flexion WNL WNL  Hip extension    Hip abduction WNL WNL  Hip adduction    Hip internal rotation    Hip external rotation    Knee flexion WNL WNL  Knee extension WNL WNL  Ankle dorsiflexion WNL WNL  Ankle plantarflexion WNL WNL  Ankle inversion    Ankle eversion     (Blank rows = not tested)  LOWER EXTREMITY MMT:  resisted tests in sitting  MMT Right Eval Left Eval  Hip flexion 5 5  Hip extension    Hip abduction 4 4  Hip adduction 5 5  Hip internal rotation    Hip external rotation    Knee flexion 5 5  Knee extension 5 5  Ankle dorsiflexion 5 5  Ankle plantarflexion    Ankle inversion    Ankle eversion    (Blank rows = not tested)  --very pronounced knee crepitus during manual muscle testing  BED MOBILITY:  indep  TRANSFERS: Assistive device utilized: None  Sit to stand: Complete Independence Stand to sit: Complete Independence Chair to chair: Complete Independence Floor: NT    CURB:  Level of Assistance: Complete Independence Assistive device  utilized: None Curb Comments:   STAIRS: Level of Assistance: Complete Independence and Modified independence Stair Negotiation Technique: Alternating Pattern  with No Rails Single Rail on Right Number of Stairs:   Height of Stairs:   Comments:   GAIT: Gait pattern: WFL Distance walked:  Assistive device utilized: None Level of assistance: Complete Independence, decreased speed Comments: instances of shuffling/right toe catch  FUNCTIONAL TESTS:  5 times sit to stand: 17.41 sec Functional gait assessment: 18/30 Gait speed: 1.9 ft sec  M-CTSIB  Condition 1: Firm Surface, EO 30 Sec, Normal Sway  Condition 2: Firm Surface, EC 30 Sec, Moderate Sway  Condition 3: Foam Surface, EO 30 Sec, Normal and Mild Sway  Condition 4: Foam Surface, EC 30 Sec, Moderate and Severe Sway                                                                                                                                    TREATMENT DATE: 03/25/23    PATIENT EDUCATION: Education details: assessment details, rationale for further assessment, demonstrated method for assessing orthostatic hypotension,  HEP initiation Person educated: Patient Education method: Explanation and Handouts Education comprehension: needs further education  HOME EXERCISE  PROGRAM: Access Code: [email or web address] URL: [email or web address] Date: 03/25/2023 Prepared by: Shary Decamp  Exercises - Corner Balance Feet Together With Eyes Closed  - 1 x daily - 7 x weekly - 3 sets - 30 sec hold - Corner Balance Feet Together: Eyes Open With Head Turns  - 1 x daily - 7 x weekly - 3 sets - 3 reps - Corner Balance Feet Together: Eyes Closed With Head Turns  - 1 x daily - 7 x weekly - 3 sets - 3 reps - Semi-Tandem Balance at [employer] Eyes Open  - 1 x daily - 7 x weekly - 3 sets - 10 reps - Sit to Stand with Eyes Closed  - 1 x daily - 7 x weekly - 3 sets - 10 reps  GOALS: Goals reviewed with patient? Yes  SHORT TERM GOALS:  Target date: 04/15/2023    Patient will be independent in HEP to improve functional outcomes Baseline: Goal status: IN PROGRESS  2.  Demo improved BLE strength and reduced risk for falls per time < 15 sec 5xSTS test Baseline: 17 sec Goal status: IN PROGRESS  3.  Demo improved postural stability/awareness per mild sway x 30 sec condition 4 M-CTSIB to improve safety with ADL Baseline: mod-severe x 30 sec Goal status: IN PROGRESS    LONG TERM GOALS: Target date: 05/13/23   Demonstrate improved balance and reduce risk for falls per score 25/30 Functional Gait Assessment Baseline: 18/30 Goal status: IN PROGRESS  2.  Independent in advanced HEP for improved carryover and max benefit at D/C (gym-based strength, balance class, aquatic exercise, etc) Baseline:  Goal status: IN PROGRESS  3.  Demo ability to maintain ambulation speed 2.8 ft/sec to improve efficiency of community mobility Baseline: 1.9 ft/sec Goal status: IN PROGRESS    ASSESSMENT:  CLINICAL IMPRESSION: Initiated with assessment for orthostatic hypotension which was unrevealing for any significant change and pt experiencing transient dizziness/lightheaded with arising initially which dissipates in 2-5 seconds.  Condition 4 M-CTSIB revealing for moderate-severe sway x 30 sec but without LOB.  Trials of repeated turns for faster, alternating pattern with greater difficulty with turning left.  Pt reports working with trainer at gym and instructed in several techniques to incorporate static balance demands with typical weight lifting activities to improve postural stability and awareness under these demands. Continued sessions to progress POC details to improve mobility, balance and reduce fall risk  OBJECTIVE IMPAIRMENTS: Abnormal gait, decreased activity tolerance, decreased balance, decreased knowledge of use of DME, difficulty walking, decreased strength, dizziness, impaired perceived functional ability, and pain.    ACTIVITY LIMITATIONS: carrying, lifting, bending, squatting, stairs, transfers, and locomotion level  PARTICIPATION LIMITATIONS: cleaning, interpersonal relationship, shopping, community activity, occupation, and exercise class participation  PERSONAL FACTORS: Age, Time since onset of injury/illness/exacerbation, and 3+ comorbidities: PMH  are also affecting patient's functional outcome.   REHAB POTENTIAL: Good  CLINICAL DECISION MAKING: Evolving/moderate complexity  EVALUATION COMPLEXITY: Moderate  PLAN:  PT FREQUENCY: 1-2x/week  PT DURATION: 6 weeks  PLANNED INTERVENTIONS: 97110-Therapeutic exercises, 97530- Therapeutic activity, [id number]- Neuromuscular re-education, 97535- Self Care, [id number]- Manual therapy, [phone]- Gait training, [phone]- Canalith repositioning, [phone]- Aquatic Therapy, Patient/Family education, Balance training, Stair training, Taping, Dry Needling, Joint mobilization, Spinal mobilization, Vestibular training, and DME instructions  PLAN FOR NEXT SESSION: Check orthostatic BP measures.   HEP review and progressions for balance

## 2023-04-11 ENCOUNTER — Ambulatory Visit: Admitting: Physical Therapy

## 2023-04-11 ENCOUNTER — Encounter: Payer: Self-pay | Admitting: Physical Therapy

## 2023-04-11 DIAGNOSIS — R262 Difficulty in walking, not elsewhere classified: Secondary | ICD-10-CM

## 2023-04-11 DIAGNOSIS — M6281 Muscle weakness (generalized): Secondary | ICD-10-CM

## 2023-04-11 DIAGNOSIS — R2681 Unsteadiness on feet: Secondary | ICD-10-CM

## 2023-04-11 DIAGNOSIS — R2689 Other abnormalities of gait and mobility: Secondary | ICD-10-CM

## 2023-04-15 NOTE — Therapy (Signed)
 OUTPATIENT PHYSICAL THERAPY NEURO TREATMENT NOTE   Patient Name: Wesley [name] MRN: [id number] DOB:05-Aug-1945, 78 y.o., male Today's Date: 04/16/2023   PCP: Ransom Byers, MD REFERRING PROVIDER: Ransom Byers, MD  END OF SESSION:  PT End of Session - 04/16/23 0845     Visit Number 5    Number of Visits 10    Date for PT Re-Evaluation 05/13/23    Authorization Type [employer]    Authorization Time Period approved 10 PT visits from 03/25/2023-05/13/2023    Progress Note Due on Visit 10    PT Start Time 0804    PT Stop Time 0845    PT Time Calculation (min) 41 min    Equipment Utilized During Treatment Gait belt    Activity Tolerance Patient tolerated treatment well    Behavior During Therapy WFL for tasks assessed/performed                Past Medical History:  Diagnosis Date   Depression    Diverticulosis    Hyperlipidemia    Hypertension    Migraine    OSA on CPAP    mild obstructive sleep apnea with an AHI of 8 and O2 saturations as low as 85%.  On CPAP 10cm H2O   Past Surgical History:  Procedure Laterality Date   ATRIAL FIBRILLATION ABLATION N/A 10/17/2022   Procedure: ATRIAL FIBRILLATION ABLATION;  Surgeon: Ardeen Kohler, MD;  Location: [facility] INVASIVE CV LAB;  Service: Cardiovascular;  Laterality: N/A;   CARDIOVERSION N/A 05/24/2021   Procedure: CARDIOVERSION;  Surgeon: Wendie Hamburg, MD;  Location: [facility] ENDOSCOPY;  Service: Cardiovascular;  Laterality: N/A;   CARDIOVERSION N/A 07/31/2022   Procedure: CARDIOVERSION;  Surgeon: Jerryl Morin, DO;  Location: [facility] INVASIVE CV LAB;  Service: Cardiovascular;  Laterality: N/A;   CYSTOSCOPY WITH INSERTION OF UROLIFT N/A 04/28/2019   Procedure: CYSTOSCOPY WITH INSERTION OF UROLIFT;  Surgeon: Christina Coyer, MD;  Location: [facility];  Service: Urology;  Laterality: N/A;   GANGLION CYST EXCISION     PROSTATE BIOPSY N/A 04/28/2019   Procedure: BIOPSY TRANSRECTAL ULTRASONIC  PROSTATE (TUBP);  Surgeon: Christina Coyer, MD;  Location: [facility];  Service: Urology;  Laterality: N/A;   Patient Active Problem List   Diagnosis Date Noted   Hypercoagulable state due to persistent atrial fibrillation (HCC) 08/23/2022   OSA on CPAP 01/03/2022   Persistent atrial fibrillation (HCC)    Diverticulitis of colon with perforation 12/18/2020   Bruxism 05/07/2017   Laryngopharyngeal reflux (LPR) 05/07/2017   Presbycusis of both ears 05/07/2017   Hemorrhoids, external, thrombosed 02/13/2012    ONSET DATE: worse over past 6 months  REFERRING DIAG:  R29.818 (ICD-10-CM) - Other symptoms and signs involving the nervous system    THERAPY DIAG:  Unsteadiness on feet  Other abnormalities of gait and mobility  Difficulty in walking, not elsewhere classified  Muscle weakness (generalized)  Rationale for Evaluation and Treatment: Rehabilitation  SUBJECTIVE:  SUBJECTIVE STATEMENT: Did not lift foot high enough when climbing stairs and fell going up the stairs. Did not hurt myself.    Pt accompanied by: self  PERTINENT HISTORY: he has had episodes of feeling unsteady while walking. he was trying to keep up at the theater while walking and almost fell (his son caught him). sometimes feels lightheaded. he feels not strong overall.  +shuffling gait for years his wife says.  Reports he received cortizone shots to bilat knees.   PAIN:  Are you having pain? No  PRECAUTIONS: Fall  RED FLAGS: None   WEIGHT BEARING RESTRICTIONS: No  FALLS: Has patient fallen in last 6 months? Yes. Number of falls falls 1x/wk  LIVING ENVIRONMENT: Lives with: lives with their spouse Lives in: House/apartment Stairs: Yes Has following equipment at home: None  PLOF:  Independent  PATIENT GOALS: improve symptoms/balance  OBJECTIVE:     TODAY'S TREATMENT: 04/16/23 Activity Comments  Vitals at start of session  133/43mmHg, 78 bpm  bend to place cone on floor, forward step over it CGA; good tall steps; c/o mild imbalance; more challenge with L foot stepping   sidestepping onto/off foam Backwards/forwards step onto foam Occasional imbalance; CGA  figure 8 turns + ball toss Discontinuous stepping, limiting foot clearance   Gastroc stretch 30" Audible crepitus but nonpainful   alt step ups 6"with green medball at chest with overhead lift 2x10  Occasionally catching foot on step   Backwards walking + overhead/behind back with green medball  Short discontinuous steps  Romberg EC Standing EC + head turns/nods  Moderate sway   Walking EC along counter  Mild instability            HOME EXERCISE PROGRAM Last updated: 04/16/23 Access Code: [email or web address] URL: [email or web address] Date: 04/16/2023 Prepared by: [facility] - Outpatient  Rehab - [facility]  Exercises - Corner Balance Feet Together With Eyes Closed  - 1 x daily - 7 x weekly - 3 sets - 30 sec hold - Corner Balance Feet Together: Eyes Open With Head Turns  - 1 x daily - 7 x weekly - 3 sets - 3 reps - Corner Balance Feet Together: Eyes Closed With Head Turns  - 1 x daily - 7 x weekly - 3 sets - 3 reps - Semi-Tandem Balance at [employer] Eyes Open  - 1 x daily - 7 x weekly - 3 sets - 10 reps - Sit to Stand with Eyes Closed  - 1 x daily - 7 x weekly - 3 sets - 10 reps - Forward Step Up with Counter Support  - 1 x daily - 5 x weekly - 2 sets - 10 reps   PATIENT EDUCATION: Education details: HEP update Person educated: Patient Education method: Explanation, Demonstration, Tactile cues, Verbal cues, and Handouts Education comprehension: verbalized understanding and returned demonstration     Below measures were taken at time of initial evaluation unless otherwise  specified:    VESTIBULAR ASSESSMENT   GENERAL OBSERVATION: Slowed, guarded mobility    SYMPTOM BEHAVIOR:   Subjective history: See PMH above.  Falls about 1x/wk, reports dizziness/imbalance going on at least 1 year, worsening since PT discharge in September 2024.  Has hx of A-fib, hx of migraines; no hx of positional vertigo.   Non-Vestibular symptoms: tinnitus   Type of dizziness: Imbalance (Disequilibrium), Unsteady with head/body turns, and Lightheadedness/Faint   Frequency: constant   Duration: constant   Aggravating factors: Induced by position change: sit to stand and Induced by  motion: looking up at the ceiling, turning body quickly, turning head quickly, and closing eyes   Relieving factors: head stationary and slow movements   Progression of symptoms: worse   OCULOMOTOR EXAM:  Wears progressive lenses   Ocular Alignment:  R eye  slightly elevated and adducts with motion   Ocular ROM:  R eye adducts faster with L horizontal gaze   Spontaneous Nystagmus: absent   Gaze-Induced Nystagmus: absent   Smooth Pursuits:  mild saccadic movements with vertical motion   Saccades: intact   Convergence/Divergence: 9 cm    VESTIBULAR - OCULAR REFLEX:    Slow VOR: Comment: difficult to coordinate horizontal   VOR Cancellation: Normal   Head-Impulse Test: HIT Right: negative HIT Left: negative   Dynamic Visual Acuity: Static: Line 8 Dynamic: Line 5    POSITIONAL TESTING:  Right Roll Test: Negative; Duration: NA Left Roll Test: Negative; Duration: NA Right Dix-Hallpike:  Negative, initial dizziness upon sitting up and resolves within sec ; Duration:NA Left Dix-Hallpike: Negative; Duration: NA     PATIENT EDUCATION: Education details: rationale for vestibular assessment/findings, ways to progress exercises to treat; ultimate plan to treat functional deficits in regards to balance, dizziness Person educated: Patient Education method: Explanation, Demonstration, and Verbal  cues Education comprehension: verbalized understanding  ----------------------------------------------------------- Note: Objective measures were completed at Evaluation unless otherwise noted.  Vitals: 148/86 mmHg, 76 bpm  DIAGNOSTIC FINDINGS:   COGNITION: Overall cognitive status: Within functional limits for tasks assessed, reports some increased difficulty with memory and word finding   SENSATION: Not tested, reports able to  COORDINATION: WNL  EDEMA:  none  MUSCLE TONE:  NT  MUSCLE LENGTH: WFL  DTRs: NT   POSTURE: forward head  LOWER EXTREMITY ROM:     Active  Right Eval Left Eval  Hip flexion WNL WNL  Hip extension    Hip abduction WNL WNL  Hip adduction    Hip internal rotation    Hip external rotation    Knee flexion WNL WNL  Knee extension WNL WNL  Ankle dorsiflexion WNL WNL  Ankle plantarflexion WNL WNL  Ankle inversion    Ankle eversion     (Blank rows = not tested)  LOWER EXTREMITY MMT:  resisted tests in sitting  MMT Right Eval Left Eval  Hip flexion 5 5  Hip extension    Hip abduction 4 4  Hip adduction 5 5  Hip internal rotation    Hip external rotation    Knee flexion 5 5  Knee extension 5 5  Ankle dorsiflexion 5 5  Ankle plantarflexion    Ankle inversion    Ankle eversion    (Blank rows = not tested)  --very pronounced knee crepitus during manual muscle testing  BED MOBILITY:  indep  TRANSFERS: Assistive device utilized: None  Sit to stand: Complete Independence Stand to sit: Complete Independence Chair to chair: Complete Independence Floor: NT    CURB:  Level of Assistance: Complete Independence Assistive device utilized: None Curb Comments:   STAIRS: Level of Assistance: Complete Independence and Modified independence Stair Negotiation Technique: Alternating Pattern  with No Rails Single Rail on Right Number of Stairs:   Height of Stairs:   Comments:   GAIT: Gait pattern: WFL Distance walked:   Assistive device utilized: None Level of assistance: Complete Independence, decreased speed Comments: instances of shuffling/right toe catch  FUNCTIONAL TESTS:  5 times sit to stand: 17.41 sec Functional gait assessment: 18/30 Gait speed: 1.9 ft sec  M-CTSIB  Condition  1: Firm Surface, EO 30 Sec, Normal Sway  Condition 2: Firm Surface, EC 30 Sec, Moderate Sway  Condition 3: Foam Surface, EO 30 Sec, Normal and Mild Sway  Condition 4: Foam Surface, EC 30 Sec, Moderate and Severe Sway                                                                                                                                    TREATMENT DATE: 03/25/23    PATIENT EDUCATION: Education details: assessment details, rationale for further assessment, demonstrated method for assessing orthostatic hypotension,  HEP initiation Person educated: Patient Education method: Explanation and Handouts Education comprehension: needs further education  HOME EXERCISE PROGRAM: Access Code: [email or web address] URL: [email or web address] Date: 03/25/2023 Prepared by: Tedd Favorite  Exercises - Corner Balance Feet Together With Eyes Closed  - 1 x daily - 7 x weekly - 3 sets - 30 sec hold - Corner Balance Feet Together: Eyes Open With Head Turns  - 1 x daily - 7 x weekly - 3 sets - 3 reps - Corner Balance Feet Together: Eyes Closed With Head Turns  - 1 x daily - 7 x weekly - 3 sets - 3 reps - Semi-Tandem Balance at [employer] Eyes Open  - 1 x daily - 7 x weekly - 3 sets - 10 reps - Sit to Stand with Eyes Closed  - 1 x daily - 7 x weekly - 3 sets - 10 reps  GOALS: Goals reviewed with patient? Yes  SHORT TERM GOALS: Target date: 04/15/2023    Patient will be independent in HEP to improve functional outcomes Baseline: Goal status: IN PROGRESS  2.  Demo improved BLE strength and reduced risk for falls per time < 15 sec 5xSTS test Baseline: 17 sec Goal status: IN PROGRESS  3.  Demo improved postural  stability/awareness per mild sway x 30 sec condition 4 M-CTSIB to improve safety with ADL Baseline: mod-severe x 30 sec Goal status: IN PROGRESS    LONG TERM GOALS: Target date: 05/13/23   Demonstrate improved balance and reduce risk for falls per score 25/30 Functional Gait Assessment Baseline: 18/30 Goal status: IN PROGRESS  2.  Independent in advanced HEP for improved carryover and max benefit at D/C (gym-based strength, balance class, aquatic exercise, etc) Baseline:  Goal status: IN PROGRESS  3.  Demo ability to maintain ambulation speed 2.8 ft/sec to improve efficiency of community mobility Baseline: 1.9 ft/sec Goal status: IN PROGRESS    ASSESSMENT:  CLINICAL IMPRESSION: Patient arrived to session with report of a fall going up the stairs since last session; denies injury. Continued with balance challenges including bending and stepping over obstacles, steps, and dual tasking activities. Patient appeared most unsteady with step ups today and demonstrated difficulty maintaining normal gait pattern with dual tasking activities. Also demonstrated moderate sway with EC balance; need to continue working on this. No complaints upon leaving.    OBJECTIVE IMPAIRMENTS:  Abnormal gait, decreased activity tolerance, decreased balance, decreased knowledge of use of DME, difficulty walking, decreased strength, dizziness, impaired perceived functional ability, and pain.   ACTIVITY LIMITATIONS: carrying, lifting, bending, squatting, stairs, transfers, and locomotion level  PARTICIPATION LIMITATIONS: cleaning, interpersonal relationship, shopping, community activity, occupation, and exercise class participation  PERSONAL FACTORS: Age, Time since onset of injury/illness/exacerbation, and 3+ comorbidities: PMH  are also affecting patient's functional outcome.   REHAB POTENTIAL: Good  CLINICAL DECISION MAKING: Evolving/moderate complexity  EVALUATION COMPLEXITY: Moderate  PLAN:  PT  FREQUENCY: 1-2x/week  PT DURATION: 6 weeks  PLANNED INTERVENTIONS: 97110-Therapeutic exercises, 97530- Therapeutic activity, [id number]- Neuromuscular re-education, 97535- Self Care, 91478- Manual therapy, [phone]- Gait training, [phone]- Canalith repositioning, [phone]- Aquatic Therapy, Patient/Family education, Balance training, Stair training, Taping, Dry Needling, Joint mobilization, Spinal mobilization, Vestibular training, and DME instructions  PLAN FOR NEXT SESSION: Check orthostatic BP measures.   Dual tasking, EC balance     Thaddeus Filippo, [place], DPT 04/16/23 8:45 AM  [facility] at [facility] [address] [place], [address] [place], [place] [address] Phone # [phone] Fax # [phone]

## 2023-04-16 ENCOUNTER — Ambulatory Visit: Admitting: Physical Therapy

## 2023-04-16 ENCOUNTER — Encounter: Payer: Self-pay | Admitting: Physical Therapy

## 2023-04-16 DIAGNOSIS — M6281 Muscle weakness (generalized): Secondary | ICD-10-CM

## 2023-04-16 DIAGNOSIS — R2681 Unsteadiness on feet: Secondary | ICD-10-CM

## 2023-04-16 DIAGNOSIS — R262 Difficulty in walking, not elsewhere classified: Secondary | ICD-10-CM

## 2023-04-16 DIAGNOSIS — R2689 Other abnormalities of gait and mobility: Secondary | ICD-10-CM

## 2023-04-17 DIAGNOSIS — M170 Bilateral primary osteoarthritis of knee: Secondary | ICD-10-CM | POA: Diagnosis not present

## 2023-04-18 ENCOUNTER — Ambulatory Visit

## 2023-04-18 DIAGNOSIS — R2689 Other abnormalities of gait and mobility: Secondary | ICD-10-CM | POA: Diagnosis not present

## 2023-04-18 DIAGNOSIS — R262 Difficulty in walking, not elsewhere classified: Secondary | ICD-10-CM | POA: Diagnosis not present

## 2023-04-18 DIAGNOSIS — M6281 Muscle weakness (generalized): Secondary | ICD-10-CM | POA: Diagnosis not present

## 2023-04-18 DIAGNOSIS — R2681 Unsteadiness on feet: Secondary | ICD-10-CM

## 2023-04-18 NOTE — Therapy (Signed)
 OUTPATIENT PHYSICAL THERAPY NEURO TREATMENT NOTE   Patient Name: Wesley [name] MRN: [id number] DOB:1945-01-11, 78 y.o., male Today's Date: 04/18/2023   PCP: Ransom Byers, MD REFERRING PROVIDER: Ransom Byers, MD  END OF SESSION:  PT End of Session - 04/18/23 0753     Visit Number 6    Number of Visits 10    Date for PT Re-Evaluation 05/13/23    Authorization Type [employer]    Authorization Time Period approved 10 PT visits from 03/25/2023-05/13/2023    Progress Note Due on Visit 10    PT Start Time 0800    PT Stop Time 0845    PT Time Calculation (min) 45 min    Equipment Utilized During Treatment Gait belt    Activity Tolerance Patient tolerated treatment well    Behavior During Therapy WFL for tasks assessed/performed                Past Medical History:  Diagnosis Date   Depression    Diverticulosis    Hyperlipidemia    Hypertension    Migraine    OSA on CPAP    mild obstructive sleep apnea with an AHI of 8 and O2 saturations as low as 85%.  On CPAP 10cm H2O   Past Surgical History:  Procedure Laterality Date   ATRIAL FIBRILLATION ABLATION N/A 10/17/2022   Procedure: ATRIAL FIBRILLATION ABLATION;  Surgeon: Ardeen Kohler, MD;  Location: [facility] INVASIVE CV LAB;  Service: Cardiovascular;  Laterality: N/A;   CARDIOVERSION N/A 05/24/2021   Procedure: CARDIOVERSION;  Surgeon: Wendie Hamburg, MD;  Location: [facility] ENDOSCOPY;  Service: Cardiovascular;  Laterality: N/A;   CARDIOVERSION N/A 07/31/2022   Procedure: CARDIOVERSION;  Surgeon: Jerryl Morin, DO;  Location: [facility] INVASIVE CV LAB;  Service: Cardiovascular;  Laterality: N/A;   CYSTOSCOPY WITH INSERTION OF UROLIFT N/A 04/28/2019   Procedure: CYSTOSCOPY WITH INSERTION OF UROLIFT;  Surgeon: Christina Coyer, MD;  Location: [facility];  Service: Urology;  Laterality: N/A;   GANGLION CYST EXCISION     PROSTATE BIOPSY N/A 04/28/2019   Procedure: BIOPSY TRANSRECTAL ULTRASONIC  PROSTATE (TUBP);  Surgeon: Christina Coyer, MD;  Location: [facility];  Service: Urology;  Laterality: N/A;   Patient Active Problem List   Diagnosis Date Noted   Hypercoagulable state due to persistent atrial fibrillation (HCC) 08/23/2022   OSA on CPAP 01/03/2022   Persistent atrial fibrillation (HCC)    Diverticulitis of colon with perforation 12/18/2020   Bruxism 05/07/2017   Laryngopharyngeal reflux (LPR) 05/07/2017   Presbycusis of both ears 05/07/2017   Hemorrhoids, external, thrombosed 02/13/2012    ONSET DATE: worse over past 6 months  REFERRING DIAG:  R29.818 (ICD-10-CM) - Other symptoms and signs involving the nervous system    THERAPY DIAG:  Unsteadiness on feet  Other abnormalities of gait and mobility  Difficulty in walking, not elsewhere classified  Muscle weakness (generalized)  Rationale for Evaluation and Treatment: Rehabilitation  SUBJECTIVE:  SUBJECTIVE STATEMENT: Doing ok. Notes that when sitting x 30 minutes and then arise he feels lightheaded/dizzy x 1 min. Has been receiving Visco injections to knees and has additional injection coming up   Pt accompanied by: self  PERTINENT HISTORY: he has had episodes of feeling unsteady while walking. he was trying to keep up at the theater while walking and almost fell (his son caught him). sometimes feels lightheaded. he feels not strong overall.  +shuffling gait for years his wife says.  Reports he received cortizone shots to bilat knees.   PAIN:  Are you having pain? No  PRECAUTIONS: Fall  RED FLAGS: None   WEIGHT BEARING RESTRICTIONS: No  FALLS: Has patient fallen in last 6 months? Yes. Number of falls falls 1x/wk  LIVING ENVIRONMENT: Lives with: lives with their spouse Lives in:  House/apartment Stairs: Yes Has following equipment at home: None  PLOF: Independent  PATIENT GOALS: improve symptoms/balance  OBJECTIVE:   TODAY'S TREATMENT: 04/18/23 Activity Comments  Sit to stand 2x5 reps   5xSTS test 13.75 sec  M-CTSIB  Condition 3: normal-mild Condition 4: mild  Pivot-turns with dual-tasking              TODAY'S TREATMENT: 04/16/23 Activity Comments  Vitals at start of session  133/79mmHg, 78 bpm  bend to place cone on floor, forward step over it CGA; good tall steps; c/o mild imbalance; more challenge with L foot stepping   sidestepping onto/off foam Backwards/forwards step onto foam Occasional imbalance; CGA  figure 8 turns + ball toss Discontinuous stepping, limiting foot clearance   Gastroc stretch 30" Audible crepitus but nonpainful   alt step ups 6"with green medball at chest with overhead lift 2x10  Occasionally catching foot on step   Backwards walking + overhead/behind back with green medball  Short discontinuous steps  Romberg EC Standing EC + head turns/nods  Moderate sway   Walking EC along counter  Mild instability            HOME EXERCISE PROGRAM Last updated: 04/16/23 Access Code: [email or web address] URL: [email or web address] Date: 04/16/2023 Prepared by: [facility] - [facility]  Exercises - Corner Balance Feet Together With Eyes Closed  - 1 x daily - 7 x weekly - 3 sets - 30 sec hold - Corner Balance Feet Together: Eyes Open With Head Turns  - 1 x daily - 7 x weekly - 3 sets - 3 reps - Corner Balance Feet Together: Eyes Closed With Head Turns  - 1 x daily - 7 x weekly - 3 sets - 3 reps - Semi-Tandem Balance at [employer] Eyes Open  - 1 x daily - 7 x weekly - 3 sets - 10 reps - Sit to Stand with Eyes Closed  - 1 x daily - 7 x weekly - 3 sets - 10 reps - Forward Step Up with Counter Support  - 1 x daily - 5 x weekly - 2 sets - 10 reps   PATIENT EDUCATION: Education details: HEP update Person  educated: Patient Education method: Explanation, Demonstration, Tactile cues, Verbal cues, and Handouts Education comprehension: verbalized understanding and returned demonstration     Below measures were taken at time of initial evaluation unless otherwise specified:    VESTIBULAR ASSESSMENT   GENERAL OBSERVATION: Slowed, guarded mobility    SYMPTOM BEHAVIOR:   Subjective history: See PMH above.  Falls about 1x/wk, reports dizziness/imbalance going on at least 1 year, worsening since PT discharge in September 2024.  Has hx of A-fib, hx of migraines; no hx of positional vertigo.   Non-Vestibular symptoms: tinnitus   Type of dizziness: Imbalance (Disequilibrium), Unsteady with head/body turns, and Lightheadedness/Faint   Frequency: constant   Duration: constant   Aggravating factors: Induced by position change: sit to stand and Induced by motion: looking up at the ceiling, turning body quickly, turning head quickly, and closing eyes   Relieving factors: head stationary and slow movements   Progression of symptoms: worse   OCULOMOTOR EXAM:  Wears progressive lenses   Ocular Alignment:  R eye  slightly elevated and adducts with motion   Ocular ROM:  R eye adducts faster with L horizontal gaze   Spontaneous Nystagmus: absent   Gaze-Induced Nystagmus: absent   Smooth Pursuits:  mild saccadic movements with vertical motion   Saccades: intact   Convergence/Divergence: 9 cm    VESTIBULAR - OCULAR REFLEX:    Slow VOR: Comment: difficult to coordinate horizontal   VOR Cancellation: Normal   Head-Impulse Test: HIT Right: negative HIT Left: negative   Dynamic Visual Acuity: Static: Line 8 Dynamic: Line 5    POSITIONAL TESTING:  Right Roll Test: Negative; Duration: NA Left Roll Test: Negative; Duration: NA Right Dix-Hallpike:  Negative, initial dizziness upon sitting up and resolves within sec ; Duration:NA Left Dix-Hallpike: Negative; Duration: NA     PATIENT  EDUCATION: Education details: rationale for vestibular assessment/findings, ways to progress exercises to treat; ultimate plan to treat functional deficits in regards to balance, dizziness Person educated: Patient Education method: Explanation, Demonstration, and Verbal cues Education comprehension: verbalized understanding  ----------------------------------------------------------- Note: Objective measures were completed at Evaluation unless otherwise noted.  Vitals: 148/86 mmHg, 76 bpm  DIAGNOSTIC FINDINGS:   COGNITION: Overall cognitive status: Within functional limits for tasks assessed, reports some increased difficulty with memory and word finding   SENSATION: Not tested, reports able to  COORDINATION: WNL  EDEMA:  none  MUSCLE TONE:  NT  MUSCLE LENGTH: WFL  DTRs: NT   POSTURE: forward head  LOWER EXTREMITY ROM:     Active  Right Eval Left Eval  Hip flexion WNL WNL  Hip extension    Hip abduction WNL WNL  Hip adduction    Hip internal rotation    Hip external rotation    Knee flexion WNL WNL  Knee extension WNL WNL  Ankle dorsiflexion WNL WNL  Ankle plantarflexion WNL WNL  Ankle inversion    Ankle eversion     (Blank rows = not tested)  LOWER EXTREMITY MMT:  resisted tests in sitting  MMT Right Eval Left Eval  Hip flexion 5 5  Hip extension    Hip abduction 4 4  Hip adduction 5 5  Hip internal rotation    Hip external rotation    Knee flexion 5 5  Knee extension 5 5  Ankle dorsiflexion 5 5  Ankle plantarflexion    Ankle inversion    Ankle eversion    (Blank rows = not tested)  --very pronounced knee crepitus during manual muscle testing  BED MOBILITY:  indep  TRANSFERS: Assistive device utilized: None  Sit to stand: Complete Independence Stand to sit: Complete Independence Chair to chair: Complete Independence Floor: NT    CURB:  Level of Assistance: Complete Independence Assistive device utilized: None Curb Comments:    STAIRS: Level of Assistance: Complete Independence and Modified independence Stair Negotiation Technique: Alternating Pattern  with No Rails Single Rail on Right Number of Stairs:   Height of Stairs:  Comments:   GAIT: Gait pattern: WFL Distance walked:  Assistive device utilized: None Level of assistance: Complete Independence, decreased speed Comments: instances of shuffling/right toe catch  FUNCTIONAL TESTS:  5 times sit to stand: 17.41 sec Functional gait assessment: 18/30 Gait speed: 1.9 ft sec  M-CTSIB  Condition 1: Firm Surface, EO 30 Sec, Normal Sway  Condition 2: Firm Surface, EC 30 Sec, Moderate Sway  Condition 3: Foam Surface, EO 30 Sec, Normal and Mild Sway  Condition 4: Foam Surface, EC 30 Sec, Moderate and Severe Sway                                                                                                                                    TREATMENT DATE: 03/25/23    PATIENT EDUCATION: Education details: assessment details, rationale for further assessment, demonstrated method for assessing orthostatic hypotension,  HEP initiation Person educated: Patient Education method: Explanation and Handouts Education comprehension: needs further education  HOME EXERCISE PROGRAM: Access Code: [email or web address] URL: [email or web address] Date: 03/25/2023 Prepared by: Tedd Favorite  Exercises - Corner Balance Feet Together With Eyes Closed  - 1 x daily - 7 x weekly - 3 sets - 30 sec hold - Corner Balance Feet Together: Eyes Open With Head Turns  - 1 x daily - 7 x weekly - 3 sets - 3 reps - Corner Balance Feet Together: Eyes Closed With Head Turns  - 1 x daily - 7 x weekly - 3 sets - 3 reps - Semi-Tandem Balance at [employer] Eyes Open  - 1 x daily - 7 x weekly - 3 sets - 10 reps - Sit to Stand with Eyes Closed  - 1 x daily - 7 x weekly - 3 sets - 10 reps  GOALS: Goals reviewed with patient? Yes  SHORT TERM GOALS: Target date: 04/15/2023     Patient will be independent in HEP to improve functional outcomes Baseline: Goal status: MET  2.  Demo improved BLE strength and reduced risk for falls per time < 15 sec 5xSTS test Baseline: 17 sec; (04/18/23) 13.75 sec Goal status: MET  3.  Demo improved postural stability/awareness per mild sway x 30 sec condition 4 M-CTSIB to improve safety with ADL Baseline: mod-severe x 30 sec; (04/18/23) mild sway x 30 sec Goal status: MET    LONG TERM GOALS: Target date: 05/13/23   Demonstrate improved balance and reduce risk for falls per score 25/30 Functional Gait Assessment Baseline: 18/30 Goal status: IN PROGRESS  2.  Independent in advanced HEP for improved carryover and max benefit at D/C (gym-based strength, balance class, aquatic exercise, etc) Baseline:  Goal status: IN PROGRESS  3.  Demo ability to maintain ambulation speed 2.8 ft/sec to improve efficiency of community mobility Baseline: 1.9 ft/sec Goal status: IN PROGRESS    ASSESSMENT:  CLINICAL IMPRESSION: Addressed STG with improved performance 5xSTS test with tim 13 sec indicating low risk for falls/improved BLE  strength.  Improved stability under condition 4 M-CTSIB with mild sway x 30 sec observed.  Demo good recall to HEP and notes these to be an adequate challenge.  Instructed in dual-tasking activities for added challenge and to improve mobility and reduce disruption to motor activity with demands.  Increased difficulty with dual-tasking as he required stopping of task 50% of the time when processing cognitive challenge.  Provided with list of various dual-tasking strategies to implement with HEP and with trainer if so desired. Continued sessions to meet LTG objectives.   OBJECTIVE IMPAIRMENTS: Abnormal gait, decreased activity tolerance, decreased balance, decreased knowledge of use of DME, difficulty walking, decreased strength, dizziness, impaired perceived functional ability, and pain.   ACTIVITY LIMITATIONS:  carrying, lifting, bending, squatting, stairs, transfers, and locomotion level  PARTICIPATION LIMITATIONS: cleaning, interpersonal relationship, shopping, community activity, occupation, and exercise class participation  PERSONAL FACTORS: Age, Time since onset of injury/illness/exacerbation, and 3+ comorbidities: PMH  are also affecting patient's functional outcome.   REHAB POTENTIAL: Good  CLINICAL DECISION MAKING: Evolving/moderate complexity  EVALUATION COMPLEXITY: Moderate  PLAN:  PT FREQUENCY: 1-2x/week  PT DURATION: 6 weeks  PLANNED INTERVENTIONS: 97110-Therapeutic exercises, 97530- Therapeutic activity, [id number]- Neuromuscular re-education, 97535- Self Care, 13244- Manual therapy, [phone]- Gait training, [phone]- Canalith repositioning, [phone]- Aquatic Therapy, Patient/Family education, Balance training, Stair training, Taping, Dry Needling, Joint mobilization, Spinal mobilization, Vestibular training, and DME instructions  PLAN FOR NEXT SESSION: Check orthostatic BP measures.   Dual tasking, EC balance   9:01 AM, 04/18/23 M. Kelly Hunner Garcon, PT, DPT Physical Therapist- [place] Office Number: [phone]

## 2023-04-22 ENCOUNTER — Ambulatory Visit: Admitting: Physical Therapy

## 2023-04-22 ENCOUNTER — Encounter: Payer: Self-pay | Admitting: Physical Therapy

## 2023-04-22 DIAGNOSIS — R2681 Unsteadiness on feet: Secondary | ICD-10-CM | POA: Diagnosis not present

## 2023-04-22 DIAGNOSIS — R2689 Other abnormalities of gait and mobility: Secondary | ICD-10-CM

## 2023-04-22 DIAGNOSIS — M6281 Muscle weakness (generalized): Secondary | ICD-10-CM | POA: Diagnosis not present

## 2023-04-22 DIAGNOSIS — R262 Difficulty in walking, not elsewhere classified: Secondary | ICD-10-CM | POA: Diagnosis not present

## 2023-04-22 NOTE — Therapy (Signed)
 OUTPATIENT PHYSICAL THERAPY NEURO TREATMENT NOTE   Patient Name: Wesley [name] MRN: [id number] DOB:11/06/1945, 78 y.o., male Today's Date: 04/22/2023   PCP: Ransom Byers, MD REFERRING PROVIDER: Ransom Byers, MD  END OF SESSION:  PT End of Session - 04/22/23 0758     Visit Number 7    Number of Visits 10    Date for PT Re-Evaluation 05/13/23    Authorization Type [employer]    Authorization Time Period approved 10 PT visits from 03/25/2023-05/13/2023    Authorization - Visit Number 7    Authorization - Number of Visits 10    Progress Note Due on Visit 10    PT Start Time 0803    PT Stop Time 0844    PT Time Calculation (min) 41 min    Equipment Utilized During Treatment Gait belt    Activity Tolerance Patient tolerated treatment well    Behavior During Therapy WFL for tasks assessed/performed                 Past Medical History:  Diagnosis Date   Depression    Diverticulosis    Hyperlipidemia    Hypertension    Migraine    OSA on CPAP    mild obstructive sleep apnea with an AHI of 8 and O2 saturations as low as 85%.  On CPAP 10cm H2O   Past Surgical History:  Procedure Laterality Date   ATRIAL FIBRILLATION ABLATION N/A 10/17/2022   Procedure: ATRIAL FIBRILLATION ABLATION;  Surgeon: Ardeen Kohler, MD;  Location: [facility] INVASIVE CV LAB;  Service: Cardiovascular;  Laterality: N/A;   CARDIOVERSION N/A 05/24/2021   Procedure: CARDIOVERSION;  Surgeon: Wendie Hamburg, MD;  Location: [facility] ENDOSCOPY;  Service: Cardiovascular;  Laterality: N/A;   CARDIOVERSION N/A 07/31/2022   Procedure: CARDIOVERSION;  Surgeon: Jerryl Morin, DO;  Location: [facility] INVASIVE CV LAB;  Service: Cardiovascular;  Laterality: N/A;   CYSTOSCOPY WITH INSERTION OF UROLIFT N/A 04/28/2019   Procedure: CYSTOSCOPY WITH INSERTION OF UROLIFT;  Surgeon: Christina Coyer, MD;  Location: [facility];  Service: Urology;  Laterality: N/A;   GANGLION CYST EXCISION      PROSTATE BIOPSY N/A 04/28/2019   Procedure: BIOPSY TRANSRECTAL ULTRASONIC PROSTATE (TUBP);  Surgeon: Christina Coyer, MD;  Location: [facility];  Service: Urology;  Laterality: N/A;   Patient Active Problem List   Diagnosis Date Noted   Hypercoagulable state due to persistent atrial fibrillation (HCC) 08/23/2022   OSA on CPAP 01/03/2022   Persistent atrial fibrillation (HCC)    Diverticulitis of colon with perforation 12/18/2020   Bruxism 05/07/2017   Laryngopharyngeal reflux (LPR) 05/07/2017   Presbycusis of both ears 05/07/2017   Hemorrhoids, external, thrombosed 02/13/2012    ONSET DATE: worse over past 6 months  REFERRING DIAG:  R29.818 (ICD-10-CM) - Other symptoms and signs involving the nervous system    THERAPY DIAG:  Unsteadiness on feet  Other abnormalities of gait and mobility  Rationale for Evaluation and Treatment: Rehabilitation  SUBJECTIVE:  SUBJECTIVE STATEMENT: It's been a busy week with Easter and family visiting.  Think things are improving slightly and sometimes still have the imbalance and dizziness.  Have not fallen recently.  Pt accompanied by: self  PERTINENT HISTORY: he has had episodes of feeling unsteady while walking. he was trying to keep up at the theater while walking and almost fell (his son caught him). sometimes feels lightheaded. he feels not strong overall.  +shuffling gait for years his wife says.  Reports he received cortizone shots to bilat knees.   PAIN:  Are you having pain? No  PRECAUTIONS: Fall  RED FLAGS: None   WEIGHT BEARING RESTRICTIONS: No  FALLS: Has patient fallen in last 6 months? Yes. Number of falls falls 1x/wk  LIVING ENVIRONMENT: Lives with: lives with their spouse Lives in: House/apartment Stairs: Yes Has  following equipment at home: None  PLOF: Independent  PATIENT GOALS: improve symptoms/balance  OBJECTIVE:    TODAY'S TREATMENT: 04/22/2023 Activity Comments  Vitals:  145/91, HR 58 bpm   Sit to stand: 5 reps from mat surface 5 reps from mat surface EC 5 reps, standing on Airex supervision  Forward step ups x 5 reps Forward step ups with forward taps to 12" step, x 10 reps Decreased foot clearance LLE with step taps  Standing on Airex: Alt single and double step taps to cones, x 10 each Alt forward step taps to floor x 10 Alt side step taps to floor x 10 Forward/back step taps x 10 HR 75, O2 sats 95%  Intermittent UE support at counter; cues for increased L foot clearance with conversation/recall dual tasking  Forward/back walking Tandem gait Monster walk Min guard, cues for looking ahead at visual target-improved balance               HOME EXERCISE PROGRAM Last updated: 04/16/23 Access Code: [email or web address] URL: [email or web address] Date: 04/16/2023 Prepared by: [facility] - Outpatient  Rehab - [facility]  Exercises - Corner Balance Feet Together With Eyes Closed  - 1 x daily - 7 x weekly - 3 sets - 30 sec hold - Corner Balance Feet Together: Eyes Open With Head Turns  - 1 x daily - 7 x weekly - 3 sets - 3 reps - Corner Balance Feet Together: Eyes Closed With Head Turns  - 1 x daily - 7 x weekly - 3 sets - 3 reps - Semi-Tandem Balance at [employer] Eyes Open  - 1 x daily - 7 x weekly - 3 sets - 10 reps - Sit to Stand with Eyes Closed  - 1 x daily - 7 x weekly - 3 sets - 10 reps - Forward Step Up with Counter Support  - 1 x daily - 5 x weekly - 2 sets - 10 reps   PATIENT EDUCATION: Education details: Continue current HEP Person educated: Patient Education method: Explanation, Demonstration, Tactile cues, Verbal cues, and Handouts Education comprehension: verbalized understanding and returned demonstration     Below measures were taken at time  of initial evaluation unless otherwise specified:    VESTIBULAR ASSESSMENT   GENERAL OBSERVATION: Slowed, guarded mobility    SYMPTOM BEHAVIOR:   Subjective history: See PMH above.  Falls about 1x/wk, reports dizziness/imbalance going on at least 1 year, worsening since PT discharge in September 2024.  Has hx of A-fib, hx of migraines; no hx of positional vertigo.   Non-Vestibular symptoms: tinnitus   Type of dizziness: Imbalance (Disequilibrium), Unsteady with head/body turns, and Lightheadedness/Faint   Frequency:  constant   Duration: constant   Aggravating factors: Induced by position change: sit to stand and Induced by motion: looking up at the ceiling, turning body quickly, turning head quickly, and closing eyes   Relieving factors: head stationary and slow movements   Progression of symptoms: worse   OCULOMOTOR EXAM:  Wears progressive lenses   Ocular Alignment:  R eye  slightly elevated and adducts with motion   Ocular ROM:  R eye adducts faster with L horizontal gaze   Spontaneous Nystagmus: absent   Gaze-Induced Nystagmus: absent   Smooth Pursuits:  mild saccadic movements with vertical motion   Saccades: intact   Convergence/Divergence: 9 cm    VESTIBULAR - OCULAR REFLEX:    Slow VOR: Comment: difficult to coordinate horizontal   VOR Cancellation: Normal   Head-Impulse Test: HIT Right: negative HIT Left: negative   Dynamic Visual Acuity: Static: Line 8 Dynamic: Line 5    POSITIONAL TESTING:  Right Roll Test: Negative; Duration: NA Left Roll Test: Negative; Duration: NA Right Dix-Hallpike:  Negative, initial dizziness upon sitting up and resolves within sec ; Duration:NA Left Dix-Hallpike: Negative; Duration: NA     PATIENT EDUCATION: Education details: rationale for vestibular assessment/findings, ways to progress exercises to treat; ultimate plan to treat functional deficits in regards to balance, dizziness Person educated: Patient Education method:  Explanation, Demonstration, and Verbal cues Education comprehension: verbalized understanding  ----------------------------------------------------------- Note: Objective measures were completed at Evaluation unless otherwise noted.  Vitals: 148/86 mmHg, 76 bpm  DIAGNOSTIC FINDINGS:   COGNITION: Overall cognitive status: Within functional limits for tasks assessed, reports some increased difficulty with memory and word finding   SENSATION: Not tested, reports able to  COORDINATION: WNL  EDEMA:  none  MUSCLE TONE:  NT  MUSCLE LENGTH: WFL  DTRs: NT   POSTURE: forward head  LOWER EXTREMITY ROM:     Active  Right Eval Left Eval  Hip flexion WNL WNL  Hip extension    Hip abduction WNL WNL  Hip adduction    Hip internal rotation    Hip external rotation    Knee flexion WNL WNL  Knee extension WNL WNL  Ankle dorsiflexion WNL WNL  Ankle plantarflexion WNL WNL  Ankle inversion    Ankle eversion     (Blank rows = not tested)  LOWER EXTREMITY MMT:  resisted tests in sitting  MMT Right Eval Left Eval  Hip flexion 5 5  Hip extension    Hip abduction 4 4  Hip adduction 5 5  Hip internal rotation    Hip external rotation    Knee flexion 5 5  Knee extension 5 5  Ankle dorsiflexion 5 5  Ankle plantarflexion    Ankle inversion    Ankle eversion    (Blank rows = not tested)  --very pronounced knee crepitus during manual muscle testing  BED MOBILITY:  indep  TRANSFERS: Assistive device utilized: None  Sit to stand: Complete Independence Stand to sit: Complete Independence Chair to chair: Complete Independence Floor: NT    CURB:  Level of Assistance: Complete Independence Assistive device utilized: None Curb Comments:   STAIRS: Level of Assistance: Complete Independence and Modified independence Stair Negotiation Technique: Alternating Pattern  with No Rails Single Rail on Right Number of Stairs:   Height of Stairs:   Comments:   GAIT: Gait  pattern: WFL Distance walked:  Assistive device utilized: None Level of assistance: Complete Independence, decreased speed Comments: instances of shuffling/right toe catch  FUNCTIONAL TESTS:  5  times sit to stand: 17.41 sec Functional gait assessment: 18/30 Gait speed: 1.9 ft sec  M-CTSIB  Condition 1: Firm Surface, EO 30 Sec, Normal Sway  Condition 2: Firm Surface, EC 30 Sec, Moderate Sway  Condition 3: Foam Surface, EO 30 Sec, Normal and Mild Sway  Condition 4: Foam Surface, EC 30 Sec, Moderate and Severe Sway                                                                                                                                    TREATMENT DATE: 03/25/23    PATIENT EDUCATION: Education details: assessment details, rationale for further assessment, demonstrated method for assessing orthostatic hypotension,  HEP initiation Person educated: Patient Education method: Explanation and Handouts Education comprehension: needs further education  HOME EXERCISE PROGRAM: Access Code: [email or web address] URL: [email or web address] Date: 03/25/2023 Prepared by: Tedd Favorite  Exercises - Corner Balance Feet Together With Eyes Closed  - 1 x daily - 7 x weekly - 3 sets - 30 sec hold - Corner Balance Feet Together: Eyes Open With Head Turns  - 1 x daily - 7 x weekly - 3 sets - 3 reps - Corner Balance Feet Together: Eyes Closed With Head Turns  - 1 x daily - 7 x weekly - 3 sets - 3 reps - Semi-Tandem Balance at [employer] Eyes Open  - 1 x daily - 7 x weekly - 3 sets - 10 reps - Sit to Stand with Eyes Closed  - 1 x daily - 7 x weekly - 3 sets - 10 reps  GOALS: Goals reviewed with patient? Yes  SHORT TERM GOALS: Target date: 04/15/2023    Patient will be independent in HEP to improve functional outcomes Baseline: Goal status: MET  2.  Demo improved BLE strength and reduced risk for falls per time < 15 sec 5xSTS test Baseline: 17 sec; (04/18/23) 13.75 sec Goal status:  MET  3.  Demo improved postural stability/awareness per mild sway x 30 sec condition 4 M-CTSIB to improve safety with ADL Baseline: mod-severe x 30 sec; (04/18/23) mild sway x 30 sec Goal status: MET    LONG TERM GOALS: Target date: 05/13/23   Demonstrate improved balance and reduce risk for falls per score 25/30 Functional Gait Assessment Baseline: 18/30 Goal status: IN PROGRESS  2.  Independent in advanced HEP for improved carryover and max benefit at D/C (gym-based strength, balance class, aquatic exercise, etc) Baseline:  Goal status: IN PROGRESS  3.  Demo ability to maintain ambulation speed 2.8 ft/sec to improve efficiency of community mobility Baseline: 1.9 ft/sec Goal status: IN PROGRESS    ASSESSMENT:  CLINICAL IMPRESSION: Pt presents today with no new complaints and no new falls. Skilled PT session focused on balance activities and gait activities. Dual tasking today was in the setting of conversation tasks, recalling Easter meal, grandchildren ages, etc; and pt needs extra time with conversational/recall tasks.  He does have  word finding difficulty at times.  Pt will continue to benefit from skilled PT towards goals for improved functional mobility and decreased fall risk.   OBJECTIVE IMPAIRMENTS: Abnormal gait, decreased activity tolerance, decreased balance, decreased knowledge of use of DME, difficulty walking, decreased strength, dizziness, impaired perceived functional ability, and pain.   ACTIVITY LIMITATIONS: carrying, lifting, bending, squatting, stairs, transfers, and locomotion level  PARTICIPATION LIMITATIONS: cleaning, interpersonal relationship, shopping, community activity, occupation, and exercise class participation  PERSONAL FACTORS: Age, Time since onset of injury/illness/exacerbation, and 3+ comorbidities: PMH  are also affecting patient's functional outcome.   REHAB POTENTIAL: Good  CLINICAL DECISION MAKING: Evolving/moderate  complexity  EVALUATION COMPLEXITY: Moderate  PLAN:  PT FREQUENCY: 1-2x/week  PT DURATION: 6 weeks  PLANNED INTERVENTIONS: 97110-Therapeutic exercises, 97530- Therapeutic activity, 97112- Neuromuscular re-education, 97535- Self Care, 65784- Manual therapy, [phone]- Gait training, [phone]- Canalith repositioning, [phone]- Aquatic Therapy, Patient/Family education, Balance training, Stair training, Taping, Dry Needling, Joint mobilization, Spinal mobilization, Vestibular training, and DME instructions  PLAN FOR NEXT SESSION:  Dual tasking, EC balance; continue towards LTGs (currently, POC goes through 5/12 and pt is scheduled beyond this date*)   Dessie Flow, PT 04/22/23 8:47 AM Phone: [phone] Fax: [phone]  [facility] at [facility] [address], [address] [place], [place] [address] Phone # [phone] Fax # [phone]

## 2023-04-23 DIAGNOSIS — I700 Atherosclerosis of aorta: Secondary | ICD-10-CM | POA: Diagnosis not present

## 2023-04-23 DIAGNOSIS — Z0000 Encounter for general adult medical examination without abnormal findings: Secondary | ICD-10-CM | POA: Diagnosis not present

## 2023-04-23 DIAGNOSIS — F330 Major depressive disorder, recurrent, mild: Secondary | ICD-10-CM | POA: Diagnosis not present

## 2023-04-23 DIAGNOSIS — R7303 Prediabetes: Secondary | ICD-10-CM | POA: Diagnosis not present

## 2023-04-23 DIAGNOSIS — N400 Enlarged prostate without lower urinary tract symptoms: Secondary | ICD-10-CM | POA: Diagnosis not present

## 2023-04-23 DIAGNOSIS — I4891 Unspecified atrial fibrillation: Secondary | ICD-10-CM | POA: Diagnosis not present

## 2023-04-23 DIAGNOSIS — I10 Essential (primary) hypertension: Secondary | ICD-10-CM | POA: Diagnosis not present

## 2023-04-23 DIAGNOSIS — E7800 Pure hypercholesterolemia, unspecified: Secondary | ICD-10-CM | POA: Diagnosis not present

## 2023-04-23 DIAGNOSIS — B009 Herpesviral infection, unspecified: Secondary | ICD-10-CM | POA: Diagnosis not present

## 2023-04-24 DIAGNOSIS — M170 Bilateral primary osteoarthritis of knee: Secondary | ICD-10-CM | POA: Diagnosis not present

## 2023-04-29 ENCOUNTER — Ambulatory Visit

## 2023-05-06 ENCOUNTER — Encounter: Payer: Self-pay | Admitting: Cardiology

## 2023-05-06 ENCOUNTER — Ambulatory Visit: Attending: Family Medicine

## 2023-05-21 DIAGNOSIS — M79645 Pain in left finger(s): Secondary | ICD-10-CM | POA: Diagnosis not present

## 2023-05-21 DIAGNOSIS — Z23 Encounter for immunization: Secondary | ICD-10-CM | POA: Diagnosis not present

## 2023-05-21 DIAGNOSIS — M79674 Pain in right toe(s): Secondary | ICD-10-CM | POA: Diagnosis not present

## 2023-05-24 DIAGNOSIS — M79645 Pain in left finger(s): Secondary | ICD-10-CM | POA: Diagnosis not present

## 2023-05-31 ENCOUNTER — Telehealth: Payer: Self-pay

## 2023-05-31 DIAGNOSIS — M79645 Pain in left finger(s): Secondary | ICD-10-CM | POA: Diagnosis not present

## 2023-05-31 NOTE — Telephone Encounter (Signed)
   Pre-operative Risk Assessment    Patient Name: Wesley [name]  DOB: 01/22/1945 MRN: [id number]   Date of last office visit: 01/11/23 Ardeen Kohler, MD Date of next office visit: 06/07/23 Gaylyn Keas, MD   Request for Surgical Clearance    Procedure:  LEFT THUMB ULNAR COLLATERAL LIGAMENT REPAIR  Date of Surgery:  Clearance 06/10/23                                Surgeon:  DR Arvil Birks Surgeon's Group or Practice Name:  [facility] Phone number:  [phone] Fax number:  [phone]  ATTN: MEGAN DAVIS   Type of Clearance Requested:   - Medical  - Pharmacy:  Hold Apixaban  (Eliquis )     Type of Anesthesia:  BLOCK AND IV SEDATION   Additional requests/questions:    Signed, Collin Deal   05/31/2023, 1:52 PM

## 2023-05-31 NOTE — Telephone Encounter (Signed)
 Pharmacy please advise on holding Eliquis  prior to 06/10/2023 scheduled for 06/10/2023. Thank you.

## 2023-06-04 ENCOUNTER — Telehealth: Payer: Self-pay

## 2023-06-04 NOTE — Telephone Encounter (Signed)
 Pt scheduled tele preop appt add on okay per Katlyn West, NP on 06/05. Med rec and consent done.     Patient Consent for Virtual Visit        Wesley [name] has provided verbal consent on 06/04/2023 for a virtual visit (video or telephone).   CONSENT FOR VIRTUAL VISIT FOR:  Wesley [name]  By participating in this virtual visit I agree to the following:  I hereby voluntarily request, consent and authorize [facility] and its employed or contracted physicians, physician assistants, nurse practitioners or other licensed health care professionals (the Practitioner), to provide me with telemedicine health care services (the "Services") as deemed necessary by the treating Practitioner. I acknowledge and consent to receive the Services by the Practitioner via telemedicine. I understand that the telemedicine visit will involve communicating with the Practitioner through live audiovisual communication technology and the disclosure of certain medical information by electronic transmission. I acknowledge that I have been given the opportunity to request an in-person assessment or other available alternative prior to the telemedicine visit and am voluntarily participating in the telemedicine visit.  I understand that I have the right to withhold or withdraw my consent to the use of telemedicine in the course of my care at any time, without affecting my right to future care or treatment, and that the Practitioner or I may terminate the telemedicine visit at any time. I understand that I have the right to inspect all information obtained and/or recorded in the course of the telemedicine visit and may receive copies of available information for a reasonable fee.  I understand that some of the potential risks of receiving the Services via telemedicine include:  Delay or interruption in medical evaluation due to technological equipment failure or disruption; Information transmitted may not be  sufficient (e.g. poor resolution of images) to allow for appropriate medical decision making by the Practitioner; and/or  In rare instances, security protocols could fail, causing a breach of personal health information.  Furthermore, I acknowledge that it is my responsibility to provide information about my medical history, conditions and care that is complete and accurate to the best of my ability. I acknowledge that Practitioner's advice, recommendations, and/or decision may be based on factors not within their control, such as incomplete or inaccurate data provided by me or distortions of diagnostic images or specimens that may result from electronic transmissions. I understand that the practice of medicine is not an exact science and that Practitioner makes no warranties or guarantees regarding treatment outcomes. I acknowledge that a copy of this consent can be made available to me via my patient portal [facility] MyChart), or I can request a printed copy by calling the office of [facility].    I understand that my insurance will be billed for this visit.   I have read or had this consent read to me. I understand the contents of this consent, which adequately explains the benefits and risks of the Services being provided via telemedicine.  I have been provided ample opportunity to ask questions regarding this consent and the Services and have had my questions answered to my satisfaction. I give my informed consent for the services to be provided through the use of telemedicine in my medical care

## 2023-06-04 NOTE — Telephone Encounter (Signed)
 Pt scheduled tele preop appt add on okay per Katlyn West, NP on 06/05. Med rec and consent done.

## 2023-06-04 NOTE — Telephone Encounter (Signed)
 Called and left VM for patient to schedule tele visit for pre-op clearance.

## 2023-06-04 NOTE — Addendum Note (Signed)
 Addended by: Rosa College D on: 06/04/2023 05:24 PM   Modules accepted: Orders

## 2023-06-04 NOTE — Telephone Encounter (Signed)
 Pt returned call for [place] pre op appt    Best number [phone]

## 2023-06-04 NOTE — Telephone Encounter (Signed)
   Name: Wesley [name]  DOB: 1945/08/04  MRN: [id number]  Primary Cardiologist: Arun K Thukkani, MD  Preoperative team, please contact this patient and set up a phone call appointment for further preoperative risk assessment. Please obtain consent and complete medication review. Thank you for your help.  I confirm that guidance regarding antiplatelet and oral anticoagulation therapy has been completed and, if necessary, noted below.  Per office protocol, patient can hold Eliquis  for 2 days prior to procedure.    I also confirmed the patient resides in the state of [place] . As per [facility] Medical Board telemedicine laws, the patient must reside in the state in which the provider is licensed.  Trenton Verne D Cyprian Gongaware, NP 06/04/2023, 4:39 PM [facility]

## 2023-06-04 NOTE — Telephone Encounter (Signed)
 Patient with diagnosis of afib on Eliquis  for anticoagulation.    Procedure: LEFT THUMB ULNAR COLLATERAL LIGAMENT REPAIR  Date of procedure: 06/10/23   CHA2DS2-VASc Score = 4   This indicates a 4.8% annual risk of stroke. The patient's score is based upon: CHF History: 0 HTN History: 1 Diabetes History: 0 Stroke History: 0 Vascular Disease History: 1 Age Score: 2 Gender Score: 0      CrCl 52 ml/min Platelet count 269  Patient has not had an Afib/aflutter ablation within the last 3 months or DCCV within the last 30 days  Per office protocol, patient can hold Eliquis  for 2 days prior to procedure.    **This guidance is not considered finalized until pre-operative APP has relayed final recommendations.**

## 2023-06-05 DIAGNOSIS — R296 Repeated falls: Secondary | ICD-10-CM | POA: Diagnosis not present

## 2023-06-06 ENCOUNTER — Ambulatory Visit: Attending: Cardiology | Admitting: Emergency Medicine

## 2023-06-06 DIAGNOSIS — Z01810 Encounter for preprocedural cardiovascular examination: Secondary | ICD-10-CM | POA: Diagnosis not present

## 2023-06-06 NOTE — Progress Notes (Signed)
 Virtual Visit via Telephone Note   Because of Wesley [name] co-morbid illnesses, he is at least at moderate risk for complications without adequate follow up.  This format is felt to be most appropriate for this patient at this time.  Due to technical limitations with video connection (technology), today's appointment will be conducted as an audio only telehealth visit, and Wesley [name] verbally agreed to proceed in this manner.   All issues noted in this document were discussed and addressed.  No physical exam could be performed with this format.  Evaluation Performed:  Preoperative cardiovascular risk assessment _____________   Date:  06/06/2023   Patient ID:  Wesley [name], Wesley [name] 06/12/1945, MRN [id number] Patient Location:  Home Provider location:   Office  Primary Care Provider:  Ransom Byers, MD Primary Cardiologist:  Arun K Thukkani, MD  Chief Complaint / Patient Profile   78 y.o. y/o male with a h/o hypertension, hyperlipidemia, aortic atherosclerosis by CT imaging, obstructive sleep apnea, prostate cancer, atrial fibrillation who is pending left thumb ulnar collateral ligament repair with [facility] on 06/10/2023 and presents today for telephonic preoperative cardiovascular risk assessment.  History of Present Illness    Wesley [name] is a 78 y.o. male who presents via audio/video conferencing for a telehealth visit today.  Pt was last seen in cardiology clinic on 01/11/2023 by Dr. Daneil Dunker.  At that time Wesley [name] was doing well.  The patient is now pending procedure as outlined above. Since his last visit, he denies chest pain, shortness of breath, lower extremity edema, fatigue, palpitations, melena, hematuria, hemoptysis, diaphoresis, weakness, presyncope, syncope, orthopnea, and PND.  Today patient is doing well overall.  He is without any acute cardiovascular concerns or complaints.  He is currently being worked up with his primary care provider  for ongoing dizziness and a fall.  He does have a brain MRI scheduled.  Overall from a cardiac perspective patient is doing well.  He denies any anginal symptoms.  He is without any exertional chest pain.  He does stay fairly active with slight limitation due to his dizziness.  He is able to walk, walk up stairs, do housework, do yard work without any limitation.  He monitors his heart rhythm on his Apple Watch and denies any reoccurrence of atrial fibrillation.  Overall he is able to complete greater than 4 METS.  Past Medical History    Past Medical History:  Diagnosis Date   Depression    Diverticulosis    Hyperlipidemia    Hypertension    Migraine    OSA on CPAP    mild obstructive sleep apnea with an AHI of 8 and O2 saturations as low as 85%.  On CPAP 10cm H2O   Past Surgical History:  Procedure Laterality Date   ATRIAL FIBRILLATION ABLATION N/A 10/17/2022   Procedure: ATRIAL FIBRILLATION ABLATION;  Surgeon: Ardeen Kohler, MD;  Location: [facility] INVASIVE CV LAB;  Service: Cardiovascular;  Laterality: N/A;   CARDIOVERSION N/A 05/24/2021   Procedure: CARDIOVERSION;  Surgeon: Wendie Hamburg, MD;  Location: [facility] ENDOSCOPY;  Service: Cardiovascular;  Laterality: N/A;   CARDIOVERSION N/A 07/31/2022   Procedure: CARDIOVERSION;  Surgeon: Jerryl Morin, DO;  Location: [facility] INVASIVE CV LAB;  Service: Cardiovascular;  Laterality: N/A;   CYSTOSCOPY WITH INSERTION OF UROLIFT N/A 04/28/2019   Procedure: CYSTOSCOPY WITH INSERTION OF UROLIFT;  Surgeon: Christina Coyer, MD;  Location: [facility];  Service: Urology;  Laterality: N/A;   GANGLION CYST EXCISION  PROSTATE BIOPSY N/A 04/28/2019   Procedure: BIOPSY TRANSRECTAL ULTRASONIC PROSTATE (TUBP);  Surgeon: Christina Coyer, MD;  Location: [facility];  Service: Urology;  Laterality: N/A;    Allergies  Allergies  Allergen Reactions   Ivp Dye [Iodinated Contrast Media] Hives and Swelling   Ambien  [Zolpidem ]      Hallucinations    Iohexol       Code: HIVES, Desc: PT DEVELOPED HIVES DURING IV ADMINISTRATION/    Percocet [Oxycodone-Acetaminophen ] Itching   Zetia [Ezetimibe] Nausea And Vomiting   Penicillins Hives and Rash    Rash/hives at age of 72    Home Medications    Prior to Admission medications   Medication Sig Start Date End Date Taking? Authorizing Provider  amLODipine  (NORVASC ) 10 MG tablet Take 1 tablet by mouth daily. 03/04/23   [provider]  atorvastatin (LIPITOR) 10 MG tablet Take 10 mg by mouth daily.    [provider]  buPROPion  (WELLBUTRIN  XL) 150 MG 24 hr tablet Take 150 mg by mouth daily.    [provider]  ELIQUIS  5 MG TABS tablet Take 1 tablet (5 mg total) by mouth 2 (two) times daily. 03/26/23   Thukkani, Arun K, MD  hydrochlorothiazide  (HYDRODIURIL ) 25 MG tablet Take 1 tablet (25 mg total) by mouth daily. 11/15/22 06/04/23  Nathanel Bal, PA-C  tamsulosin  (FLOMAX ) 0.4 MG CAPS capsule Take 0.4 mg by mouth daily after breakfast.    [provider]    Physical Exam    Vital Signs:  Wesley [name] does not have vital signs available for review today.  Given telephonic nature of communication, physical exam is limited. AAOx3. NAD. Normal affect.  Speech and respirations are unlabored.  Accessory Clinical Findings    None  Assessment & Plan    1.  Preoperative Cardiovascular Risk Assessment: According to the Revised Cardiac Risk Index (RCRI), his Perioperative Risk of Major Cardiac Event is (%): 0.4. His Functional Capacity in METs is: 5.62 according to the Duke Activity Status Index (DASI). Therefore, based on ACC/AHA guidelines, patient would be at acceptable risk for the planned procedure without further cardiovascular testing.  The patient was advised that if he develops new symptoms prior to surgery to contact our office to arrange for a follow-up visit, and he verbalized understanding.  Per office protocol, patient can hold  Eliquis  for 2 days prior to procedure.    A copy of this note will be routed to requesting surgeon.  Time:   Today, I have spent 8 minutes with the patient with telehealth technology discussing medical history, symptoms, and management plan.     Ava Boatman, NP  06/06/2023, 10:51 AM

## 2023-06-07 ENCOUNTER — Ambulatory Visit: Admitting: Cardiology

## 2023-06-10 DIAGNOSIS — G8918 Other acute postprocedural pain: Secondary | ICD-10-CM | POA: Diagnosis not present

## 2023-06-10 DIAGNOSIS — S63418A Traumatic rupture of collateral ligament of other finger at metacarpophalangeal and interphalangeal joint, initial encounter: Secondary | ICD-10-CM | POA: Diagnosis not present

## 2023-06-10 DIAGNOSIS — S63642A Sprain of metacarpophalangeal joint of left thumb, initial encounter: Secondary | ICD-10-CM | POA: Diagnosis not present

## 2023-06-13 ENCOUNTER — Other Ambulatory Visit: Payer: Self-pay | Admitting: Family Medicine

## 2023-06-13 DIAGNOSIS — R296 Repeated falls: Secondary | ICD-10-CM

## 2023-06-13 DIAGNOSIS — R413 Other amnesia: Secondary | ICD-10-CM

## 2023-06-18 DIAGNOSIS — R350 Frequency of micturition: Secondary | ICD-10-CM | POA: Diagnosis not present

## 2023-06-18 DIAGNOSIS — R351 Nocturia: Secondary | ICD-10-CM | POA: Diagnosis not present

## 2023-06-18 DIAGNOSIS — R3912 Poor urinary stream: Secondary | ICD-10-CM | POA: Diagnosis not present

## 2023-06-18 DIAGNOSIS — R3915 Urgency of urination: Secondary | ICD-10-CM | POA: Diagnosis not present

## 2023-06-18 DIAGNOSIS — N401 Enlarged prostate with lower urinary tract symptoms: Secondary | ICD-10-CM | POA: Diagnosis not present

## 2023-06-21 DIAGNOSIS — M79645 Pain in left finger(s): Secondary | ICD-10-CM | POA: Diagnosis not present

## 2023-06-24 ENCOUNTER — Encounter: Payer: Self-pay | Admitting: *Deleted

## 2023-06-25 ENCOUNTER — Ambulatory Visit: Admitting: Neurology

## 2023-06-25 ENCOUNTER — Encounter: Payer: Self-pay | Admitting: Neurology

## 2023-06-25 VITALS — BP 126/67 | HR 83

## 2023-06-25 DIAGNOSIS — R419 Unspecified symptoms and signs involving cognitive functions and awareness: Secondary | ICD-10-CM | POA: Diagnosis not present

## 2023-06-25 DIAGNOSIS — R2689 Other abnormalities of gait and mobility: Secondary | ICD-10-CM | POA: Diagnosis not present

## 2023-06-25 DIAGNOSIS — Z9181 History of falling: Secondary | ICD-10-CM | POA: Diagnosis not present

## 2023-06-25 DIAGNOSIS — G4733 Obstructive sleep apnea (adult) (pediatric): Secondary | ICD-10-CM | POA: Diagnosis not present

## 2023-06-25 DIAGNOSIS — Z789 Other specified health status: Secondary | ICD-10-CM | POA: Diagnosis not present

## 2023-06-25 NOTE — Patient Instructions (Addendum)
 It was nice to meet you today.  You have complaints of memory loss: memory loss or changes in cognitive function can have many reasons and does not always mean you have dementia.  There are several conditions and situations that can contribute to subjective or objective memory loss.  These factors include: depression, stress, sleep deprivation or poor sleep from insomnia or sleep apnea, dehydration, fluctuation in blood sugar values, thyroid  or electrolyte dysfunction, medication effects from sedating medications or narcotic pain medication for example and certain vitamin deficiencies such as vitamin B12 deficiency, and anemia. Dementia can be caused by stroke, brain atherosclerosis or brain vascular disease due to vascular risk factors (smoking, high blood pressure, high cholesterol, obesity and uncontrolled diabetes), certain degenerative brain disorders (including Parkinson's disease and Multiple sclerosis) and by Alzheimer's disease or other, more rare and sometimes hereditary causes.   Here is what I would recommend:   I would be happy to review your brain MRI results once they become available.  You can call our office or send us  a MyChart message after you have had the scan and once the results are available.   I would like for you to start using a walker for gait safety as you have fallen and injure yourself.  You can use any walker of your choice, you can get input from physical therapy or your primary care regarding how to get a walker and which 1 to get.   I recommend you reduce your caffeine intake and limit yourself to 1 or 2 servings per day, up to 16 ounces.  Caffeine can cause insomnia and also trigger your atrial fibrillation.   Increase your water  intake to 6 to 8 cups of water  per day, 8 ounce size each.   Please talk to your primary care about your antidepressant medication and side effects as you stop taking the fluoxetine and also discussed with her that you have not been able to find a  counselor yet.  She may be able to refer you to another psychologist/therapist. Eliminate drinking alcohol daily as it can affect your balance adversely and does not combine well with Eliquis .   Follow-up with your cardiologist regarding your sleep apnea and treatment.  Treating sleep apnea may help your memory, your depression, your balance, and may prevent further episodes of atrial fibrillation.   For now, we can plan to follow-up in this clinic as needed.

## 2023-06-25 NOTE — Progress Notes (Signed)
 Subjective:    Patient ID: Wesley [name] is a 78 y.o. male.  HPI    True Mar, MD, PhD [facility] [address] [address] [place], [place] [address]  Dear Dr. Chrystal,  I saw your patient, Wesley [name], upon your kind request in my neurologic clinic today for initial consultation of his memory loss.  The patient is unaccompanied today.  As you know, Wesley [name] is a 78 year old male with an underlying medical history of atrial fibrillation, hypertension, hyperlipidemia, BPH, OSA, osteoarthritis, migraine headaches, history of kidney stones, diverticulosis, anxiety, depression, and overweight state, who reports problems with his balance and dizziness since he was diagnosed with A-fib about a year or year and a half ago.  He has fallen.  He recently injured his left and needed a procedure, he currently has his left forearm in a cast.  He reports not using his AutoPap machine.  Of note, he had a sleep study through [facility] on 06/08/2021, which showed mild obstructive sleep apnea with an AHI of 8/h, O2 nadir 85%.  Notably, he did not achieve any N3 or REM sleep.  He is followed by Dr. Shlomo for his sleep apnea and as I understand was encouraged to make a follow-up appointment.  A compliance download for the past year shows 2 days of usage only.  He does not drink any water .  He admits that he only drinks tea, about 32 ounces per day, 1 cup of coffee in the morning and 1 glass of wine with dinner, 8 ounce size.    I reviewed your office note from 03/04/2023.  He reported depressed symptoms at the time.  He was hospitalized for his mood disorder.  He reported feeling unsteady while walking.  His wife felt that he had a shuffling gait.  He was noted to have a normal gait on examination.  He reported memory changes.  His memory changes were attributed to suboptimally controlled depression at the time and he was started on fluoxetine.  He was  advised to continue with Wellbutrin .  He had blood work through your office including thyroid  function tests, CBC, CMP, vitamin B12 level and vitamin D level.  I was able to review test results in his paper chart.  TSH was normal at 0.63, vitamin D level was significantly low at 15.7, CMP showed benign findings, BUN was 20, creatinine borderline at 1.29, CBC without differential was benign, vitamin B12 level was not included in the results.  He is scheduled to have a brain MRI with and without contrast tomorrow. He is followed by cardiology.  He is followed by orthopedics for his knee arthritis.  He has had physical therapy.  Of note, he no longer takes the Prozac.  He was also advised to seek counseling but has not found a therapist yet.  He stopped taking his Prozac about a week after starting it because of problems ejaculating.  He has not been in touch with your office regarding side effects with Prozac he reports.  His Past Medical History Is Significant For: Past Medical History:  Diagnosis Date   Depression    Diverticulosis    Hyperlipidemia    Hypertension    Migraine    OSA on CPAP    mild obstructive sleep apnea with an AHI of 8 and O2 saturations as low as 85%.  On CPAP 10cm H2O    His Past Surgical History Is Significant For: Past Surgical History:  Procedure Laterality Date   ATRIAL FIBRILLATION ABLATION N/A 10/17/2022   Procedure: ATRIAL FIBRILLATION ABLATION;  Surgeon: Kennyth Chew, MD;  Location: [facility] INVASIVE CV LAB;  Service: Cardiovascular;  Laterality: N/A;   CARDIOVERSION N/A 05/24/2021   Procedure: CARDIOVERSION;  Surgeon: Kate Lonni CROME, MD;  Location: [facility] ENDOSCOPY;  Service: Cardiovascular;  Laterality: N/A;   CARDIOVERSION N/A 07/31/2022   Procedure: CARDIOVERSION;  Surgeon: Sheena Pugh, DO;  Location: [facility] INVASIVE CV LAB;  Service: Cardiovascular;  Laterality: N/A;   CYSTOSCOPY WITH INSERTION OF UROLIFT N/A 04/28/2019   Procedure: CYSTOSCOPY WITH INSERTION  OF UROLIFT;  Surgeon: Nieves Cough, MD;  Location: [facility];  Service: Urology;  Laterality: N/A;   GANGLION CYST EXCISION     PROSTATE BIOPSY N/A 04/28/2019   Procedure: BIOPSY TRANSRECTAL ULTRASONIC PROSTATE (TUBP);  Surgeon: Nieves Cough, MD;  Location: [facility];  Service: Urology;  Laterality: N/A;    His Family History Is Significant For: Family History  Problem Relation Age of Onset   Heart disease Mother    Pneumonia Father    Alzheimer's disease Neg Hx    Dementia Neg Hx    Sleep apnea Neg Hx     His Social History Is Significant For: Social History   Socioeconomic History   Marital status: Married    Spouse name: Not on file   Number of children: Not on file   Years of education: Not on file   Highest education level: Not on file  Occupational History   Not on file  Tobacco Use   Smoking status: Never   Smokeless tobacco: Former    Types: Engineer, drilling   Vaping status: Not on file  Substance and Sexual Activity   Alcohol use: Yes    Alcohol/week: 1.0 standard drink of alcohol    Types: 1 Glasses of wine per week    Comment: Daily.   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 Strain: Not on Ship broker Insecurity: Not on file  Transportation Needs: Not on file  Physical Activity: Not on file  Stress: Not on file  Social Connections: Not on file    His Allergies Are:  Allergies  Allergen Reactions   Ivp Dye [Iodinated Contrast Media] Hives and Swelling   Ambien  [Zolpidem ]     Hallucinations    Iohexol       Code: HIVES, Desc: PT DEVELOPED HIVES DURING IV ADMINISTRATION/    Percocet [Oxycodone-Acetaminophen ] Itching   Zetia [Ezetimibe] Nausea And Vomiting   Penicillins Hives and Rash    Rash/hives at age of 73  :   His Current Medications Are:  Outpatient  Encounter Medications as of 06/25/2023  Medication Sig   amLODipine  (NORVASC ) 10 MG tablet Take 1 tablet by mouth daily.   atorvastatin (LIPITOR) 10 MG tablet Take 10 mg by mouth daily.   buPROPion  (WELLBUTRIN  XL) 150 MG 24 hr tablet Take 150 mg by mouth daily.   ELIQUIS  5 MG TABS tablet Take 1 tablet (5 mg total) by mouth 2 (two) times daily.   hydrochlorothiazide  (HYDRODIURIL ) 25 MG tablet Take 1 tablet (25 mg total) by mouth daily.   tamsulosin  (FLOMAX ) 0.4 MG CAPS capsule Take 0.4 mg by mouth daily after breakfast.   No facility-administered encounter medications on file as  of 06/25/2023.  :   Review of Systems:  Out of a complete 14 point review of systems, all are reviewed and negative with the exception of these symptoms as listed below:  Review of Systems  Neurological:        Pt here for multiple falls Pt states gait is off Pt states forgetful    ESS:7 FSS:56      Objective:  Neurological Exam  Physical Exam Physical Examination:   Vitals:   06/25/23 0830  BP: 126/67  Pulse: 83    General Examination: The patient is a very pleasant 78 y.o. male in no acute distress. He appears well-developed and well-nourished and well groomed.   HEENT: Normocephalic, atraumatic, pupils are equal, round and reactive to light, extraocular tracking is good without limitation to gaze excursion or nystagmus noted. Hearing is grossly intact. Face is symmetric with normal facial animation. Speech is clear with no dysarthria noted. There is no hypophonia. There is no lip, neck/head, jaw or voice tremor. Neck is supple with full range of passive and active motion. There are no carotid bruits on auscultation. Oropharynx exam reveals: moderate mouth dryness, with tongue brownish discoloration, adequate dental hygiene, moderate airway crowding.  Tongue protrudes centrally and palate elevates symmetrically.    Chest: Clear to auscultation without wheezing, rhonchi or crackles noted.  Heart:  S1+S2+0, regular and normal without murmurs, rubs or gallops noted.  Slightly irregular, possible extra beats.  Abdomen: Soft, non-tender and non-distended.  Extremities: There is no pitting edema in the distal lower extremities bilaterally.  Left forearm in a cast.  Skin: Warm and dry without trophic changes noted.   Musculoskeletal: exam reveals no obvious joint deformities.   Neurologically:  Mental status: The patient is awake, alert and able to provide his history, he is not very elaborate with his history details.   There is no evidence of aphasia, agnosia, apraxia or anomia. Speech is clear with normal prosody and enunciation. Thought process is linear. Mood is constricted and affect is blunted.  Cranial nerves II - XII are as described above under HEENT exam.  Motor exam: Normal bulk, strength and tone is noted. There is no obvious action or resting tremor.  Fine motor skills and coordination: grossly intact.  Left hand not testable. Cerebellar testing: No dysmetria or intention tremor. There is no truncal or gait ataxia.  Normal heel-to-shin bilaterally. Sensory exam: intact to light touch in the upper and lower extremities.  Reflexes are 1+ in the right upper extremity, 1+ in the knees and absent in the ankles.  Toes are downgoing. Gait, station and balance: He stands easily.  He stands slightly wide-based.  He walks slightly slowly and cautiously, no shuffling, preserved arm swing, no walking aid.  Has a tendency to look down while walking.  Assessment and Plan:   In summary, Wesley [name] is a 78 year old male with an underlying medical history of atrial fibrillation, hypertension, hyperlipidemia, BPH, OSA, osteoarthritis, migraine headaches, history of kidney stones, diverticulosis, anxiety, depression, and overweight state, who presents for evaluation of his balance problem and cognitive concerns.  His history and examination are not in keeping with parkinsonism.  I had a  long discussion with the patient today, particularly with regards to lifestyle modification and the importance of fall prevention.   Below is a summary of my recommendations and are talking points based on today's visit, his chart review and his examination; he was given these instructions verbally during the visit and also in writing  in his after visit summary which he indicated he would access electronically: This was an extended visit of over 60 minutes with copious record review involved, and considerable counseling and coordination of care, as well as addressing multiple problems. << I would be happy to review your brain MRI results once they become available.  You can call our office or send us  a MyChart message after you have had the scan and once the results are available.   I would like for you to start using a walker for gait safety as you have fallen and injure yourself.  You can use any walker of your choice, you can get input from physical therapy or your primary care regarding how to get a walker and which 1 to get.   I recommend you reduce your caffeine intake and limit yourself to 1 or 2 servings per day, up to 16 ounces.  Caffeine can cause insomnia and also trigger your atrial fibrillation.   Increase your water  intake to 6 to 8 cups of water  per day, 8 ounce size each.   Please talk to your primary care about your antidepressant medication and side effects as you stop taking the fluoxetine and also discussed with her that you have not been able to find a counselor yet.  She may be able to refer you to another psychologist/therapist. Eliminate drinking alcohol daily as it can affect your balance adversely and does not combine well with Eliquis .   Follow-up with your cardiologist regarding your sleep apnea and treatment.  Treating sleep apnea may help your memory, your depression, your balance, and may prevent further episodes of atrial fibrillation.   For now, we can plan to follow-up in  this clinic as needed.  >> Thank you very much for allowing me to participate in the care of this nice patient. If I can be of any further assistance to you please do not hesitate to call me at [phone].  Sincerely,   True Mar, MD, PhD

## 2023-06-26 ENCOUNTER — Ambulatory Visit
Admission: RE | Admit: 2023-06-26 | Discharge: 2023-06-26 | Disposition: A | Discharge status: Home / Self Care | Source: Ambulatory Visit | Attending: Family Medicine

## 2023-06-26 DIAGNOSIS — R413 Other amnesia: Secondary | ICD-10-CM

## 2023-06-26 DIAGNOSIS — R296 Repeated falls: Secondary | ICD-10-CM

## 2023-06-26 DIAGNOSIS — I6782 Cerebral ischemia: Secondary | ICD-10-CM | POA: Diagnosis not present

## 2023-06-26 DIAGNOSIS — G319 Degenerative disease of nervous system, unspecified: Secondary | ICD-10-CM | POA: Diagnosis not present

## 2023-06-26 MED ORDER — GADOPICLENOL 0.5 MMOL/ML IV SOLN
10.0000 mL | Freq: Once | INTRAVENOUS | Status: AC | PRN
  Administered 2023-06-26: 10 mL via INTRAVENOUS

## 2023-07-08 ENCOUNTER — Encounter: Payer: Self-pay | Admitting: Cardiology

## 2023-07-09 NOTE — Progress Notes (Unsigned)
 Electrophysiology Office Note:   Date:  07/10/2023  ID:  Wesley [name], Wesley [name] 1945/04/14, MRN [id number]  Primary Cardiologist: Arun K Thukkani, MD Primary Heart Failure: None Electrophysiologist: Fonda Kitty, MD      History of Present Illness:   Wesley [name] is a 78 y.o. male, retired Education officer, environmental (pastor on call), with h/o AF, HTN, HLD, aortic atherosclerosis by CT, OSA on CPAP, LPR, prostate cancer seen today for routine electrophysiology followup.   Since last being seen in our clinic the patient reports doing well.  He had a pulmonary vein ablation in October 2024 and has not had any evidence of atrial fibrillation since that time.  He monitors with an Scientist, physiological.  Recently he fell off the porch and pulled ligaments in his left hand and had to be in a cast.  He has had difficulty wearing his watch due to dexterity issues associated with being in a cast.  He reports he recently retired.  His wife accompanies him him on his visit today.  He indicates he had CPAP prescriber him sometime ago and had not been wearing because he would pull it off in the middle of the night.  He feels he needs to wear it as of late.  He has had difficulties with dizziness and had an evaluation with neurology which did not necessarily point to a cause of his dizziness.  He denies chest pain, palpitations, dyspnea, PND, orthopnea, nausea, vomiting, dizziness, syncope, edema, weight gain, or early satiety.   Review of systems complete and found to be negative unless listed in HPI.   EP Information / Studies Reviewed:    EKG is not ordered today. EKG from 01/11/23 reviewed which showed SR with PAC's 81 bpm      Studies:  CT Cardiac Morph / Pulm 10/08/22 > normal PV drainage into the LA, no PFO/ASD, normal coronary origin / right dominance, CAC 1327 / 80th percentile for matched controls  EPS 10/17/22 > successful PVI, ablation / isolation of posterior wall  Arrhythmia / AAD AF > initial dx 04/2021  DCCV  05/2021, 07/2022 (ERAF after 1 week)    Risk Assessment/Calculations:    CHA2DS2-VASc Score = 4   This indicates a 4.8% annual risk of stroke. The patient's score is based upon: CHF History: 0 HTN History: 1 Diabetes History: 0 Stroke History: 0 Vascular Disease History: 1 Age Score: 2 Gender Score: 0             Physical Exam:   VS:  BP 138/78   Pulse 77   Ht 5' 11 (1.803 m)   Wt 191 lb 3.2 oz (86.7 kg)   SpO2 94%   BMI 26.67 kg/m    Wt Readings from Last 3 Encounters:  07/10/23 191 lb 3.2 oz (86.7 kg)  01/11/23 183 lb (83 kg)  11/14/22 182 lb 12.8 oz (82.9 kg)     GEN: Well nourished, well developed in no acute distress NECK: No JVD; No carotid bruits CARDIAC: Regular rate and rhythm (NSR on exam), no murmurs, rubs, gallops RESPIRATORY:  Clear to auscultation without rales, wheezing or rhonchi  ABDOMEN: Soft, non-tender, non-distended EXTREMITIES:  No edema; No deformity   ASSESSMENT AND PLAN:    Persistent Atrial Fibrillation  CHA2DS2-VASc 4, s/p PVI + PW ablation 10/2022  -continue OAC for stroke prophylaxis  -no AF burden since ablation    -monitors with Apple Watch  Secondary Hypercoagulable State  -continue Eliquis  5mg  BID, dose reviewed and appropriate by age /  wt  -Watchman procedure reviewed with patient   OSA  -CPAP compliance encouraged    Dizziness  -no arrhythmia to explain as remains in NSR  -consider ENT evaluation / discussed with patient   Follow up with Dr. Kennyth in 6 months  Signed, Daphne Barrack, NP-C, AGACNP-BC [facility] - Electrophysiology  07/10/2023, 6:29 PM

## 2023-07-10 ENCOUNTER — Ambulatory Visit: Attending: Pulmonary Disease | Admitting: Pulmonary Disease

## 2023-07-10 ENCOUNTER — Encounter: Payer: Self-pay | Admitting: Pulmonary Disease

## 2023-07-10 VITALS — BP 138/78 | HR 77 | Ht 71.0 in | Wt 191.2 lb

## 2023-07-10 DIAGNOSIS — D6869 Other thrombophilia: Secondary | ICD-10-CM | POA: Diagnosis not present

## 2023-07-10 DIAGNOSIS — I4819 Other persistent atrial fibrillation: Secondary | ICD-10-CM

## 2023-07-10 DIAGNOSIS — G4733 Obstructive sleep apnea (adult) (pediatric): Secondary | ICD-10-CM | POA: Diagnosis not present

## 2023-07-10 NOTE — Patient Instructions (Signed)
 Medication Instructions:  Your physician recommends that you continue on your current medications as directed. Please refer to the Current Medication list given to you today.  *If you need a refill on your cardiac medications before your next appointment, please call your pharmacy*  Lab Work: None ordered If you have labs (blood work) drawn today and your tests are completely normal, you will receive your results only by: MyChart Message (if you have MyChart) OR A paper copy in the mail If you have any lab test that is abnormal or we need to change your treatment, we will call you to review the results.  Follow-Up: At [facility], you and your health needs are our priority.  As part of our continuing mission to provide you with exceptional heart care, our providers are all part of one team.  This team includes your primary Cardiologist (physician) and Advanced Practice Providers or APPs (Physician Assistants and Nurse Practitioners) who all work together to provide you with the care you need, when you need it.  Your next appointment:   6 month(s)  Provider:   You may see Fonda Kitty, MD or one of the following Advanced Practice Providers on your designated Care Team:   Charlies Arthur, PA-C Michael Andy Tillery, PA-C Suzann Riddle, NP Daphne Barrack, NP  Other instructions: 1.Be sure to wear your CPAP at night 2.Wear compression stockings 3.Change positions slowly 4.Stay hydrated

## 2023-07-12 DIAGNOSIS — S63642D Sprain of metacarpophalangeal joint of left thumb, subsequent encounter: Secondary | ICD-10-CM | POA: Diagnosis not present

## 2023-07-12 DIAGNOSIS — M79645 Pain in left finger(s): Secondary | ICD-10-CM | POA: Diagnosis not present

## 2023-07-16 DIAGNOSIS — F411 Generalized anxiety disorder: Secondary | ICD-10-CM | POA: Diagnosis not present

## 2023-07-16 DIAGNOSIS — F332 Major depressive disorder, recurrent severe without psychotic features: Secondary | ICD-10-CM | POA: Diagnosis not present

## 2023-07-18 DIAGNOSIS — R300 Dysuria: Secondary | ICD-10-CM | POA: Diagnosis not present

## 2023-07-18 DIAGNOSIS — R3915 Urgency of urination: Secondary | ICD-10-CM | POA: Diagnosis not present

## 2023-07-18 DIAGNOSIS — R350 Frequency of micturition: Secondary | ICD-10-CM | POA: Diagnosis not present

## 2023-07-19 DIAGNOSIS — S63642D Sprain of metacarpophalangeal joint of left thumb, subsequent encounter: Secondary | ICD-10-CM | POA: Diagnosis not present

## 2023-07-30 DIAGNOSIS — F332 Major depressive disorder, recurrent severe without psychotic features: Secondary | ICD-10-CM | POA: Diagnosis not present

## 2023-07-30 DIAGNOSIS — S63642D Sprain of metacarpophalangeal joint of left thumb, subsequent encounter: Secondary | ICD-10-CM | POA: Diagnosis not present

## 2023-07-30 DIAGNOSIS — F411 Generalized anxiety disorder: Secondary | ICD-10-CM | POA: Diagnosis not present

## 2023-07-31 DIAGNOSIS — F419 Anxiety disorder, unspecified: Secondary | ICD-10-CM | POA: Diagnosis not present

## 2023-07-31 DIAGNOSIS — R413 Other amnesia: Secondary | ICD-10-CM | POA: Diagnosis not present

## 2023-07-31 DIAGNOSIS — I10 Essential (primary) hypertension: Secondary | ICD-10-CM | POA: Diagnosis not present

## 2023-07-31 DIAGNOSIS — R42 Dizziness and giddiness: Secondary | ICD-10-CM | POA: Diagnosis not present

## 2023-08-02 DIAGNOSIS — H0015 Chalazion left lower eyelid: Secondary | ICD-10-CM | POA: Diagnosis not present

## 2023-08-07 DIAGNOSIS — I10 Essential (primary) hypertension: Secondary | ICD-10-CM | POA: Diagnosis not present

## 2023-08-08 DIAGNOSIS — R351 Nocturia: Secondary | ICD-10-CM | POA: Diagnosis not present

## 2023-08-08 DIAGNOSIS — S63642D Sprain of metacarpophalangeal joint of left thumb, subsequent encounter: Secondary | ICD-10-CM | POA: Diagnosis not present

## 2023-08-08 DIAGNOSIS — N401 Enlarged prostate with lower urinary tract symptoms: Secondary | ICD-10-CM | POA: Diagnosis not present

## 2023-08-09 DIAGNOSIS — M170 Bilateral primary osteoarthritis of knee: Secondary | ICD-10-CM | POA: Diagnosis not present

## 2023-08-12 ENCOUNTER — Other Ambulatory Visit: Payer: Self-pay

## 2023-08-12 ENCOUNTER — Ambulatory Visit: Attending: Family Medicine | Admitting: Physical Therapy

## 2023-08-12 ENCOUNTER — Encounter: Payer: Self-pay | Admitting: Physical Therapy

## 2023-08-12 DIAGNOSIS — M6281 Muscle weakness (generalized): Secondary | ICD-10-CM | POA: Diagnosis not present

## 2023-08-12 DIAGNOSIS — R2689 Other abnormalities of gait and mobility: Secondary | ICD-10-CM | POA: Insufficient documentation

## 2023-08-12 DIAGNOSIS — R262 Difficulty in walking, not elsewhere classified: Secondary | ICD-10-CM | POA: Insufficient documentation

## 2023-08-12 DIAGNOSIS — R2681 Unsteadiness on feet: Secondary | ICD-10-CM | POA: Diagnosis not present

## 2023-08-12 NOTE — Therapy (Signed)
 OUTPATIENT PHYSICAL THERAPY VESTIBULAR EVALUATION     Patient Name: Wesley [name] MRN: [id number] DOB:08/18/1945, 78 y.o., male Today's Date: 08/12/2023  END OF SESSION:  PT End of Session - 08/12/23 0902     Visit Number 1    Number of Visits 13    Date for PT Re-Evaluation 09/27/23    Authorization Type [employer]    PT Start Time 0804    PT Stop Time 0846    PT Time Calculation (min) 42 min    Equipment Utilized During Treatment --   will need gait belt due to imbalance, hx of falls   Activity Tolerance Patient tolerated treatment well    Behavior During Therapy WFL for tasks assessed/performed          Past Medical History:  Diagnosis Date   Depression    Diverticulosis    Hyperlipidemia    Hypertension    Migraine    OSA on CPAP    mild obstructive sleep apnea with an AHI of 8 and O2 saturations as low as 85%.  On CPAP 10cm H2O   Past Surgical History:  Procedure Laterality Date   ATRIAL FIBRILLATION ABLATION N/A 10/17/2022   Procedure: ATRIAL FIBRILLATION ABLATION;  Surgeon: Kennyth Chew, MD;  Location: [facility] INVASIVE CV LAB;  Service: Cardiovascular;  Laterality: N/A;   CARDIOVERSION N/A 05/24/2021   Procedure: CARDIOVERSION;  Surgeon: Kate Lonni CROME, MD;  Location: [facility] ENDOSCOPY;  Service: Cardiovascular;  Laterality: N/A;   CARDIOVERSION N/A 07/31/2022   Procedure: CARDIOVERSION;  Surgeon: Sheena Pugh, DO;  Location: [facility] INVASIVE CV LAB;  Service: Cardiovascular;  Laterality: N/A;   CYSTOSCOPY WITH INSERTION OF UROLIFT N/A 04/28/2019   Procedure: CYSTOSCOPY WITH INSERTION OF UROLIFT;  Surgeon: Nieves Cough, MD;  Location: [facility];  Service: Urology;  Laterality: N/A;   GANGLION CYST EXCISION     PROSTATE BIOPSY N/A 04/28/2019   Procedure: BIOPSY TRANSRECTAL ULTRASONIC PROSTATE (TUBP);  Surgeon: Nieves Cough, MD;  Location: [facility];  Service: Urology;  Laterality: N/A;   Patient Active Problem List    Diagnosis Date Noted   Hypercoagulable state due to persistent atrial fibrillation (HCC) 08/23/2022   OSA on CPAP 01/03/2022   Persistent atrial fibrillation (HCC)    Diverticulitis of colon with perforation 12/18/2020   Bruxism 05/07/2017   Laryngopharyngeal reflux (LPR) 05/07/2017   Presbycusis of both ears 05/07/2017   Hemorrhoids, external, thrombosed 02/13/2012    PCP: Chrystal Lamarr RAMAN, MD  REFERRING PROVIDER: Chrystal Lamarr RAMAN, MD   REFERRING DIAG: R42 (ICD-10-CM) - Dizziness   THERAPY DIAG:  Unsteadiness on feet  Other abnormalities of gait and mobility  ONSET DATE: 08/06/2023 (MD referral)  Rationale for Evaluation and Treatment: Rehabilitation  SUBJECTIVE:   SUBJECTIVE STATEMENT: There is emotional component of all of this-I have a lot of my plate.  I'm trying to figure out why I'm dizzy and lightheaded and falling.  I had a fall 8 weeks ago and tore a ligament in L thumb (repaired by Dr. Ahmad and doing hand therapy).  Had MRI done (see below).  I've had a neurological exam-but was concerned about how that went.  Was here for PT in the spring and then have had further assessment with neurologist.  Appreciated the things we worked on before and I want to get back to the balance issues.  Walking makes me feel disoriented/imbalanced. Pt accompanied by: self  PERTINENT HISTORY: reported memory issues, BPH, prostate cancer (possible upcoming surgery for  the enlarged prostate); atrial fibrillation, hypertension, hyperlipidemia, OSA, osteoarthritis, migraine headaches, history of kidney stones, diverticulosis, anxiety, depression, and overweight state, who reports problems with his balance and dizziness since he was diagnosed with A-fib about a year or year and a half ago   PAIN:  Are you having pain? No  PRECAUTIONS: Fall and Other: wears L hand splint  RED FLAGS: None   WEIGHT BEARING RESTRICTIONS: No  FALLS: Has patient fallen in last 6 months? Yes.  Number of falls 1-stepped down off the patio and lost balance  LIVING ENVIRONMENT: Lives with: lives with their spouse Lives in: House/apartment Stairs: bilateral rails for steps to enter home; bedroom on first level Has following equipment at home: None  PLOF: Independent, Vocation/Vocational requirements: retired Optician, dispensing, and Leisure: enjoys family time, church  PATIENT GOALS: To help with balance  OBJECTIVE:  Note: Objective measures were completed at Evaluation unless otherwise noted.  DIAGNOSTIC FINDINGS: MRI 06/26/2023 IMPRESSION: 1. No acute intracranial abnormality. 2. Mild-to-moderate chronic small vessel ischemic disease. 3. Moderate to severe cerebral atrophy.  COGNITION: Overall cognitive status: History of cognitive impairments - at baseline and reports memory/word finding changes   SENSATION: Light touch: WFL*does have question about sensation, as he lost a toenail and didn't know it  POSTURE:  rounded shoulders and forward head  Cervical ROM:  crepitus, but wfL  Active A/PROM (deg) eval  Flexion   Extension   Right lateral flexion   Left lateral flexion   Right rotation   Left rotation   (Blank rows = not tested)   LOWER EXTREMITY MMT:   MMT Right eval Left eval  Hip flexion 4 5  Hip abduction 4 4  Hip adduction 4+ 4+  Hip internal rotation    Hip external rotation    Knee flexion 5 5  Knee extension 5 5  Ankle dorsiflexion 4 4  Ankle plantarflexion    Ankle inversion    Ankle eversion    (Blank rows = not tested)  TRANSFERS: Assistive device utilized: None  Sit to stand: Modified independence Stand to sit: Modified independence  GAIT: Gait pattern: step through pattern, ataxic, and wide BOS Distance walked: 50 ft Assistive device utilized: None Level of assistance: SBA Comments: Looks down at feet with gait  FUNCTIONAL TESTS:  5 times sit to stand: 18.94 sec with knee pain Timed up and go (TUG): 15.94 sec 10 meter walk test:  TBA FGA:  TBA  M-CTSIB  Condition 1: Firm Surface, EO 30 Sec, Normal Sway  Condition 2: Firm Surface, EC 30 Sec, Mild Sway (*this is the dizziness)  Condition 3: Foam Surface, EO 30 Sec, Mild Sway  Condition 4: Foam Surface, EC 27.9 Sec, Severe Sway      VESTIBULAR ASSESSMENT:  GENERAL OBSERVATION: cautious, guarded motion   SYMPTOM BEHAVIOR:  Subjective history: Feels like head is spinning, like I'm not firmly planted on the ground.  Don't feel stable standing.  Been going on for several years.  Non-Vestibular symptoms: tinnitus-long standing  Type of dizziness: Imbalance (Disequilibrium) and Unsteady with head/body turns  Frequency: daily, anytime I get up  Duration: while I'm up  Aggravating factors: Induced by motion: occur when walking, activity in general, and standing  Relieving factors: sitting  Progression of symptoms: unchanged  *DID NOT COMPLETE FURTHER VESTIBULAR COMPONENT OF ASSESSMENT, as pt reports more unsteadiness with standing/gait as primary symptom  TREATMENT DATE: 08/12/2023   PATIENT EDUCATION: Education details: Eval results, POC Person educated: Patient Education method: Explanation Education comprehension: verbalized understanding  HOME EXERCISE PROGRAM:  GOALS: Goals reviewed with patient? Yes  SHORT TERM GOALS: Target date: 08/30/2023  Pt will be independent with HEP for improved balance, gait. Baseline: Goal status: INITIAL  2.  Pt will improve 5x sit<>stand to less than or equal to 15 sec to demonstrate improved functional strength and transfer efficiency. Baseline: 18.94 sec Goal status: INITIAL   LONG TERM GOALS: Target date: 09/27/2023  Pt will be independent with HEP for improved balance, gait. Baseline:  Goal status: INITIAL  2.  Pt will improve TUG score to less than or equal to 13.5  sec for decreased fall  risk. Baseline: 15.94 sec Goal status: INITIAL  3.  Pt will improve FGA score to at least 23/30 to decrease fall risk. Baseline: TBA Goal status: INITIAL  4.  Pt will perform Condition 4 on MCTSIB 30 sec with moderate or less sway for improved balance. Baseline: 27.9 sec severe sway Goal status: INITIAL  5.  Pt will verbalize understanding of fall prevention in home environment. Baseline:  Goal status: INITIAL  6.  Pt will verbalize plans for continued community fitness upon d/c from PT, to maximize gains in PT. Baseline: No current HEP Goal status: INITIAL  ASSESSMENT:  CLINICAL IMPRESSION: Patient is a 78 y.o. male who was seen today for physical therapy evaluation and treatment for dizziness, imbalance.  He is known to this clinic from previous bouts of therapy.  Since last bout of therapy in spring of 2025, pt has undergone additional tests-MRI and had neurologist visit; he has had a fall where he sustained torn ligament in L thumb.  He continues to report memory/word finding issues and is looking into a clinic at [facility] for memory.  He presents today with decreased balance, decreased functional strength, decreased safety/timing/coordination with gait, hx of falls.  He is at increased fall risk per TUG and FTSTS scores; he has decreased vestibular system use for balance, as evidenced by increased sway on Condition 4 of MCTSIB.  He describes his dizziness more as imbalance with standing and walking.  He would benefit from skilled PT to address the above stated deficits to decrease fall risk and to improve overall functional mobility and independence.   OBJECTIVE IMPAIRMENTS: Abnormal gait, decreased balance, decreased knowledge of use of DME, decreased mobility, difficulty walking, and decreased strength.   ACTIVITY LIMITATIONS: bending, standing, squatting, transfers, and locomotion level  PARTICIPATION LIMITATIONS: shopping, community activity, and occupation  PERSONAL FACTORS: 3+  comorbidities: see above are also affecting patient's functional outcome.   REHAB POTENTIAL: Good  CLINICAL DECISION MAKING: Evolving/moderate complexity  EVALUATION COMPLEXITY: Moderate   PLAN:  PT FREQUENCY: 1-2x/week  PT DURATION: 6 weeks plus eval  PLANNED INTERVENTIONS: 97750- Physical Performance Testing, 97110-Therapeutic exercises, 97530- Therapeutic activity, [id number]- Neuromuscular re-education, 97535- Self Care, [id number]- Manual therapy, [id number]- Gait training, [phone]- Aquatic Therapy, Patient/Family education, Balance training, and Vestibular training  PLAN FOR NEXT SESSION: Initiate HEP for balance.  Will need to complete FGA, 10 M walk and any further vestibular testing (he doesn't seem to complain of spinning in the sense of vertigo, so I did not proceed with vestibular portion of eval)   Shantaya Bluestone W., PT 08/12/2023, 9:04 AM   [facility] [address] [place], [place] [address] Phone # [phone] Fax # [phone]  Referring diagnosis?  R42 (ICD-10-CM) - Dizziness  Treatment diagnosis? (if different than referring diagnosis) R26.81, R 26.89 What was this (referring dx) caused by? []  Surgery [x]  Fall [x]  Ongoing issue []  Arthritis []  Other: ____________  Laterality: []  Rt []  Lt [x]  Both  Check all possible CPT codes:  *CHOOSE 10 OR LESS*    See Planned Interventions listed in the Plan section of the Evaluation.

## 2023-08-14 DIAGNOSIS — F411 Generalized anxiety disorder: Secondary | ICD-10-CM | POA: Diagnosis not present

## 2023-08-14 DIAGNOSIS — F332 Major depressive disorder, recurrent severe without psychotic features: Secondary | ICD-10-CM | POA: Diagnosis not present

## 2023-08-14 NOTE — Therapy (Signed)
 OUTPATIENT PHYSICAL THERAPY VESTIBULAR TREATMENT     Patient Name: Wesley [name] MRN: [id number] DOB:Jan 22, 1945, 78 y.o., male Today's Date: 08/19/2023  END OF SESSION:  PT End of Session - 08/19/23 1011     Number of Visits 13    Date for PT Re-Evaluation 09/27/23    Authorization Type [employer]    Authorization Time Period approved 13 PT visits from 08/12/2023-09/27/2023    Authorization - Visit Number 2    Authorization - Number of Visits 13    PT Start Time 0932    PT Stop Time 1014    PT Time Calculation (min) 42 min    Equipment Utilized During Treatment Gait belt   will need gait belt d/t imbalance, hx of falls   Activity Tolerance Patient tolerated treatment well    Behavior During Therapy WFL for tasks assessed/performed           Past Medical History:  Diagnosis Date   Depression    Diverticulosis    Hyperlipidemia    Hypertension    Migraine    OSA on CPAP    mild obstructive sleep apnea with an AHI of 8 and O2 saturations as low as 85%.  On CPAP 10cm H2O   Past Surgical History:  Procedure Laterality Date   ATRIAL FIBRILLATION ABLATION N/A 10/17/2022   Procedure: ATRIAL FIBRILLATION ABLATION;  Surgeon: Kennyth Chew, MD;  Location: [facility] INVASIVE CV LAB;  Service: Cardiovascular;  Laterality: N/A;   CARDIOVERSION N/A 05/24/2021   Procedure: CARDIOVERSION;  Surgeon: Kate Lonni CROME, MD;  Location: [facility] ENDOSCOPY;  Service: Cardiovascular;  Laterality: N/A;   CARDIOVERSION N/A 07/31/2022   Procedure: CARDIOVERSION;  Surgeon: Sheena Pugh, DO;  Location: [facility] INVASIVE CV LAB;  Service: Cardiovascular;  Laterality: N/A;   CYSTOSCOPY WITH INSERTION OF UROLIFT N/A 04/28/2019   Procedure: CYSTOSCOPY WITH INSERTION OF UROLIFT;  Surgeon: Nieves Cough, MD;  Location: [facility];  Service: Urology;  Laterality: N/A;   GANGLION CYST EXCISION     PROSTATE BIOPSY N/A 04/28/2019   Procedure: BIOPSY TRANSRECTAL ULTRASONIC PROSTATE (TUBP);   Surgeon: Nieves Cough, MD;  Location: [facility];  Service: Urology;  Laterality: N/A;   Patient Active Problem List   Diagnosis Date Noted   Hypercoagulable state due to persistent atrial fibrillation (HCC) 08/23/2022   OSA on CPAP 01/03/2022   Persistent atrial fibrillation (HCC)    Diverticulitis of colon with perforation 12/18/2020   Bruxism 05/07/2017   Laryngopharyngeal reflux (LPR) 05/07/2017   Presbycusis of both ears 05/07/2017   Hemorrhoids, external, thrombosed 02/13/2012    PCP: Chrystal Lamarr RAMAN, MD  REFERRING PROVIDER: Chrystal Lamarr RAMAN, MD   REFERRING DIAG: R42 (ICD-10-CM) - Dizziness   THERAPY DIAG:  Unsteadiness on feet  Other abnormalities of gait and mobility  ONSET DATE: 08/06/2023 (MD referral)  Rationale for Evaluation and Treatment: Rehabilitation  SUBJECTIVE:   SUBJECTIVE STATEMENT: Nothing new since last session. Reports that he is in therapy for his thumb s/p surgery. Reports that he was told that the hand splint is optional- not wearing it today. Denies recent falls.    Pt accompanied by: self  PERTINENT HISTORY: reported memory issues, BPH, prostate cancer (possible upcoming surgery for the enlarged prostate); atrial fibrillation, hypertension, hyperlipidemia, OSA, osteoarthritis, migraine headaches, history of kidney stones, diverticulosis, anxiety, depression, and overweight state, who reports problems with his balance and dizziness since he was diagnosed with A-fib about a year or year and a half ago   PAIN:  Are you having pain? No  PRECAUTIONS: Fall and Other: wears L hand splint  RED FLAGS: None   WEIGHT BEARING RESTRICTIONS: No  FALLS: Has patient fallen in last 6 months? Yes. Number of falls 1-stepped down off the patio and lost balance  LIVING ENVIRONMENT: Lives with: lives with their spouse Lives in: House/apartment Stairs: bilateral rails for steps to enter home; bedroom on first level Has  following equipment at home: None  PLOF: Independent, Vocation/Vocational requirements: retired Optician, dispensing, and Leisure: enjoys family time, church  PATIENT GOALS: To help with balance  OBJECTIVE:      TODAY'S TREATMENT: 08/19/23 Activity Comments  Vitals at start of session  161/94 mmHg  Vitals recheck after diaphragmatic breathing break  146/91 mmHg, 64 bpm   FGA 16/30  10M walk 12.3 sec without AD, (2.67 ft/sec)  Review of initial HEP at TM rail: Tandem walk Backwards walk 1/4 turns Chair support for turns. Cueing to pick up feet to improve foot clearance throughout. Good safety with these activities            [facility] PT Assessment - 08/19/23 0001       Functional Gait  Assessment   Gait assessed  Yes    Gait Level Surface Walks 20 ft in less than 7 sec but greater than 5.5 sec, uses assistive device, slower speed, mild gait deviations, or deviates 6-10 in outside of the 12 in walkway width.    Change in Gait Speed Able to change speed, demonstrates mild gait deviations, deviates 6-10 in outside of the 12 in walkway width, or no gait deviations, unable to achieve a major change in velocity, or uses a change in velocity, or uses an assistive device.    Gait with Horizontal Head Turns Performs head turns smoothly with slight change in gait velocity (eg, minor disruption to smooth gait path), deviates 6-10 in outside 12 in walkway width, or uses an assistive device.    Gait with Vertical Head Turns Performs task with slight change in gait velocity (eg, minor disruption to smooth gait path), deviates 6 - 10 in outside 12 in walkway width or uses assistive device    Gait and Pivot Turn Turns slowly, requires verbal cueing, or requires several small steps to catch balance following turn and stop    Step Over Obstacle Is able to step over 2 stacked shoe boxes taped together (9 in total height) without changing gait speed. No evidence of imbalance.    Gait with Narrow Base of Support  Ambulates less than 4 steps heel to toe or cannot perform without assistance.    Gait with Eyes Closed Walks 20 ft, slow speed, abnormal gait pattern, evidence for imbalance, deviates 10-15 in outside 12 in walkway width. Requires more than 9 sec to ambulate 20 ft.    Ambulating Backwards Cannot walk 20 ft without assistance, severe gait deviations or imbalance, deviates greater than 15 in outside 12 in walkway width or will not attempt task.   1 episode of imbalance requiring min A   Steps Alternating feet, no rail.    Total Score 16          HOME EXERCISE PROGRAM Last updated: 08/19/23 Access Code: [email or web address] URL: [email or web address] Date: 08/19/2023 Prepared by: [facility] - Outpatient  Rehab - [facility]  Program Notes perform at counter for safety  Exercises - Standing Quarter Turn with Counter Support  - 1 x daily - 5 x weekly - 2 sets - 10 reps - Tandem  Walking with Counter Support  - 1 x daily - 5 x weekly - 2 sets - 5 reps - Backward Walking with Counter Support  - 1 x daily - 5 x weekly - 2 sets - 5 reps    PATIENT EDUCATION: Education details: exam findings, edu on falls risk and suggested use of SPC to address imbalance and reduce falls risk. Discussed pt's several medical concerns and workup for memory concerns, HEP with edu for safety   Person educated: Patient Education method: Explanation, Demonstration, Tactile cues, Verbal cues, and Handouts Education comprehension: verbalized understanding and returned demonstration    Note: Objective measures were completed at Evaluation unless otherwise noted.  DIAGNOSTIC FINDINGS: MRI 06/26/2023 IMPRESSION: 1. No acute intracranial abnormality. 2. Mild-to-moderate chronic small vessel ischemic disease. 3. Moderate to severe cerebral atrophy.  COGNITION: Overall cognitive status: History of cognitive impairments - at baseline and reports memory/word finding changes   SENSATION: Light touch: WFL*does  have question about sensation, as he lost a toenail and didn't know it  POSTURE:  rounded shoulders and forward head  Cervical ROM:  crepitus, but wfL  Active A/PROM (deg) eval  Flexion   Extension   Right lateral flexion   Left lateral flexion   Right rotation   Left rotation   (Blank rows = not tested)   LOWER EXTREMITY MMT:   MMT Right eval Left eval  Hip flexion 4 5  Hip abduction 4 4  Hip adduction 4+ 4+  Hip internal rotation    Hip external rotation    Knee flexion 5 5  Knee extension 5 5  Ankle dorsiflexion 4 4  Ankle plantarflexion    Ankle inversion    Ankle eversion    (Blank rows = not tested)  TRANSFERS: Assistive device utilized: None  Sit to stand: Modified independence Stand to sit: Modified independence  GAIT: Gait pattern: step through pattern, ataxic, and wide BOS Distance walked: 50 ft Assistive device utilized: None Level of assistance: SBA Comments: Looks down at feet with gait  FUNCTIONAL TESTS:  5 times sit to stand: 18.94 sec with knee pain Timed up and go (TUG): 15.94 sec 10 meter walk test: TBA FGA:  TBA  M-CTSIB  Condition 1: Firm Surface, EO 30 Sec, Normal Sway  Condition 2: Firm Surface, EC 30 Sec, Mild Sway (*this is the dizziness)  Condition 3: Foam Surface, EO 30 Sec, Mild Sway  Condition 4: Foam Surface, EC 27.9 Sec, Severe Sway      VESTIBULAR ASSESSMENT:  GENERAL OBSERVATION: cautious, guarded motion   SYMPTOM BEHAVIOR:  Subjective history: Feels like head is spinning, like I'm not firmly planted on the ground.  Don't feel stable standing.  Been going on for several years.  Non-Vestibular symptoms: tinnitus-long standing  Type of dizziness: Imbalance (Disequilibrium) and Unsteady with head/body turns  Frequency: daily, anytime I get up  Duration: while I'm up  Aggravating factors: Induced by motion: occur when walking, activity in general, and standing  Relieving factors: sitting  Progression of symptoms:  unchanged  *DID NOT COMPLETE FURTHER VESTIBULAR COMPONENT OF ASSESSMENT, as pt reports more unsteadiness with standing/gait as primary symptom  TREATMENT DATE: 08/12/2023   PATIENT EDUCATION: Education details: Eval results, POC Person educated: Patient Education method: Explanation Education comprehension: verbalized understanding  HOME EXERCISE PROGRAM:  GOALS: Goals reviewed with patient? Yes  SHORT TERM GOALS: Target date: 08/30/2023  Pt will be independent with HEP for improved balance, gait. Baseline: Goal status: IN PROGRESS  2.  Pt will improve 5x sit<>stand to less than or equal to 15 sec to demonstrate improved functional strength and transfer efficiency. Baseline: 18.94 sec Goal status: IN PROGRESS   LONG TERM GOALS: Target date: 09/27/2023  Pt will be independent with HEP for improved balance, gait. Baseline:  Goal status: IN PROGRESS  2.  Pt will improve TUG score to less than or equal to 13.5  sec for decreased fall risk. Baseline: 15.94 sec Goal status: IN PROGRESS  3.  Pt will improve FGA score to at least 23/30 to decrease fall risk. Baseline: 16/30 08/19/23 Goal status: IN PROGRESS 08/19/23  4.  Pt will perform Condition 4 on MCTSIB 30 sec with moderate or less sway for improved balance. Baseline: 27.9 sec severe sway Goal status: IN PROGRESS  5.  Pt will verbalize understanding of fall prevention in home environment. Baseline:  Goal status: IN PROGRESS  6.  Pt will verbalize plans for continued community fitness upon d/c from PT, to maximize gains in PT. Baseline: No current HEP Goal status: IN PROGRESS  ASSESSMENT:  CLINICAL IMPRESSION: Patient arrived to session without new complaints. Vitals at start of session revealed elevated systolic BP which improved after recheck. Patient scored 16/30 on FGA, indicating an increased  risk of falls. Gait speed today was 2.67 ft/sec, indicating reduced access to community. Patient demonstrated good safety with initial HEP provided today and without complaints upon leaving.    OBJECTIVE IMPAIRMENTS: Abnormal gait, decreased balance, decreased knowledge of use of DME, decreased mobility, difficulty walking, and decreased strength.   ACTIVITY LIMITATIONS: bending, standing, squatting, transfers, and locomotion level  PARTICIPATION LIMITATIONS: shopping, community activity, and occupation  PERSONAL FACTORS: 3+ comorbidities: see above are also affecting patient's functional outcome.   REHAB POTENTIAL: Good  CLINICAL DECISION MAKING: Evolving/moderate complexity  EVALUATION COMPLEXITY: Moderate   PLAN:  PT FREQUENCY: 1-2x/week  PT DURATION: 6 weeks plus eval  PLANNED INTERVENTIONS: 97750- Physical Performance Testing, 97110-Therapeutic exercises, 97530- Therapeutic activity, [id number]- Neuromuscular re-education, 97535- Self Care, [id number]- Manual therapy, [id number]- Gait training, [phone]- Aquatic Therapy, Patient/Family education, Balance training, and Vestibular training  PLAN FOR NEXT SESSION: review HEP for balance.  Will need to complete any further vestibular testing (he doesn't seem to complain of spinning in the sense of vertigo, so I did not proceed with vestibular portion of eval). Recommended [facility]- practice this in clinic     Louana Terrilyn Christians, Steen, DPT 08/19/23 10:16 AM  [facility] at [facility] [address] [place], [address] [place], [place] [address] Phone # [phone] Fax # [phone]

## 2023-08-16 DIAGNOSIS — S63642D Sprain of metacarpophalangeal joint of left thumb, subsequent encounter: Secondary | ICD-10-CM | POA: Diagnosis not present

## 2023-08-19 ENCOUNTER — Ambulatory Visit: Admitting: Physical Therapy

## 2023-08-19 ENCOUNTER — Encounter: Payer: Self-pay | Admitting: Physical Therapy

## 2023-08-19 DIAGNOSIS — R262 Difficulty in walking, not elsewhere classified: Secondary | ICD-10-CM | POA: Diagnosis not present

## 2023-08-19 DIAGNOSIS — R2681 Unsteadiness on feet: Secondary | ICD-10-CM

## 2023-08-19 DIAGNOSIS — R2689 Other abnormalities of gait and mobility: Secondary | ICD-10-CM

## 2023-08-19 DIAGNOSIS — M6281 Muscle weakness (generalized): Secondary | ICD-10-CM | POA: Diagnosis not present

## 2023-08-22 ENCOUNTER — Ambulatory Visit: Admitting: Physical Therapy

## 2023-08-22 ENCOUNTER — Encounter: Payer: Self-pay | Admitting: Physical Therapy

## 2023-08-22 DIAGNOSIS — M6281 Muscle weakness (generalized): Secondary | ICD-10-CM | POA: Diagnosis not present

## 2023-08-22 DIAGNOSIS — R2689 Other abnormalities of gait and mobility: Secondary | ICD-10-CM | POA: Diagnosis not present

## 2023-08-22 DIAGNOSIS — R2681 Unsteadiness on feet: Secondary | ICD-10-CM | POA: Diagnosis not present

## 2023-08-22 DIAGNOSIS — R262 Difficulty in walking, not elsewhere classified: Secondary | ICD-10-CM | POA: Diagnosis not present

## 2023-08-22 NOTE — Therapy (Signed)
 OUTPATIENT PHYSICAL THERAPY VESTIBULAR TREATMENT     Patient Name: Wesley [name] MRN: [id number] DOB:14-Jul-1945, 78 y.o., male Today's Date: 08/22/2023  END OF SESSION:  PT End of Session - 08/22/23 0919     Visit Number 3    Number of Visits 13    Date for PT Re-Evaluation 09/27/23    Authorization Type [employer]    Authorization Time Period approved 13 PT visits from 08/12/2023-09/27/2023    Authorization - Visit Number 3    Authorization - Number of Visits 13    PT Start Time [phone]    PT Stop Time 1005    PT Time Calculation (min) 40 min    Equipment Utilized During Treatment Gait belt   will need gait belt d/t imbalance, hx of falls   Activity Tolerance Patient tolerated treatment well    Behavior During Therapy WFL for tasks assessed/performed           Past Medical History:  Diagnosis Date   Depression    Diverticulosis    Hyperlipidemia    Hypertension    Migraine    OSA on CPAP    mild obstructive sleep apnea with an AHI of 8 and O2 saturations as low as 85%.  On CPAP 10cm H2O   Past Surgical History:  Procedure Laterality Date   ATRIAL FIBRILLATION ABLATION N/A 10/17/2022   Procedure: ATRIAL FIBRILLATION ABLATION;  Surgeon: Kennyth Chew, MD;  Location: [facility] INVASIVE CV LAB;  Service: Cardiovascular;  Laterality: N/A;   CARDIOVERSION N/A 05/24/2021   Procedure: CARDIOVERSION;  Surgeon: Kate Lonni CROME, MD;  Location: [facility] ENDOSCOPY;  Service: Cardiovascular;  Laterality: N/A;   CARDIOVERSION N/A 07/31/2022   Procedure: CARDIOVERSION;  Surgeon: Sheena Pugh, DO;  Location: [facility] INVASIVE CV LAB;  Service: Cardiovascular;  Laterality: N/A;   CYSTOSCOPY WITH INSERTION OF UROLIFT N/A 04/28/2019   Procedure: CYSTOSCOPY WITH INSERTION OF UROLIFT;  Surgeon: Nieves Cough, MD;  Location: [facility];  Service: Urology;  Laterality: N/A;   GANGLION CYST EXCISION     PROSTATE BIOPSY N/A 04/28/2019   Procedure: BIOPSY TRANSRECTAL  ULTRASONIC PROSTATE (TUBP);  Surgeon: Nieves Cough, MD;  Location: [facility];  Service: Urology;  Laterality: N/A;   Patient Active Problem List   Diagnosis Date Noted   Hypercoagulable state due to persistent atrial fibrillation (HCC) 08/23/2022   OSA on CPAP 01/03/2022   Persistent atrial fibrillation (HCC)    Diverticulitis of colon with perforation 12/18/2020   Bruxism 05/07/2017   Laryngopharyngeal reflux (LPR) 05/07/2017   Presbycusis of both ears 05/07/2017   Hemorrhoids, external, thrombosed 02/13/2012    PCP: Chrystal Lamarr RAMAN, MD  REFERRING PROVIDER: Chrystal Lamarr RAMAN, MD   REFERRING DIAG: R42 (ICD-10-CM) - Dizziness   THERAPY DIAG:  Unsteadiness on feet  Other abnormalities of gait and mobility  Difficulty in walking, not elsewhere classified  Muscle weakness (generalized)  Balance problem  ONSET DATE: 08/06/2023 (MD referral)  Rationale for Evaluation and Treatment: Rehabilitation  SUBJECTIVE:   SUBJECTIVE STATEMENT: Pt states he took his splint off and was told by his surgeon he can take it off. Pt reports that dizziness might feel a little better today.    Pt accompanied by: self  PERTINENT HISTORY: reported memory issues, BPH, prostate cancer (possible upcoming surgery for the enlarged prostate); atrial fibrillation, hypertension, hyperlipidemia, OSA, osteoarthritis, migraine headaches, history of kidney stones, diverticulosis, anxiety, depression, and overweight state, who reports problems with his balance and dizziness since he was diagnosed  with A-fib about a year or year and a half ago   PAIN:  Are you having pain? No  PRECAUTIONS: Fall and Other: wears L hand splint  RED FLAGS: None   WEIGHT BEARING RESTRICTIONS: No  FALLS: Has patient fallen in last 6 months? Yes. Number of falls 1-stepped down off the patio and lost balance  LIVING ENVIRONMENT: Lives with: lives with their spouse Lives in:  House/apartment Stairs: bilateral rails for steps to enter home; bedroom on first level Has following equipment at home: None  PLOF: Independent, Vocation/Vocational requirements: retired Optician, dispensing, and Leisure: enjoys family time, church  PATIENT GOALS: To help with balance  OBJECTIVE:      TODAY'S TREATMENT: 08/22/23 Activity Comments  Nustep L5 x 6 min Warm up  VOR x1 horizontal and vertical 2x30 Standing in corner feet apart  Smooth pursuit horizontal and vertical 2x30 Standing in corner feet apart; corrective saccades noted especially with vertical movements  Saccades horizontal and vertical 2x30 Standing in corner feet apart  Static balance x30 Head turns 2x30 Head nods 2x30 Heel/toe 2x10 Slow march 2x10 Feet together on foam   Gait training with SPC 3x50' SBA Cues for cane negotiation if stabilizing to help with R foot stability  Ascend/descend 2x5 steps 4-6 height with SPC Cues for cane negotiation Intermittent step to pattern and reciprocal pattern No hand rail     08/19/23 Activity Comments  Vitals at start of session  161/94 mmHg  Vitals recheck after diaphragmatic breathing break  146/91 mmHg, 64 bpm   FGA 16/30  78M walk 12.3 sec without AD, (2.67 ft/sec)  Review of initial HEP at TM rail: Tandem walk Backwards walk 1/4 turns Chair support for turns. Cueing to pick up feet to improve foot clearance throughout. Good safety with these activities              HOME EXERCISE PROGRAM Last updated: 08/19/23 Access Code: [email or web address] URL: [email or web address] Date: 08/19/2023 Prepared by: [facility] - Outpatient  Rehab - [facility]  Program Notes perform at counter for safety  Exercises - Standing Quarter Turn with Counter Support  - 1 x daily - 5 x weekly - 2 sets - 10 reps - Tandem Walking with Counter Support  - 1 x daily - 5 x weekly - 2 sets - 5 reps - Backward Walking with Counter Support  - 1 x daily - 5 x weekly - 2 sets - 5  reps    PATIENT EDUCATION: Education details: HEP updates/modifications, SPC use Person educated: Patient Education method: Explanation, Demonstration, Tactile cues, Verbal cues, and Handouts Education comprehension: verbalized understanding and returned demonstration    Note: Objective measures were completed at Evaluation unless otherwise noted.  DIAGNOSTIC FINDINGS: MRI 06/26/2023 IMPRESSION: 1. No acute intracranial abnormality. 2. Mild-to-moderate chronic small vessel ischemic disease. 3. Moderate to severe cerebral atrophy.  COGNITION: Overall cognitive status: History of cognitive impairments - at baseline and reports memory/word finding changes   SENSATION: Light touch: WFL*does have question about sensation, as he lost a toenail and didn't know it  POSTURE:  rounded shoulders and forward head  Cervical ROM:  crepitus, but wfL  Active A/PROM (deg) eval  Flexion   Extension   Right lateral flexion   Left lateral flexion   Right rotation   Left rotation   (Blank rows = not tested)   LOWER EXTREMITY MMT:   MMT Right eval Left eval  Hip flexion 4 5  Hip abduction 4 4  Hip adduction 4+  4+  Hip internal rotation    Hip external rotation    Knee flexion 5 5  Knee extension 5 5  Ankle dorsiflexion 4 4  Ankle plantarflexion    Ankle inversion    Ankle eversion    (Blank rows = not tested)  TRANSFERS: Assistive device utilized: None  Sit to stand: Modified independence Stand to sit: Modified independence  GAIT: Gait pattern: step through pattern, ataxic, and wide BOS Distance walked: 50 ft Assistive device utilized: None Level of assistance: SBA Comments: Looks down at feet with gait  FUNCTIONAL TESTS:  5 times sit to stand: 18.94 sec with knee pain Timed up and go (TUG): 15.94 sec 10 meter walk test: TBA FGA:  TBA  M-CTSIB  Condition 1: Firm Surface, EO 30 Sec, Normal Sway  Condition 2: Firm Surface, EC 30 Sec, Mild Sway (*this is the  dizziness)  Condition 3: Foam Surface, EO 30 Sec, Mild Sway  Condition 4: Foam Surface, EC 27.9 Sec, Severe Sway      VESTIBULAR ASSESSMENT:  GENERAL OBSERVATION: cautious, guarded motion   SYMPTOM BEHAVIOR:  Subjective history: Feels like head is spinning, like I'm not firmly planted on the ground.  Don't feel stable standing.  Been going on for several years.  Non-Vestibular symptoms: tinnitus-long standing  Type of dizziness: Imbalance (Disequilibrium) and Unsteady with head/body turns  Frequency: daily, anytime I get up  Duration: while I'm up  Aggravating factors: Induced by motion: occur when walking, activity in general, and standing  Relieving factors: sitting  Progression of symptoms: unchanged  *DID NOT COMPLETE FURTHER VESTIBULAR COMPONENT OF ASSESSMENT, as pt reports more unsteadiness with standing/gait as primary symptom                                                                                                                            TREATMENT DATE: 08/12/2023   PATIENT EDUCATION: Education details: Eval results, POC Person educated: Patient Education method: Explanation Education comprehension: verbalized understanding  HOME EXERCISE PROGRAM:  GOALS: Goals reviewed with patient? Yes  SHORT TERM GOALS: Target date: 08/30/2023  Pt will be independent with HEP for improved balance, gait. Baseline: Goal status: IN PROGRESS  2.  Pt will improve 5x sit<>stand to less than or equal to 15 sec to demonstrate improved functional strength and transfer efficiency. Baseline: 18.94 sec Goal status: IN PROGRESS   LONG TERM GOALS: Target date: 09/27/2023  Pt will be independent with HEP for improved balance, gait. Baseline:  Goal status: IN PROGRESS  2.  Pt will improve TUG score to less than or equal to 13.5  sec for decreased fall risk. Baseline: 15.94 sec Goal status: IN PROGRESS  3.  Pt will improve FGA score to at least 23/30 to decrease fall  risk. Baseline: 16/30 08/19/23 Goal status: IN PROGRESS 08/19/23  4.  Pt will perform Condition 4 on MCTSIB 30 sec with moderate or less sway for improved balance. Baseline: 27.9 sec severe sway  Goal status: IN PROGRESS  5.  Pt will verbalize understanding of fall prevention in home environment. Baseline:  Goal status: IN PROGRESS  6.  Pt will verbalize plans for continued community fitness upon d/c from PT, to maximize gains in PT. Baseline: No current HEP Goal status: IN PROGRESS  ASSESSMENT:  CLINICAL IMPRESSION: Treatment session focused on establishing gaze stabilization HEP to improve vestibular integration with balance. Continued balance exercises to work on weight shifting and stability. Worked on gait using cane for stability. Pt able to demo good use of cane after v/cs.   OBJECTIVE IMPAIRMENTS: Abnormal gait, decreased balance, decreased knowledge of use of DME, decreased mobility, difficulty walking, and decreased strength.   ACTIVITY LIMITATIONS: bending, standing, squatting, transfers, and locomotion level  PARTICIPATION LIMITATIONS: shopping, community activity, and occupation  PERSONAL FACTORS: 3+ comorbidities: see above are also affecting patient's functional outcome.   REHAB POTENTIAL: Good  CLINICAL DECISION MAKING: Evolving/moderate complexity  EVALUATION COMPLEXITY: Moderate   PLAN:  PT FREQUENCY: 1-2x/week  PT DURATION: 6 weeks plus eval  PLANNED INTERVENTIONS: 97750- Physical Performance Testing, 97110-Therapeutic exercises, 97530- Therapeutic activity, [id number]- Neuromuscular re-education, 97535- Self Care, 02859- Manual therapy, [id number]- Gait training, [phone]- Aquatic Therapy, Patient/Family education, Balance training, and Vestibular training  PLAN FOR NEXT SESSION: review/progress HEP for balance.  Will need to complete any further vestibular testing (he doesn't seem to complain of spinning in the sense of vertigo, so I did not proceed with vestibular  portion of eval).     Jaquille Kau April Ma L Kaelen Brennan, [place], DPT 08/22/23 9:19 AM  [facility] Outpatient Rehab at [facility] [address] [place], [address] [place], [place] [address] Phone # [phone] Fax # [phone]

## 2023-08-23 DIAGNOSIS — S63642D Sprain of metacarpophalangeal joint of left thumb, subsequent encounter: Secondary | ICD-10-CM | POA: Diagnosis not present

## 2023-08-26 ENCOUNTER — Ambulatory Visit: Admitting: Physical Therapy

## 2023-08-26 ENCOUNTER — Encounter: Payer: Self-pay | Admitting: Physical Therapy

## 2023-08-26 DIAGNOSIS — M6281 Muscle weakness (generalized): Secondary | ICD-10-CM | POA: Diagnosis not present

## 2023-08-26 DIAGNOSIS — R2681 Unsteadiness on feet: Secondary | ICD-10-CM | POA: Diagnosis not present

## 2023-08-26 DIAGNOSIS — R262 Difficulty in walking, not elsewhere classified: Secondary | ICD-10-CM | POA: Diagnosis not present

## 2023-08-26 DIAGNOSIS — R2689 Other abnormalities of gait and mobility: Secondary | ICD-10-CM

## 2023-08-26 NOTE — Therapy (Signed)
 OUTPATIENT PHYSICAL THERAPY VESTIBULAR TREATMENT     Patient Name: Wesley [name] MRN: [id number] DOB:1945-07-16, 78 y.o., male Today's Date: 08/26/2023  END OF SESSION:  PT End of Session - 08/26/23 1008     Visit Number 4    Number of Visits 13    Date for PT Re-Evaluation 09/27/23    Authorization Type [employer]    Authorization Time Period approved 13 PT visits from 08/12/2023-09/27/2023    Authorization - Visit Number 4    Authorization - Number of Visits 13    Progress Note Due on Visit 10    PT Start Time 1016    PT Stop Time 1057    PT Time Calculation (min) 41 min    Equipment Utilized During Treatment Gait belt   will need gait belt d/t imbalance, hx of falls   Activity Tolerance Patient tolerated treatment well    Behavior During Therapy WFL for tasks assessed/performed            Past Medical History:  Diagnosis Date   Depression    Diverticulosis    Hyperlipidemia    Hypertension    Migraine    OSA on CPAP    mild obstructive sleep apnea with an AHI of 8 and O2 saturations as low as 85%.  On CPAP 10cm H2O   Past Surgical History:  Procedure Laterality Date   ATRIAL FIBRILLATION ABLATION N/A 10/17/2022   Procedure: ATRIAL FIBRILLATION ABLATION;  Surgeon: Kennyth Chew, MD;  Location: [facility] INVASIVE CV LAB;  Service: Cardiovascular;  Laterality: N/A;   CARDIOVERSION N/A 05/24/2021   Procedure: CARDIOVERSION;  Surgeon: Kate Lonni CROME, MD;  Location: [facility] ENDOSCOPY;  Service: Cardiovascular;  Laterality: N/A;   CARDIOVERSION N/A 07/31/2022   Procedure: CARDIOVERSION;  Surgeon: Sheena Pugh, DO;  Location: [facility] INVASIVE CV LAB;  Service: Cardiovascular;  Laterality: N/A;   CYSTOSCOPY WITH INSERTION OF UROLIFT N/A 04/28/2019   Procedure: CYSTOSCOPY WITH INSERTION OF UROLIFT;  Surgeon: Nieves Cough, MD;  Location: [facility]  Service: Urology;  Laterality: N/A;   GANGLION CYST EXCISION     PROSTATE BIOPSY N/A 04/28/2019    Procedure: BIOPSY TRANSRECTAL ULTRASONIC PROSTATE (TUBP);  Surgeon: Nieves Cough, MD;  Location: [facility];  Service: Urology;  Laterality: N/A;   Patient Active Problem List   Diagnosis Date Noted   Hypercoagulable state due to persistent atrial fibrillation (HCC) 08/23/2022   OSA on CPAP 01/03/2022   Persistent atrial fibrillation (HCC)    Diverticulitis of colon with perforation 12/18/2020   Bruxism 05/07/2017   Laryngopharyngeal reflux (LPR) 05/07/2017   Presbycusis of both ears 05/07/2017   Hemorrhoids, external, thrombosed 02/13/2012    PCP: Chrystal Lamarr RAMAN, MD  REFERRING PROVIDER: Chrystal Lamarr RAMAN, MD   REFERRING DIAG: R42 (ICD-10-CM) - Dizziness   THERAPY DIAG:  Unsteadiness on feet  Other abnormalities of gait and mobility  ONSET DATE: 08/06/2023 (MD referral)  Rationale for Evaluation and Treatment: Rehabilitation  SUBJECTIVE:   SUBJECTIVE STATEMENT: Had to go to [place]  for funeral and had to do a lot of driving in one day.  Have been approved for aquablation surgery for enlarged prostate and now am awaiting scheduling.   Pt accompanied by: self  PERTINENT HISTORY: reported memory issues, BPH, prostate cancer (possible upcoming surgery for the enlarged prostate-aquablation); atrial fibrillation, hypertension, hyperlipidemia, OSA, osteoarthritis, migraine headaches, history of kidney stones, diverticulosis, anxiety, depression, and overweight state, who reports problems with his balance and dizziness since he was diagnosed  with A-fib about a year or year and a half ago   PAIN:  Are you having pain? No  PRECAUTIONS: Fall and Other: wears L hand splint  RED FLAGS: None   WEIGHT BEARING RESTRICTIONS: No  FALLS: Has patient fallen in last 6 months? Yes. 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 time, church  PATIENT GOALS: To help with balance  OBJECTIVE:     TODAY'S TREATMENT: 08/26/2023 Activity Comments  Forward/back walking x 2 min 3/10 feeling of dizziness-unsteadiness  Tandem gait, tandem march Along counter-cues for looking ahead at visual target  Reviewed quarter turns Good return demo  180 turns in doorway More unsteady to L  Reviewed gaze stabilization Cues to speed up slightly, mild increase in symptoms  Reviewed smooth pursuit Slowed pace  Romberg, then feet apart with EC + head turns/head nods   Gait with SPC 50 ft x 8 reps Initial cues for cane sequence Gait with conversation-slows pace, and pt gets off sequence-able to get back to sequence easily   HOME EXERCISE PROGRAM Access Code: [email or web address] URL: [email or web address] Date: 08/26/2023 Prepared by: [facility] - Outpatient  Rehab - [facility]  Program Notes perform at counter for safety  Exercises - Tandem Walking with Counter Support  - 1 x daily - 5 x weekly - 2 sets - 5 reps - Backward Walking with Counter Support  - 1 x daily - 5 x weekly - 2 sets - 5 reps - Standing Gaze Stabilization with Head Nod  - 1 x daily - 7 x weekly - 2 sets - 30 sec hold - Standing Gaze Stabilization with Head Rotation  - 1 x daily - 7 x weekly - 2 sets - 30 sec hold - Standing Feet Together Smooth Pursuit  - 1 x daily - 7 x weekly - 2 sets - 30 sec hold - 180 Degree Pivot Turn with Single Point Cane  - 1-2 x daily - 7 x weekly - 1 sets - 5 reps - Romberg Stance with Eyes Closed  - 1-2 x daily - 7 x weekly - 2 sets - 5-10 reps         PATIENT EDUCATION: Education details: HEP updates/modifications, SPC use Person educated: Patient Education method: Explanation, Demonstration, Tactile cues, Verbal cues, and  Handouts Education comprehension: verbalized understanding and returned demonstration    Note: Objective measures were completed at Evaluation unless otherwise noted.  DIAGNOSTIC FINDINGS: MRI 06/26/2023 IMPRESSION: 1. No acute intracranial abnormality. 2. Mild-to-moderate chronic small vessel ischemic disease. 3. Moderate to severe cerebral atrophy.  COGNITION: Overall cognitive status: History of cognitive impairments - at baseline and reports memory/word finding changes   SENSATION: Light touch: WFL*does have question about sensation, as he lost a toenail and didn't know it  POSTURE:  rounded shoulders and forward head  Cervical ROM:  crepitus, but wfL  Active A/PROM (deg) eval  Flexion   Extension   Right lateral flexion   Left lateral flexion   Right rotation   Left rotation   (Blank rows = not tested)   LOWER EXTREMITY MMT:   MMT Right eval Left eval  Hip flexion 4 5  Hip abduction 4 4  Hip adduction 4+ 4+  Hip internal rotation  Hip external rotation    Knee flexion 5 5  Knee extension 5 5  Ankle dorsiflexion 4 4  Ankle plantarflexion    Ankle inversion    Ankle eversion    (Blank rows = not tested)  TRANSFERS: Assistive device utilized: None  Sit to stand: Modified independence Stand to sit: Modified independence  GAIT: Gait pattern: step through pattern, ataxic, and wide BOS Distance walked: 50 ft Assistive device utilized: None Level of assistance: SBA Comments: Looks down at feet with gait  FUNCTIONAL TESTS:  5 times sit to stand: 18.94 sec with knee pain Timed up and go (TUG): 15.94 sec 10 meter walk test: TBA FGA:  TBA  M-CTSIB  Condition 1: Firm Surface, EO 30 Sec, Normal Sway  Condition 2: Firm Surface, EC 30 Sec, Mild Sway (*this is the dizziness)  Condition 3: Foam Surface, EO 30 Sec, Mild Sway  Condition 4: Foam Surface, EC 27.9 Sec, Severe Sway      VESTIBULAR ASSESSMENT:  GENERAL OBSERVATION: cautious, guarded  motion   SYMPTOM BEHAVIOR:  Subjective history: Feels like head is spinning, like I'm not firmly planted on the ground.  Don't feel stable standing.  Been going on for several years.  Non-Vestibular symptoms: tinnitus-long standing  Type of dizziness: Imbalance (Disequilibrium) and Unsteady with head/body turns  Frequency: daily, anytime I get up  Duration: while I'm up  Aggravating factors: Induced by motion: occur when walking, activity in general, and standing  Relieving factors: sitting  Progression of symptoms: unchanged  *DID NOT COMPLETE FURTHER VESTIBULAR COMPONENT OF ASSESSMENT, as pt reports more unsteadiness with standing/gait as primary symptom                                                                                                                            TREATMENT DATE: 08/12/2023   PATIENT EDUCATION: Education details: Eval results, POC Person educated: Patient Education method: Explanation Education comprehension: verbalized understanding  HOME EXERCISE PROGRAM:  GOALS: Goals reviewed with patient? Yes  SHORT TERM GOALS: Target date: 08/30/2023  Pt will be independent with HEP for improved balance, gait. Baseline: Goal status: IN PROGRESS  2.  Pt will improve 5x sit<>stand to less than or equal to 15 sec to demonstrate improved functional strength and transfer efficiency. Baseline: 18.94 sec Goal status: IN PROGRESS   LONG TERM GOALS: Target date: 09/27/2023  Pt will be independent with HEP for improved balance, gait. Baseline:  Goal status: IN PROGRESS  2.  Pt will improve TUG score to less than or equal to 13.5  sec for decreased fall risk. Baseline: 15.94 sec Goal status: IN PROGRESS  3.  Pt will improve FGA score to at least 23/30 to decrease fall risk. Baseline: 16/30 08/19/23 Goal status: IN PROGRESS 08/19/23  4.  Pt will perform Condition 4 on MCTSIB 30 sec with moderate or less sway for improved balance. Baseline: 27.9 sec severe  sway Goal status: IN PROGRESS  5.  Pt  will verbalize understanding of fall prevention in home environment. Baseline:  Goal status: IN PROGRESS  6.  Pt will verbalize plans for continued community fitness upon d/c from PT, to maximize gains in PT. Baseline: No current HEP Goal status: IN PROGRESS  ASSESSMENT:  CLINICAL IMPRESSION: Pt presents today reporting fatigue after doing quite a bit of driving to and from a funeral over the weekend.  Skilled PT session focused on reviewing current HEP, with pt needing min cues for standing gaze stabilization and smooth pursuit activities.  Added to HEP to address 180 turns and multi-sensory balance.  Pt rates increased dizziness up to 3-4/10 throughout session, which he does reports settles between activities; he describes dizziness as unsteadiness.  Pt will continue to benefit from skilled PT towards goals for improved functional mobility and decreased fall risk.   OBJECTIVE IMPAIRMENTS: Abnormal gait, decreased balance, decreased knowledge of use of DME, decreased mobility, difficulty walking, and decreased strength.   ACTIVITY LIMITATIONS: bending, standing, squatting, transfers, and locomotion level  PARTICIPATION LIMITATIONS: shopping, community activity, and occupation  PERSONAL FACTORS: 3+ comorbidities: see above are also affecting patient's functional outcome.   REHAB POTENTIAL: Good  CLINICAL DECISION MAKING: Evolving/moderate complexity  EVALUATION COMPLEXITY: Moderate   PLAN:  PT FREQUENCY: 1-2x/week  PT DURATION: 6 weeks plus eval  PLANNED INTERVENTIONS: 97750- Physical Performance Testing, 97110-Therapeutic exercises, 97530- Therapeutic activity, [id number]- Neuromuscular re-education, 97535- Self Care, 02859- Manual therapy, [id number]- Gait training, [phone]- Aquatic Therapy, Patient/Family education, Balance training, and Vestibular training  PLAN FOR NEXT SESSION: Check STGs and discuss POC.  Pt is only scheduled through 8/29, but  reports having some upcoming medical appts, so not sure what he wants to do about PT.  Gait with cane-pt may bring his in to adjust.    Greig Anon, PT 08/26/23 10:58 AM Phone: [phone] Fax: [phone]  [facility] Outpatient Rehab at [facility] [address] [place], [address] [place], [place] [address] Phone # [phone] Fax # [phone]

## 2023-08-29 DIAGNOSIS — F332 Major depressive disorder, recurrent severe without psychotic features: Secondary | ICD-10-CM | POA: Diagnosis not present

## 2023-08-29 DIAGNOSIS — F411 Generalized anxiety disorder: Secondary | ICD-10-CM | POA: Diagnosis not present

## 2023-08-29 NOTE — Therapy (Signed)
 OUTPATIENT PHYSICAL THERAPY VESTIBULAR TREATMENT     Patient Name: Wesley [name] MRN: [id number] DOB:07/09/1945, 78 y.o., male Today's Date: 08/30/2023  END OF SESSION:  PT End of Session - 08/30/23 1141     Visit Number 5    Number of Visits 13    Date for PT Re-Evaluation 09/27/23    Authorization Type [employer]    Authorization Time Period approved 13 PT visits from 08/12/2023-09/27/2023    Authorization - Visit Number 5    Authorization - Number of Visits 13    Progress Note Due on Visit 10    PT Start Time 1104    PT Stop Time 1142    PT Time Calculation (min) 38 min    Equipment Utilized During Treatment Gait belt   will need gait belt d/t imbalance, hx of falls   Activity Tolerance Patient tolerated treatment well    Behavior During Therapy WFL for tasks assessed/performed             Past Medical History:  Diagnosis Date   Depression    Diverticulosis    Hyperlipidemia    Hypertension    Migraine    OSA on CPAP    mild obstructive sleep apnea with an AHI of 8 and O2 saturations as low as 85%.  On CPAP 10cm H2O   Past Surgical History:  Procedure Laterality Date   ATRIAL FIBRILLATION ABLATION N/A 10/17/2022   Procedure: ATRIAL FIBRILLATION ABLATION;  Surgeon: Kennyth Chew, MD;  Location: [facility] INVASIVE CV LAB;  Service: Cardiovascular;  Laterality: N/A;   CARDIOVERSION N/A 05/24/2021   Procedure: CARDIOVERSION;  Surgeon: Kate Lonni CROME, MD;  Location: [facility] ENDOSCOPY;  Service: Cardiovascular;  Laterality: N/A;   CARDIOVERSION N/A 07/31/2022   Procedure: CARDIOVERSION;  Surgeon: Sheena Pugh, DO;  Location: [facility] INVASIVE CV LAB;  Service: Cardiovascular;  Laterality: N/A;   CYSTOSCOPY WITH INSERTION OF UROLIFT N/A 04/28/2019   Procedure: CYSTOSCOPY WITH INSERTION OF UROLIFT;  Surgeon: Nieves Cough, MD;  Location: [facility];  Service: Urology;  Laterality: N/A;   GANGLION CYST EXCISION     PROSTATE BIOPSY N/A 04/28/2019    Procedure: BIOPSY TRANSRECTAL ULTRASONIC PROSTATE (TUBP);  Surgeon: Nieves Cough, MD;  Location: [facility];  Service: Urology;  Laterality: N/A;   Patient Active Problem List   Diagnosis Date Noted   Hypercoagulable state due to persistent atrial fibrillation (HCC) 08/23/2022   OSA on CPAP 01/03/2022   Persistent atrial fibrillation (HCC)    Diverticulitis of colon with perforation 12/18/2020   Bruxism 05/07/2017   Laryngopharyngeal reflux (LPR) 05/07/2017   Presbycusis of both ears 05/07/2017   Hemorrhoids, external, thrombosed 02/13/2012    PCP: Chrystal Lamarr RAMAN, MD  REFERRING PROVIDER: Chrystal Lamarr RAMAN, MD   REFERRING DIAG: R42 (ICD-10-CM) - Dizziness   THERAPY DIAG:  Unsteadiness on feet  Other abnormalities of gait and mobility  Difficulty in walking, not elsewhere classified  Muscle weakness (generalized)  ONSET DATE: 08/06/2023 (MD referral)  Rationale for Evaluation and Treatment: Rehabilitation  SUBJECTIVE:   SUBJECTIVE STATEMENT: Went [place] a nice place [place]. Reports that he almost fell leaving the restaurant.    Pt accompanied by: self  PERTINENT HISTORY: reported memory issues, BPH, prostate cancer (possible upcoming surgery for the enlarged prostate-aquablation); atrial fibrillation, hypertension, hyperlipidemia, OSA, osteoarthritis, migraine headaches, history of kidney stones, diverticulosis, anxiety, depression, and overweight state, who reports problems with his balance and dizziness since he was diagnosed with A-fib about a year or  year and a half ago   PAIN:  Are you having pain? No  PRECAUTIONS: Fall and Other: wears L hand splint  RED FLAGS: None   WEIGHT BEARING RESTRICTIONS: No  FALLS: Has patient fallen in last 6 months? Yes. Number of falls 1-stepped down off the patio and lost balance  LIVING ENVIRONMENT: Lives with: lives with their spouse Lives in: House/apartment Stairs: bilateral rails for steps to  enter home; bedroom on first level Has following equipment at home: None  PLOF: Independent, Vocation/Vocational requirements: retired Optician, dispensing, and Leisure: enjoys family time, church  PATIENT GOALS: To help with balance  OBJECTIVE:    TODAY'S TREATMENT: 08/30/23 Activity Comments  5xSTS 13.62 sec with first few reps pushing off LEs   HEP review at counter: - Tandem Walking with Counter Support   - Backward Walking with Counter Support   - Standing Gaze Stabilization with Head Nod  - Standing Gaze Stabilization with Head Rotation   - Standing Feet Together Smooth Pursuit   - 180 Degree Pivot Turn in doorway - Romberg Stance with Eyes Closed + head turns/nods Required occasional UE support   gait training with cane- SPC and quad tip cane Edu on sequencing, especially with turns which may need more practice. Pt appeared steady and reported improvement in balance with quad tip cane      PATIENT EDUCATION: Education details: discussed POC, STGs, discussed use of cane to prevent falls and provided handout on quad tip cane, HEP review and re-administered handout for max carryover Person educated: Patient Education method: Explanation, Demonstration, Tactile cues, Verbal cues, and Handouts Education comprehension: verbalized understanding and returned demonstration   Access Code: [email or web address] URL: [email or web address] Date: 08/26/2023 Prepared by: [facility] - Outpatient  Rehab - [facility]  Program Notes perform at counter for safety  Exercises - Tandem Walking with Counter Support  - 1 x daily - 5 x weekly - 2 sets - 5 reps - Backward Walking with Counter Support  - 1 x daily - 5 x weekly - 2 sets - 5 reps - Standing Gaze Stabilization with Head Nod  - 1 x daily - 7 x weekly - 2 sets - 30 sec hold - Standing Gaze Stabilization with Head Rotation  - 1 x daily - 7 x weekly - 2 sets - 30 sec hold - Standing Feet Together Smooth Pursuit  - 1 x daily - 7 x weekly -  2 sets - 30 sec hold - 180 Degree Pivot Turn with Single Point Cane  - 1-2 x daily - 7 x weekly - 1 sets - 5 reps - Romberg Stance with Eyes Closed  - 1-2 x daily - 7 x weekly - 2 sets - 5-10 reps    Note: Objective measures were completed at Evaluation unless otherwise noted.  DIAGNOSTIC FINDINGS: MRI 06/26/2023 IMPRESSION: 1. No acute intracranial abnormality. 2. Mild-to-moderate chronic small vessel ischemic disease. 3. Moderate to severe cerebral atrophy.  COGNITION: Overall cognitive status: History of cognitive impairments - at baseline and reports memory/word finding changes   SENSATION: Light touch: WFL*does have question about sensation, as he lost a toenail and didn't know it  POSTURE:  rounded shoulders and forward head  Cervical ROM:  crepitus, but wfL  Active A/PROM (deg) eval  Flexion   Extension   Right lateral flexion   Left lateral flexion   Right rotation   Left rotation   (Blank rows = not tested)   LOWER EXTREMITY MMT:   MMT Right eval  Left eval  Hip flexion 4 5  Hip abduction 4 4  Hip adduction 4+ 4+  Hip internal rotation    Hip external rotation    Knee flexion 5 5  Knee extension 5 5  Ankle dorsiflexion 4 4  Ankle plantarflexion    Ankle inversion    Ankle eversion    (Blank rows = not tested)  TRANSFERS: Assistive device utilized: None  Sit to stand: Modified independence Stand to sit: Modified independence  GAIT: Gait pattern: step through pattern, ataxic, and wide BOS Distance walked: 50 ft Assistive device utilized: None Level of assistance: SBA Comments: Looks down at feet with gait  FUNCTIONAL TESTS:  5 times sit to stand: 18.94 sec with knee pain Timed up and go (TUG): 15.94 sec 10 meter walk test: TBA FGA:  TBA  M-CTSIB  Condition 1: Firm Surface, EO 30 Sec, Normal Sway  Condition 2: Firm Surface, EC 30 Sec, Mild Sway (*this is the dizziness)  Condition 3: Foam Surface, EO 30 Sec, Mild Sway  Condition 4: Foam  Surface, EC 27.9 Sec, Severe Sway      VESTIBULAR ASSESSMENT:  GENERAL OBSERVATION: cautious, guarded motion   SYMPTOM BEHAVIOR:  Subjective history: Feels like head is spinning, like I'm not firmly planted on the ground.  Don't feel stable standing.  Been going on for several years.  Non-Vestibular symptoms: tinnitus-long standing  Type of dizziness: Imbalance (Disequilibrium) and Unsteady with head/body turns  Frequency: daily, anytime I get up  Duration: while I'm up  Aggravating factors: Induced by motion: occur when walking, activity in general, and standing  Relieving factors: sitting  Progression of symptoms: unchanged  *DID NOT COMPLETE FURTHER VESTIBULAR COMPONENT OF ASSESSMENT, as pt reports more unsteadiness with standing/gait as primary symptom                                                                                                                            TREATMENT DATE: 08/12/2023   PATIENT EDUCATION: Education details: Eval results, POC Person educated: Patient Education method: Explanation Education comprehension: verbalized understanding  HOME EXERCISE PROGRAM:  GOALS: Goals reviewed with patient? Yes  SHORT TERM GOALS: Target date: 08/30/2023  Pt will be independent with HEP for improved balance, gait. Baseline: Goal status:  MET 08/30/23  2.  Pt will improve 5x sit<>stand to less than or equal to 15 sec to demonstrate improved functional strength and transfer efficiency. Baseline: 18.94 sec; 13.62 sec with first few reps pushing off LEs 08/30/23 Goal status: MET 08/30/23   LONG TERM GOALS: Target date: 09/27/2023  Pt will be independent with HEP for improved balance, gait. Baseline:  Goal status: IN PROGRESS  2.  Pt will improve TUG score to less than or equal to 13.5  sec for decreased fall risk. Baseline: 15.94 sec; 13.62 sec with first few reps pushing off LEs 08/30/23 Goal status: IN PROGRESS 08/30/23   3.  Pt will improve FGA score to  at  least 23/30 to decrease fall risk. Baseline: 16/30 08/19/23 Goal status: IN PROGRESS 08/19/23  4.  Pt will perform Condition 4 on MCTSIB 30 sec with moderate or less sway for improved balance. Baseline: 27.9 sec severe sway Goal status: IN PROGRESS  5.  Pt will verbalize understanding of fall prevention in home environment. Baseline:  Goal status: IN PROGRESS  6.  Pt will verbalize plans for continued community fitness upon d/c from PT, to maximize gains in PT. Baseline: No current HEP Goal status: IN PROGRESS  ASSESSMENT:  CLINICAL IMPRESSION: Patient arrived to session with report of near-fall since last session. Patient has met 5xSTS short term goal today. Reviewed HEP to ensure max benefit and safety. Worked on gait training with different canes to find most beneficial device. Provided handout on obtaining quad tip or tricane as pt reported most benefit from this today. No complaints at end of session.   OBJECTIVE IMPAIRMENTS: Abnormal gait, decreased balance, decreased knowledge of use of DME, decreased mobility, difficulty walking, and decreased strength.   ACTIVITY LIMITATIONS: bending, standing, squatting, transfers, and locomotion level  PARTICIPATION LIMITATIONS: shopping, community activity, and occupation  PERSONAL FACTORS: 3+ comorbidities: see above are also affecting patient's functional outcome.   REHAB POTENTIAL: Good  CLINICAL DECISION MAKING: Evolving/moderate complexity  EVALUATION COMPLEXITY: Moderate   PLAN:  PT FREQUENCY: 1-2x/week  PT DURATION: 6 weeks plus eval  PLANNED INTERVENTIONS: 97750- Physical Performance Testing, 97110-Therapeutic exercises, 97530- Therapeutic activity, 97112- Neuromuscular re-education, 97535- Self Care, 02859- Manual therapy, [phone]- Gait training, [phone]- Aquatic Therapy, Patient/Family education, Balance training, and Vestibular training  PLAN FOR NEXT SESSION: Gait with cane-pt may bring his in to adjust.,  turns     Louana Terrilyn Christians, PT, DPT 08/30/23 11:45 AM  [facility] [address] [place], [place] [address] Phone # [phone] Fax # [phone]

## 2023-08-30 ENCOUNTER — Encounter: Payer: Self-pay | Admitting: Physical Therapy

## 2023-08-30 ENCOUNTER — Ambulatory Visit: Admitting: Physical Therapy

## 2023-08-30 DIAGNOSIS — M6281 Muscle weakness (generalized): Secondary | ICD-10-CM | POA: Diagnosis not present

## 2023-08-30 DIAGNOSIS — R2681 Unsteadiness on feet: Secondary | ICD-10-CM | POA: Diagnosis not present

## 2023-08-30 DIAGNOSIS — S63642D Sprain of metacarpophalangeal joint of left thumb, subsequent encounter: Secondary | ICD-10-CM | POA: Diagnosis not present

## 2023-08-30 DIAGNOSIS — R262 Difficulty in walking, not elsewhere classified: Secondary | ICD-10-CM | POA: Diagnosis not present

## 2023-08-30 DIAGNOSIS — R2689 Other abnormalities of gait and mobility: Secondary | ICD-10-CM

## 2023-09-05 ENCOUNTER — Ambulatory Visit: Attending: Family Medicine | Admitting: Physical Therapy

## 2023-09-05 DIAGNOSIS — M6281 Muscle weakness (generalized): Secondary | ICD-10-CM | POA: Diagnosis not present

## 2023-09-05 DIAGNOSIS — R262 Difficulty in walking, not elsewhere classified: Secondary | ICD-10-CM | POA: Diagnosis not present

## 2023-09-05 DIAGNOSIS — R2681 Unsteadiness on feet: Secondary | ICD-10-CM | POA: Diagnosis not present

## 2023-09-05 DIAGNOSIS — R2689 Other abnormalities of gait and mobility: Secondary | ICD-10-CM | POA: Diagnosis not present

## 2023-09-05 NOTE — Therapy (Signed)
 OUTPATIENT PHYSICAL THERAPY VESTIBULAR TREATMENT     Patient Name: Wesley [name] MRN: [id number] DOB:Aug 01, 1945, 78 y.o., male Today's Date: 09/05/2023  END OF SESSION:  PT End of Session - 09/05/23 1315     Visit Number 6    Number of Visits 13    Date for PT Re-Evaluation 09/27/23    Authorization Type [employer]    Authorization Time Period approved 13 PT visits from 08/12/2023-09/27/2023    Authorization - Visit Number 6    Authorization - Number of Visits 13    Progress Note Due on Visit 10    PT Start Time 1315    PT Stop Time 1355    PT Time Calculation (min) 40 min    Equipment Utilized During Treatment Gait belt   will need gait belt d/t imbalance, hx of falls   Activity Tolerance Patient tolerated treatment well    Behavior During Therapy WFL for tasks assessed/performed           Past Medical History:  Diagnosis Date   Depression    Diverticulosis    Hyperlipidemia    Hypertension    Migraine    OSA on CPAP    mild obstructive sleep apnea with an AHI of 8 and O2 saturations as low as 85%.  On CPAP 10cm H2O   Past Surgical History:  Procedure Laterality Date   ATRIAL FIBRILLATION ABLATION N/A 10/17/2022   Procedure: ATRIAL FIBRILLATION ABLATION;  Surgeon: Kennyth Chew, MD;  Location: [facility] INVASIVE CV LAB;  Service: Cardiovascular;  Laterality: N/A;   CARDIOVERSION N/A 05/24/2021   Procedure: CARDIOVERSION;  Surgeon: Kate Lonni CROME, MD;  Location: [facility] ENDOSCOPY;  Service: Cardiovascular;  Laterality: N/A;   CARDIOVERSION N/A 07/31/2022   Procedure: CARDIOVERSION;  Surgeon: Sheena Pugh, DO;  Location: [facility] INVASIVE CV LAB;  Service: Cardiovascular;  Laterality: N/A;   CYSTOSCOPY WITH INSERTION OF UROLIFT N/A 04/28/2019   Procedure: CYSTOSCOPY WITH INSERTION OF UROLIFT;  Surgeon: Nieves Cough, MD;  Location: [facility];  Service: Urology;  Laterality: N/A;   GANGLION CYST EXCISION     PROSTATE BIOPSY N/A 04/28/2019    Procedure: BIOPSY TRANSRECTAL ULTRASONIC PROSTATE (TUBP);  Surgeon: Nieves Cough, MD;  Location: [facility];  Service: Urology;  Laterality: N/A;   Patient Active Problem List   Diagnosis Date Noted   Hypercoagulable state due to persistent atrial fibrillation (HCC) 08/23/2022   OSA on CPAP 01/03/2022   Persistent atrial fibrillation (HCC)    Diverticulitis of colon with perforation 12/18/2020   Bruxism 05/07/2017   Laryngopharyngeal reflux (LPR) 05/07/2017   Presbycusis of both ears 05/07/2017   Hemorrhoids, external, thrombosed 02/13/2012    PCP: Chrystal Lamarr RAMAN, MD  REFERRING PROVIDER: Chrystal Lamarr RAMAN, MD   REFERRING DIAG: R42 (ICD-10-CM) - Dizziness   THERAPY DIAG:  No diagnosis found.  ONSET DATE: 08/06/2023 (MD referral)  Rationale for Evaluation and Treatment: Rehabilitation  SUBJECTIVE:   SUBJECTIVE STATEMENT: Pt states he got approval to perform procedure for aquablation between October and January. No near falls. Pt comes in with SPC.    Pt accompanied by: self  PERTINENT HISTORY: reported memory issues, BPH, prostate cancer (possible upcoming surgery for the enlarged prostate-aquablation); atrial fibrillation, hypertension, hyperlipidemia, OSA, osteoarthritis, migraine headaches, history of kidney stones, diverticulosis, anxiety, depression, and overweight state, who reports problems with his balance and dizziness since he was diagnosed with A-fib about a year or year and a half ago   PAIN:  Are you having  pain? No  PRECAUTIONS: Fall and Other: wears L hand splint  RED FLAGS: None   WEIGHT BEARING RESTRICTIONS: No  FALLS: Has patient fallen in last 6 months? Yes. Number of falls 1-stepped down off the patio and lost balance  LIVING ENVIRONMENT: Lives with: lives with their spouse Lives in: House/apartment Stairs: bilateral rails for steps to enter home; bedroom on first level Has following equipment at home: None  PLOF:  Independent, Vocation/Vocational requirements: retired Optician, dispensing, and Leisure: enjoys family time, church  PATIENT GOALS: To help with balance  OBJECTIVE:  TODAY'S TREATMENT: 09/05/23 Activity Comments  Nustep L5 x 6 min UEs/LEs   At counter for safety with cane on R: - Staggered stance rocking fwd/bwd 2x10 - Tandem stance VOR x1 vertical and horizontal 2x30 - Tandem walking fwd/bwd with cane x5 laps by counter - Stabilizing on one leg, tennis ball roll fwd/bwd under other foot 2x30 - Feet apart on airex VOR x1 vertical and horizontal x30, feet together x30 - Feet apart and then together on airex vestibular ball x30 CW & CCW - Feet apart and then together on airex trunk rotations x30     gait training with cane- SPC  Improved sequencing      PATIENT EDUCATION: Education details: HEP handout for max carryover Person educated: Patient Education method: Programmer, multimedia, Demonstration, Tactile cues, Verbal cues, and Handouts Education comprehension: verbalized understanding and returned demonstration   Access Code: [email or web address] URL: [email or web address] Date: 08/26/2023 Prepared by: [facility] - Outpatient  Rehab - [facility]  Program Notes perform at counter for safety  Exercises - Tandem Walking with Counter Support  - 1 x daily - 5 x weekly - 2 sets - 5 reps - Backward Walking with Counter Support  - 1 x daily - 5 x weekly - 2 sets - 5 reps - Standing Gaze Stabilization with Head Nod  - 1 x daily - 7 x weekly - 2 sets - 30 sec hold - Standing Gaze Stabilization with Head Rotation  - 1 x daily - 7 x weekly - 2 sets - 30 sec hold - Standing Feet Together Smooth Pursuit  - 1 x daily - 7 x weekly - 2 sets - 30 sec hold - 180 Degree Pivot Turn with Single Point Cane  - 1-2 x daily - 7 x weekly - 1 sets - 5 reps - Romberg Stance with Eyes Closed  - 1-2 x daily - 7 x weekly - 2 sets - 5-10 reps    Note: Objective measures were completed at Evaluation unless  otherwise noted.  DIAGNOSTIC FINDINGS: MRI 06/26/2023 IMPRESSION: 1. No acute intracranial abnormality. 2. Mild-to-moderate chronic small vessel ischemic disease. 3. Moderate to severe cerebral atrophy.  COGNITION: Overall cognitive status: History of cognitive impairments - at baseline and reports memory/word finding changes   SENSATION: Light touch: WFL*does have question about sensation, as he lost a toenail and didn't know it  POSTURE:  rounded shoulders and forward head  Cervical ROM:  crepitus, but wfL  Active A/PROM (deg) eval  Flexion   Extension   Right lateral flexion   Left lateral flexion   Right rotation   Left rotation   (Blank rows = not tested)   LOWER EXTREMITY MMT:   MMT Right eval Left eval  Hip flexion 4 5  Hip abduction 4 4  Hip adduction 4+ 4+  Hip internal rotation    Hip external rotation    Knee flexion 5 5  Knee extension 5 5  Ankle dorsiflexion 4 4  Ankle plantarflexion    Ankle inversion    Ankle eversion    (Blank rows = not tested)  TRANSFERS: Assistive device utilized: None  Sit to stand: Modified independence Stand to sit: Modified independence  GAIT: Gait pattern: step through pattern, ataxic, and wide BOS Distance walked: 50 ft Assistive device utilized: None Level of assistance: SBA Comments: Looks down at feet with gait  FUNCTIONAL TESTS:  5 times sit to stand: 18.94 sec with knee pain Timed up and go (TUG): 15.94 sec 10 meter walk test: TBA FGA:  TBA  M-CTSIB  Condition 1: Firm Surface, EO 30 Sec, Normal Sway  Condition 2: Firm Surface, EC 30 Sec, Mild Sway (*this is the dizziness)  Condition 3: Foam Surface, EO 30 Sec, Mild Sway  Condition 4: Foam Surface, EC 27.9 Sec, Severe Sway      VESTIBULAR ASSESSMENT:  GENERAL OBSERVATION: cautious, guarded motion   SYMPTOM BEHAVIOR:  Subjective history: Feels like head is spinning, like I'm not firmly planted on the ground.  Don't feel stable standing.  Been  going on for several years.  Non-Vestibular symptoms: tinnitus-long standing  Type of dizziness: Imbalance (Disequilibrium) and Unsteady with head/body turns  Frequency: daily, anytime I get up  Duration: while I'm up  Aggravating factors: Induced by motion: occur when walking, activity in general, and standing  Relieving factors: sitting  Progression of symptoms: unchanged  *DID NOT COMPLETE FURTHER VESTIBULAR COMPONENT OF ASSESSMENT, as pt reports more unsteadiness with standing/gait as primary symptom                                                                                                                            TREATMENT DATE: 08/12/2023   PATIENT EDUCATION: Education details: Eval results, POC Person educated: Patient Education method: Explanation Education comprehension: verbalized understanding  HOME EXERCISE PROGRAM:  GOALS: Goals reviewed with patient? Yes  SHORT TERM GOALS: Target date: 08/30/2023  Pt will be independent with HEP for improved balance, gait. Baseline: Goal status:  MET 08/30/23  2.  Pt will improve 5x sit<>stand to less than or equal to 15 sec to demonstrate improved functional strength and transfer efficiency. Baseline: 18.94 sec; 13.62 sec with first few reps pushing off LEs 08/30/23 Goal status: MET 08/30/23   LONG TERM GOALS: Target date: 09/27/2023  Pt will be independent with HEP for improved balance, gait. Baseline:  Goal status: IN PROGRESS  2.  Pt will improve TUG score to less than or equal to 13.5  sec for decreased fall risk. Baseline: 15.94 sec; 13.62 sec with first few reps pushing off LEs 08/30/23 Goal status: IN PROGRESS 08/30/23   3.  Pt will improve FGA score to at least 23/30 to decrease fall risk. Baseline: 16/30 08/19/23 Goal status: IN PROGRESS 08/19/23  4.  Pt will perform Condition 4 on MCTSIB 30 sec with moderate or less sway for improved balance. Baseline: 27.9 sec severe sway Goal status:  IN PROGRESS  5.  Pt  will verbalize understanding of fall prevention in home environment. Baseline:  Goal status: IN PROGRESS  6.  Pt will verbalize plans for continued community fitness upon d/c from PT, to maximize gains in PT. Baseline: No current HEP Goal status: IN PROGRESS  ASSESSMENT:  CLINICAL IMPRESSION: Improving stability with narrower BOS in static standing -- gets LOB with trunk mobility and narrow base. Will continue to progress pt's balance as able and improve dynamic gait/safety with cane.   OBJECTIVE IMPAIRMENTS: Abnormal gait, decreased balance, decreased knowledge of use of DME, decreased mobility, difficulty walking, and decreased strength.   ACTIVITY LIMITATIONS: bending, standing, squatting, transfers, and locomotion level  PARTICIPATION LIMITATIONS: shopping, community activity, and occupation  PERSONAL FACTORS: 3+ comorbidities: see above are also affecting patient's functional outcome.   REHAB POTENTIAL: Good  CLINICAL DECISION MAKING: Evolving/moderate complexity  EVALUATION COMPLEXITY: Moderate   PLAN:  PT FREQUENCY: 1-2x/week  PT DURATION: 6 weeks plus eval  PLANNED INTERVENTIONS: 97750- Physical Performance Testing, 97110-Therapeutic exercises, 97530- Therapeutic activity, 97112- Neuromuscular re-education, 97535- Self Care, 02859- Manual therapy, [phone]- Gait training, [phone]- Aquatic Therapy, Patient/Family education, Balance training, and Vestibular training  PLAN FOR NEXT SESSION: Gait with cane-pt may bring his in to adjust., turns     Dayrin Stallone April Ma L Dahlgren, PT, DPT 09/05/23 1:15 PM  [facility] at [facility] [address] [place], [place] [address] Phone # [phone] Fax # [phone]

## 2023-09-11 ENCOUNTER — Other Ambulatory Visit (HOSPITAL_COMMUNITY): Payer: Self-pay

## 2023-09-11 DIAGNOSIS — Z7901 Long term (current) use of anticoagulants: Secondary | ICD-10-CM | POA: Diagnosis not present

## 2023-09-11 DIAGNOSIS — S0181XA Laceration without foreign body of other part of head, initial encounter: Secondary | ICD-10-CM | POA: Diagnosis not present

## 2023-09-11 DIAGNOSIS — U071 COVID-19: Secondary | ICD-10-CM | POA: Diagnosis not present

## 2023-09-11 DIAGNOSIS — W19XXXA Unspecified fall, initial encounter: Secondary | ICD-10-CM | POA: Diagnosis not present

## 2023-09-11 DIAGNOSIS — J069 Acute upper respiratory infection, unspecified: Secondary | ICD-10-CM | POA: Diagnosis not present

## 2023-09-11 MED ORDER — MOLNUPIRAVIR 200 MG PO CAPS
4.0000 | ORAL_CAPSULE | Freq: Two times a day (BID) | ORAL | 0 refills | Status: DC
  Filled 2023-09-11: qty 40, 5d supply, fill #0

## 2023-09-11 NOTE — Therapy (Incomplete)
 OUTPATIENT PHYSICAL THERAPY VESTIBULAR TREATMENT     Patient Name: Wesley [name] MRN: [id number] DOB:Sep 18, 1945, 78 y.o., male Today's Date: 09/11/2023  END OF SESSION:     Past Medical History:  Diagnosis Date   Depression    Diverticulosis    Hyperlipidemia    Hypertension    Migraine    OSA on CPAP    mild obstructive sleep apnea with an AHI of 8 and O2 saturations as low as 85%.  On CPAP 10cm H2O   Past Surgical History:  Procedure Laterality Date   ATRIAL FIBRILLATION ABLATION N/A 10/17/2022   Procedure: ATRIAL FIBRILLATION ABLATION;  Surgeon: Kennyth Chew, MD;  Location: [facility] INVASIVE CV LAB;  Service: Cardiovascular;  Laterality: N/A;   CARDIOVERSION N/A 05/24/2021   Procedure: CARDIOVERSION;  Surgeon: Kate Lonni CROME, MD;  Location: [facility] ENDOSCOPY;  Service: Cardiovascular;  Laterality: N/A;   CARDIOVERSION N/A 07/31/2022   Procedure: CARDIOVERSION;  Surgeon: Sheena Pugh, DO;  Location: [facility] INVASIVE CV LAB;  Service: Cardiovascular;  Laterality: N/A;   CYSTOSCOPY WITH INSERTION OF UROLIFT N/A 04/28/2019   Procedure: CYSTOSCOPY WITH INSERTION OF UROLIFT;  Surgeon: Nieves Cough, MD;  Location: [facility];  Service: Urology;  Laterality: N/A;   GANGLION CYST EXCISION     PROSTATE BIOPSY N/A 04/28/2019   Procedure: BIOPSY TRANSRECTAL ULTRASONIC PROSTATE (TUBP);  Surgeon: Nieves Cough, MD;  Location: [facility];  Service: Urology;  Laterality: N/A;   Patient Active Problem List   Diagnosis Date Noted   Hypercoagulable state due to persistent atrial fibrillation (HCC) 08/23/2022   OSA on CPAP 01/03/2022   Persistent atrial fibrillation (HCC)    Diverticulitis of colon with perforation 12/18/2020   Bruxism 05/07/2017   Laryngopharyngeal reflux (LPR) 05/07/2017   Presbycusis of both ears 05/07/2017   Hemorrhoids, external, thrombosed 02/13/2012    PCP: Chrystal Lamarr RAMAN, MD  REFERRING PROVIDER: Chrystal Lamarr RAMAN, MD   REFERRING DIAG: R42 (ICD-10-CM) - Dizziness   THERAPY DIAG:  No diagnosis found.  ONSET DATE: 08/06/2023 (MD referral)  Rationale for Evaluation and Treatment: Rehabilitation  SUBJECTIVE:   SUBJECTIVE STATEMENT: Pt states he got approval to perform procedure for aquablation between October and January. No near falls. Pt comes in with SPC.    Pt accompanied by: self  PERTINENT HISTORY: reported memory issues, BPH, prostate cancer (possible upcoming surgery for the enlarged prostate-aquablation); atrial fibrillation, hypertension, hyperlipidemia, OSA, osteoarthritis, migraine headaches, history of kidney stones, diverticulosis, anxiety, depression, and overweight state, who reports problems with his balance and dizziness since he was diagnosed with A-fib about a year or year and a half ago   PAIN:  Are you having pain? No  PRECAUTIONS: Fall and Other: wears L hand splint  RED FLAGS: None   WEIGHT BEARING RESTRICTIONS: No  FALLS: Has patient fallen in last 6 months? Yes. Number of falls 1-stepped down off the patio and lost balance  LIVING ENVIRONMENT: Lives with: lives with their spouse Lives in: House/apartment Stairs: bilateral rails for steps to enter home; bedroom on first level Has following equipment at home: None  PLOF: Independent, Vocation/Vocational requirements: retired Optician, dispensing, and Leisure: enjoys family time, church  PATIENT GOALS: To help with balance  OBJECTIVE:     TODAY'S TREATMENT: 09/12/23 Activity Comments                        TODAY'S TREATMENT: 09/05/23 Activity Comments  Nustep L5 x 6 min UEs/LEs   At counter  for safety with cane on R: - Staggered stance rocking fwd/bwd 2x10 - Tandem stance VOR x1 vertical and horizontal 2x30 - Tandem walking fwd/bwd with cane x5 laps by counter - Stabilizing on one leg, tennis ball roll fwd/bwd under other foot 2x30 - Feet apart on airex VOR x1 vertical and horizontal x30, feet together  x30 - Feet apart and then together on airex vestibular ball x30 CW & CCW - Feet apart and then together on airex trunk rotations x30     gait training with cane- SPC  Improved sequencing      PATIENT EDUCATION: Education details: HEP handout for max carryover Person educated: Patient Education method: Explanation, Demonstration, Tactile cues, Verbal cues, and Handouts Education comprehension: verbalized understanding and returned demonstration   Access Code: [email or web address] URL: [email or web address] Date: 08/26/2023 Prepared by: [facility] - [facility]  Program Notes perform at counter for safety  Exercises - Tandem Walking with Counter Support  - 1 x daily - 5 x weekly - 2 sets - 5 reps - Backward Walking with Counter Support  - 1 x daily - 5 x weekly - 2 sets - 5 reps - Standing Gaze Stabilization with Head Nod  - 1 x daily - 7 x weekly - 2 sets - 30 sec hold - Standing Gaze Stabilization with Head Rotation  - 1 x daily - 7 x weekly - 2 sets - 30 sec hold - Standing Feet Together Smooth Pursuit  - 1 x daily - 7 x weekly - 2 sets - 30 sec hold - 180 Degree Pivot Turn with Single Point Cane  - 1-2 x daily - 7 x weekly - 1 sets - 5 reps - Romberg Stance with Eyes Closed  - 1-2 x daily - 7 x weekly - 2 sets - 5-10 reps    Note: Objective measures were completed at Evaluation unless otherwise noted.  DIAGNOSTIC FINDINGS: MRI 06/26/2023 IMPRESSION: 1. No acute intracranial abnormality. 2. Mild-to-moderate chronic small vessel ischemic disease. 3. Moderate to severe cerebral atrophy.  COGNITION: Overall cognitive status: History of cognitive impairments - at baseline and reports memory/word finding changes   SENSATION: Light touch: WFL*does have question about sensation, as he lost a toenail and didn't know it  POSTURE:  rounded shoulders and forward head  Cervical ROM:  crepitus, but wfL  Active A/PROM (deg) eval  Flexion    Extension   Right lateral flexion   Left lateral flexion   Right rotation   Left rotation   (Blank rows = not tested)   LOWER EXTREMITY MMT:   MMT Right eval Left eval  Hip flexion 4 5  Hip abduction 4 4  Hip adduction 4+ 4+  Hip internal rotation    Hip external rotation    Knee flexion 5 5  Knee extension 5 5  Ankle dorsiflexion 4 4  Ankle plantarflexion    Ankle inversion    Ankle eversion    (Blank rows = not tested)  TRANSFERS: Assistive device utilized: None  Sit to stand: Modified independence Stand to sit: Modified independence  GAIT: Gait pattern: step through pattern, ataxic, and wide BOS Distance walked: 50 ft Assistive device utilized: None Level of assistance: SBA Comments: Looks down at feet with gait  FUNCTIONAL TESTS:  5 times sit to stand: 18.94 sec with knee pain Timed up and go (TUG): 15.94 sec 10 meter walk test: TBA FGA:  TBA  M-CTSIB  Condition 1: Firm Surface, EO 30 Sec,  Normal Sway  Condition 2: Firm Surface, EC 30 Sec, Mild Sway (*this is the dizziness)  Condition 3: Foam Surface, EO 30 Sec, Mild Sway  Condition 4: Foam Surface, EC 27.9 Sec, Severe Sway      VESTIBULAR ASSESSMENT:  GENERAL OBSERVATION: cautious, guarded motion   SYMPTOM BEHAVIOR:  Subjective history: Feels like head is spinning, like I'm not firmly planted on the ground.  Don't feel stable standing.  Been going on for several years.  Non-Vestibular symptoms: tinnitus-long standing  Type of dizziness: Imbalance (Disequilibrium) and Unsteady with head/body turns  Frequency: daily, anytime I get up  Duration: while I'm up  Aggravating factors: Induced by motion: occur when walking, activity in general, and standing  Relieving factors: sitting  Progression of symptoms: unchanged  *DID NOT COMPLETE FURTHER VESTIBULAR COMPONENT OF ASSESSMENT, as pt reports more unsteadiness with standing/gait as primary symptom                                                                                                                             TREATMENT DATE: 08/12/2023   PATIENT EDUCATION: Education details: Eval results, POC Person educated: Patient Education method: Explanation Education comprehension: verbalized understanding  HOME EXERCISE PROGRAM:  GOALS: Goals reviewed with patient? Yes  SHORT TERM GOALS: Target date: 08/30/2023  Pt will be independent with HEP for improved balance, gait. Baseline: Goal status:  MET 08/30/23  2.  Pt will improve 5x sit<>stand to less than or equal to 15 sec to demonstrate improved functional strength and transfer efficiency. Baseline: 18.94 sec; 13.62 sec with first few reps pushing off LEs 08/30/23 Goal status: MET 08/30/23   LONG TERM GOALS: Target date: 09/27/2023  Pt will be independent with HEP for improved balance, gait. Baseline:  Goal status: IN PROGRESS  2.  Pt will improve TUG score to less than or equal to 13.5  sec for decreased fall risk. Baseline: 15.94 sec; 13.62 sec with first few reps pushing off LEs 08/30/23 Goal status: IN PROGRESS 08/30/23   3.  Pt will improve FGA score to at least 23/30 to decrease fall risk. Baseline: 16/30 08/19/23 Goal status: IN PROGRESS 08/19/23  4.  Pt will perform Condition 4 on MCTSIB 30 sec with moderate or less sway for improved balance. Baseline: 27.9 sec severe sway Goal status: IN PROGRESS  5.  Pt will verbalize understanding of fall prevention in home environment. Baseline:  Goal status: IN PROGRESS  6.  Pt will verbalize plans for continued community fitness upon d/c from PT, to maximize gains in PT. Baseline: No current HEP Goal status: IN PROGRESS  ASSESSMENT:  CLINICAL IMPRESSION: Improving stability with narrower BOS in static standing -- gets LOB with trunk mobility and narrow base. Will continue to progress pt's balance as able and improve dynamic gait/safety with cane.   OBJECTIVE IMPAIRMENTS: Abnormal gait, decreased balance, decreased  knowledge of use of DME, decreased mobility, difficulty walking, and decreased strength.   ACTIVITY LIMITATIONS:  bending, standing, squatting, transfers, and locomotion level  PARTICIPATION LIMITATIONS: shopping, community activity, and occupation  PERSONAL FACTORS: 3+ comorbidities: see above are also affecting patient's functional outcome.   REHAB POTENTIAL: Good  CLINICAL DECISION MAKING: Evolving/moderate complexity  EVALUATION COMPLEXITY: Moderate   PLAN:  PT FREQUENCY: 1-2x/week  PT DURATION: 6 weeks plus eval  PLANNED INTERVENTIONS: 97750- Physical Performance Testing, 97110-Therapeutic exercises, 97530- Therapeutic activity, 97112- Neuromuscular re-education, 97535- Self Care, [id number]- Manual therapy, [phone]- Gait training, [phone]- Aquatic Therapy, Patient/Family education, Balance training, and Vestibular training  PLAN FOR NEXT SESSION: Gait with cane-pt may bring his in to adjust., turns     Slater MARLA Christians, PT, DPT 09/11/23 11:52 AM  [facility] [address] [place], [place] [address] Phone # [phone] Fax # [phone]

## 2023-09-12 ENCOUNTER — Ambulatory Visit: Admitting: Physical Therapy

## 2023-09-13 ENCOUNTER — Other Ambulatory Visit: Payer: Self-pay | Admitting: Urology

## 2023-09-13 ENCOUNTER — Telehealth: Payer: Self-pay | Admitting: Internal Medicine

## 2023-09-13 NOTE — Telephone Encounter (Signed)
 Patient needs labs for pharmacy.  Please schedule patient for CBC and BMET.

## 2023-09-13 NOTE — Telephone Encounter (Signed)
   Pre-operative Risk Assessment    Patient Name: Wesley [name]  DOB: 1945/03/07 MRN: [id number]   Date of last office visit: N/A Date of next office visit: N/A   Request for Surgical Clearance    Procedure:  Aquablation of the Prostate  Date of Surgery:  Clearance 11/15/23                                Surgeon:  Dr. Donnice Brooks Surgeon's Group or Practice Name:  [facility] Phone number:  [phone] Fax number:  [phone]   Type of Clearance Requested:   - Pharmacy:  Hold Apixaban  (Eliquis ) Hold 2 days Prior   Type of Anesthesia:  General    Additional requests/questions:  Please advise surgeon/provider what medications should be held.  Signed, Belisicia T Woods   09/13/2023, 1:31 PM

## 2023-09-16 NOTE — Telephone Encounter (Signed)
 Will route to the preop pool for the Preop APP to advise on if the patient needs in office appt or tele appt in addition to the labs that need to be completed.

## 2023-09-17 ENCOUNTER — Other Ambulatory Visit: Payer: Self-pay

## 2023-09-17 DIAGNOSIS — Z01810 Encounter for preprocedural cardiovascular examination: Secondary | ICD-10-CM

## 2023-09-17 LAB — CBC
Hematocrit: 46.4 % (ref 37.5–51.0)
Hemoglobin: 15.5 g/dL (ref 13.0–17.7)
MCH: 31.3 pg (ref 26.6–33.0)
MCHC: 33.4 g/dL (ref 31.5–35.7)
MCV: 94 fL (ref 79–97)
Platelets: 264 x10E3/uL (ref 150–450)
RBC: 4.96 x10E6/uL (ref 4.14–5.80)
RDW: 13.5 % (ref 11.6–15.4)
WBC: 6.6 x10E3/uL (ref 3.4–10.8)

## 2023-09-17 LAB — BASIC METABOLIC PANEL WITH GFR
BUN/Creatinine Ratio: 11 (ref 10–24)
BUN: 15 mg/dL (ref 8–27)
CO2: 20 mmol/L (ref 20–29)
Calcium: 9.4 mg/dL (ref 8.6–10.2)
Chloride: 103 mmol/L (ref 96–106)
Creatinine, Ser: 1.38 mg/dL — ABNORMAL HIGH (ref 0.76–1.27)
Glucose: 121 mg/dL — ABNORMAL HIGH (ref 70–99)
Potassium: 4.4 mmol/L (ref 3.5–5.2)
Sodium: 140 mmol/L (ref 134–144)
eGFR: 52 mL/min/1.73 — ABNORMAL LOW (ref >59)

## 2023-09-17 NOTE — Telephone Encounter (Signed)
 Patient will be in today for labs. I will place orders now.

## 2023-09-19 ENCOUNTER — Encounter: Payer: Self-pay | Admitting: Physical Therapy

## 2023-09-19 ENCOUNTER — Ambulatory Visit: Admitting: Physical Therapy

## 2023-09-19 DIAGNOSIS — R262 Difficulty in walking, not elsewhere classified: Secondary | ICD-10-CM | POA: Diagnosis not present

## 2023-09-19 DIAGNOSIS — R2689 Other abnormalities of gait and mobility: Secondary | ICD-10-CM | POA: Diagnosis not present

## 2023-09-19 DIAGNOSIS — R2681 Unsteadiness on feet: Secondary | ICD-10-CM | POA: Diagnosis not present

## 2023-09-19 DIAGNOSIS — M6281 Muscle weakness (generalized): Secondary | ICD-10-CM | POA: Diagnosis not present

## 2023-09-19 NOTE — Therapy (Signed)
 OUTPATIENT PHYSICAL THERAPY VESTIBULAR TREATMENT     Patient Name: Wesley [name] MRN: [id number] DOB:30-May-1945, 78 y.o., male Today's Date: 09/19/2023  END OF SESSION:  PT End of Session - 09/19/23 1316     Visit Number 7    Number of Visits 13    Date for Recertification  09/27/23    Authorization Type [employer]    Authorization Time Period approved 13 PT visits from 08/12/2023-09/27/2023    Authorization - Visit Number 7    Authorization - Number of Visits 13    Progress Note Due on Visit 10    PT Start Time 1317    PT Stop Time 1359    PT Time Calculation (min) 42 min    Equipment Utilized During Treatment Gait belt   will need gait belt d/t imbalance, hx of falls   Activity Tolerance Patient tolerated treatment well    Behavior During Therapy WFL for tasks assessed/performed            Past Medical History:  Diagnosis Date   Depression    Diverticulosis    Hyperlipidemia    Hypertension    Migraine    OSA on CPAP    mild obstructive sleep apnea with an AHI of 8 and O2 saturations as low as 85%.  On CPAP 10cm H2O   Past Surgical History:  Procedure Laterality Date   ATRIAL FIBRILLATION ABLATION N/A 10/17/2022   Procedure: ATRIAL FIBRILLATION ABLATION;  Surgeon: Kennyth Chew, MD;  Location: [facility] INVASIVE CV LAB;  Service: Cardiovascular;  Laterality: N/A;   CARDIOVERSION N/A 05/24/2021   Procedure: CARDIOVERSION;  Surgeon: Kate Lonni CROME, MD;  Location: [facility] ENDOSCOPY;  Service: Cardiovascular;  Laterality: N/A;   CARDIOVERSION N/A 07/31/2022   Procedure: CARDIOVERSION;  Surgeon: Sheena Pugh, DO;  Location: [facility] INVASIVE CV LAB;  Service: Cardiovascular;  Laterality: N/A;   CYSTOSCOPY WITH INSERTION OF UROLIFT N/A 04/28/2019   Procedure: CYSTOSCOPY WITH INSERTION OF UROLIFT;  Surgeon: Nieves Cough, MD;  Location: [facility];  Service: Urology;  Laterality: N/A;   GANGLION CYST EXCISION     PROSTATE BIOPSY N/A 04/28/2019    Procedure: BIOPSY TRANSRECTAL ULTRASONIC PROSTATE (TUBP);  Surgeon: Nieves Cough, MD;  Location: [facility];  Service: Urology;  Laterality: N/A;   Patient Active Problem List   Diagnosis Date Noted   Hypercoagulable state due to persistent atrial fibrillation (HCC) 08/23/2022   OSA on CPAP 01/03/2022   Persistent atrial fibrillation (HCC)    Diverticulitis of colon with perforation 12/18/2020   Bruxism 05/07/2017   Laryngopharyngeal reflux (LPR) 05/07/2017   Presbycusis of both ears 05/07/2017   Hemorrhoids, external, thrombosed 02/13/2012    PCP: Chrystal Lamarr RAMAN, MD  REFERRING PROVIDER: Chrystal Lamarr RAMAN, MD   REFERRING DIAG: R42 (ICD-10-CM) - Dizziness   THERAPY DIAG:  Unsteadiness on feet  Other abnormalities of gait and mobility  ONSET DATE: 08/06/2023 (MD referral)  Rationale for Evaluation and Treatment: Rehabilitation  SUBJECTIVE:   SUBJECTIVE STATEMENT: Did have a fall on the deck and bumped the wall on the side of my face.  Had Covid (9/6-9/7 was Day 1).    Pt accompanied by: self  PERTINENT HISTORY: reported memory issues, BPH, prostate cancer (possible upcoming surgery for the enlarged prostate-aquablation); atrial fibrillation, hypertension, hyperlipidemia, OSA, osteoarthritis, migraine headaches, history of kidney stones, diverticulosis, anxiety, depression, and overweight state, who reports problems with his balance and dizziness since he was diagnosed with A-fib about a year or year and  a half ago   PAIN:  Are you having pain? No  PRECAUTIONS: Fall and Other: wears L hand splint  RED FLAGS: None   WEIGHT BEARING RESTRICTIONS: No  FALLS: Has patient fallen in last 6 months? Yes. Number of falls 1-stepped down off the patio and lost balance  LIVING ENVIRONMENT: Lives with: lives with their spouse Lives in: House/apartment Stairs: bilateral rails for steps to enter home; bedroom on first level Has following equipment at  home: None  PLOF: Independent, Vocation/Vocational requirements: retired Optician, dispensing, and Leisure: enjoys family time, church  PATIENT GOALS: To help with balance  OBJECTIVE:      TODAY'S TREATMENT: 09/19/2023 Activity Comments  Forward/back walking Forward/back tandem gait Forward/back tandem march  1-2 min each, light UE support in parallel bars  Standing on Airex: Feet apart/together EO/EC head turns/nods   Resisted monster-walk forward/back, 3 reps Resisted sidestepping R and L, 3 reps Red band  Gait with cane: Head turns/head nods 180 turn and stop Figure-8 turns Obstacle negotiation-stepping around and over obstacles, stepping on river stones  Cues for correct cane sequence, occasional imbalance with incorrect cane sequence              TODAY'S TREATMENT: 09/05/23 Activity Comments  Nustep L5 x 6 min UEs/LEs   At counter for safety with cane on R: - Staggered stance rocking fwd/bwd 2x10 - Tandem stance VOR x1 vertical and horizontal 2x30 - Tandem walking fwd/bwd with cane x5 laps by counter - Stabilizing on one leg, tennis ball roll fwd/bwd under other foot 2x30 - Feet apart on airex VOR x1 vertical and horizontal x30, feet together x30 - Feet apart and then together on airex vestibular ball x30 CW & CCW - Feet apart and then together on airex trunk rotations x30     gait training with cane- SPC  Improved sequencing      PATIENT EDUCATION: Education details: Initial discussion on POC-consider recert versus d/c before 9/26.  Encouraged pt to get back into routine of HEP (as he has missed performing HEP due to recent bout of Covid).  Discussed use of vision for balance-looking ahead 10-20 ft on ground to anticipate obstacles Person educated: Patient Education method: Explanation, Demonstration, Tactile cues, Verbal cues, and Handouts Education comprehension: verbalized understanding and returned demonstration   Access Code: [email or web address] URL:  [email or web address] Date: 08/26/2023 Prepared by: [facility] - Outpatient  Rehab - [facility]  Program Notes perform at counter for safety  Exercises - Tandem Walking with Counter Support  - 1 x daily - 5 x weekly - 2 sets - 5 reps - Backward Walking with Counter Support  - 1 x daily - 5 x weekly - 2 sets - 5 reps - Standing Gaze Stabilization with Head Nod  - 1 x daily - 7 x weekly - 2 sets - 30 sec hold - Standing Gaze Stabilization with Head Rotation  - 1 x daily - 7 x weekly - 2 sets - 30 sec hold - Standing Feet Together Smooth Pursuit  - 1 x daily - 7 x weekly - 2 sets - 30 sec hold - 180 Degree Pivot Turn with Single Point Cane  - 1-2 x daily - 7 x weekly - 1 sets - 5 reps - Romberg Stance with Eyes Closed  - 1-2 x daily - 7 x weekly - 2 sets - 5-10 reps    Note: Objective measures were completed at Evaluation unless otherwise noted.  DIAGNOSTIC FINDINGS: MRI 06/26/2023 IMPRESSION: 1. No  acute intracranial abnormality. 2. Mild-to-moderate chronic small vessel ischemic disease. 3. Moderate to severe cerebral atrophy.  COGNITION: Overall cognitive status: History of cognitive impairments - at baseline and reports memory/word finding changes   SENSATION: Light touch: WFL*does have question about sensation, as he lost a toenail and didn't know it  POSTURE:  rounded shoulders and forward head  Cervical ROM:  crepitus, but wfL  Active A/PROM (deg) eval  Flexion   Extension   Right lateral flexion   Left lateral flexion   Right rotation   Left rotation   (Blank rows = not tested)   LOWER EXTREMITY MMT:   MMT Right eval Left eval  Hip flexion 4 5  Hip abduction 4 4  Hip adduction 4+ 4+  Hip internal rotation    Hip external rotation    Knee flexion 5 5  Knee extension 5 5  Ankle dorsiflexion 4 4  Ankle plantarflexion    Ankle inversion    Ankle eversion    (Blank rows = not tested)  TRANSFERS: Assistive device utilized: None  Sit  to stand: Modified independence Stand to sit: Modified independence  GAIT: Gait pattern: step through pattern, ataxic, and wide BOS Distance walked: 50 ft Assistive device utilized: None Level of assistance: SBA Comments: Looks down at feet with gait  FUNCTIONAL TESTS:  5 times sit to stand: 18.94 sec with knee pain Timed up and go (TUG): 15.94 sec 10 meter walk test: TBA FGA:  TBA  M-CTSIB  Condition 1: Firm Surface, EO 30 Sec, Normal Sway  Condition 2: Firm Surface, EC 30 Sec, Mild Sway (*this is the dizziness)  Condition 3: Foam Surface, EO 30 Sec, Mild Sway  Condition 4: Foam Surface, EC 27.9 Sec, Severe Sway      VESTIBULAR ASSESSMENT:  GENERAL OBSERVATION: cautious, guarded motion   SYMPTOM BEHAVIOR:  Subjective history: Feels like head is spinning, like I'm not firmly planted on the ground.  Don't feel stable standing.  Been going on for several years.  Non-Vestibular symptoms: tinnitus-long standing  Type of dizziness: Imbalance (Disequilibrium) and Unsteady with head/body turns  Frequency: daily, anytime I get up  Duration: while I'm up  Aggravating factors: Induced by motion: occur when walking, activity in general, and standing  Relieving factors: sitting  Progression of symptoms: unchanged  *DID NOT COMPLETE FURTHER VESTIBULAR COMPONENT OF ASSESSMENT, as pt reports more unsteadiness with standing/gait as primary symptom                                                                                                                            TREATMENT DATE: 08/12/2023   PATIENT EDUCATION: Education details: Eval results, POC Person educated: Patient Education method: Explanation Education comprehension: verbalized understanding  HOME EXERCISE PROGRAM:  GOALS: Goals reviewed with patient? Yes  SHORT TERM GOALS: Target date: 08/30/2023  Pt will be independent with HEP for improved balance, gait. Baseline: Goal status:  MET 08/30/23  2.  Pt will  improve 5x sit<>stand to less than or equal to 15 sec to demonstrate improved functional strength and transfer efficiency. Baseline: 18.94 sec; 13.62 sec with first few reps pushing off LEs 08/30/23 Goal status: MET 08/30/23   LONG TERM GOALS: Target date: 09/27/2023  Pt will be independent with HEP for improved balance, gait. Baseline:  Goal status: IN PROGRESS  2.  Pt will improve TUG score to less than or equal to 13.5  sec for decreased fall risk. Baseline: 15.94 sec; 13.62 sec with first few reps pushing off LEs 08/30/23 Goal status: IN PROGRESS 08/30/23   3.  Pt will improve FGA score to at least 23/30 to decrease fall risk. Baseline: 16/30 08/19/23 Goal status: IN PROGRESS 08/19/23  4.  Pt will perform Condition 4 on MCTSIB 30 sec with moderate or less sway for improved balance. Baseline: 27.9 sec severe sway Goal status: IN PROGRESS  5.  Pt will verbalize understanding of fall prevention in home environment. Baseline:  Goal status: IN PROGRESS  6.  Pt will verbalize plans for continued community fitness upon d/c from PT, to maximize gains in PT. Baseline: No current HEP Goal status: IN PROGRESS  ASSESSMENT:  CLINICAL IMPRESSION: Pt presents today, having missed PT session last week due to having Covid.  He also reports a fall where he hit the side of his face on a wall.  He is unsure of the mechanism of the fall. Skilled PT session focused on dynamic and static balance, strengthening and gait.  Dynamic balance with resistance-sidestepping and monster walking provides challenge and addressed hip stability.  Gait training with cane with head motions, obstacle negotiation work-he has some imbalance when getting cane off sequence.  Discussed using visual target-perhaps looking 10-20 ft ahead on ground to be able to anticipate obstacles, versus looking down directly at feet. Pt will continue to benefit from skilled PT towards goals for improved functional mobility and decreased fall  risk.  OBJECTIVE IMPAIRMENTS: Abnormal gait, decreased balance, decreased knowledge of use of DME, decreased mobility, difficulty walking, and decreased strength.   ACTIVITY LIMITATIONS: bending, standing, squatting, transfers, and locomotion level  PARTICIPATION LIMITATIONS: shopping, community activity, and occupation  PERSONAL FACTORS: 3+ comorbidities: see above are also affecting patient's functional outcome.   REHAB POTENTIAL: Good  CLINICAL DECISION MAKING: Evolving/moderate complexity  EVALUATION COMPLEXITY: Moderate   PLAN:  PT FREQUENCY: 1-2x/week  PT DURATION: 6 weeks plus eval  PLANNED INTERVENTIONS: 97750- Physical Performance Testing, 97110-Therapeutic exercises, 97530- Therapeutic activity, [id number]- Neuromuscular re-education, 97535- Self Care, [id number]- Manual therapy, [id number]- Gait training, [phone]- Aquatic Therapy, Patient/Family education, Balance training, and Vestibular training  PLAN FOR NEXT SESSION: Balance, gait, turns-check LTGs next week-and discuss POC and recert/discharge. Consider addition of resisted sidestepping and/or resisted monster walk to [employer], PT 09/19/23 2:05 PM Phone: [phone] Fax: [phone]  [facility] Outpatient Rehab at [facility] [address] [place], [address] [place], [place] [address] Phone # [phone] Fax # [phone]

## 2023-09-20 NOTE — Telephone Encounter (Signed)
   Name: [name]  DOB: 1945/12/01  MRN: [id number]  Primary Cardiologist: Arun K Thukkani, MD  Chart reviewed as part of pre-operative protocol coverage. Because of Wesley [name]'s past medical history and time since last visit, he will require a follow-up telephone visit in order to better assess preoperative cardiovascular risk.  Pre-op covering staff: - Please schedule appointment and call patient to inform them. If patient already had an upcoming appointment within acceptable timeframe, please add pre-op clearance to the appointment notes so provider is aware. - Please contact requesting surgeon's office via preferred method (i.e, phone, fax) to inform them of need for appointment prior to surgery.  Patient has not had an Afib/aflutter ablation or Watchman within the last 3 months or DCCV within the last 30 days    Per office protocol, patient can hold Eliquis  for 2 days prior to procedure.  Please resume when medically safe to do so  Patient lives in [place] .  Wesley LOISE Fabry, PA-C  09/20/2023, 8:27 AM

## 2023-09-20 NOTE — Telephone Encounter (Signed)
 Patient with diagnosis of afib on Eliquis  for anticoagulation.    Procedure:  Aquablation of the Prostate  Date of procedure: 11/15/23   CHA2DS2-VASc Score = 3   This indicates a 3.2% annual risk of stroke. The patient's score is based upon: CHF History: 0 HTN History: 1 Diabetes History: 0 Stroke History: 0 Vascular Disease History: 0 Age Score: 2 Gender Score: 0      CrCl 54 ml/min Platelet count 264  Patient has not had an Afib/aflutter ablation or Watchman within the last 3 months or DCCV within the last 30 days   Per office protocol, patient can hold Eliquis  for 2 days prior to procedure.    **This guidance is not considered finalized until pre-operative APP has relayed final recommendations.**

## 2023-09-20 NOTE — Telephone Encounter (Signed)
 Appointment scheduled on 11/06/2023. Med req and consent complete. Call patient at [phone]

## 2023-09-26 ENCOUNTER — Ambulatory Visit: Admitting: Physical Therapy

## 2023-09-27 DIAGNOSIS — L820 Inflamed seborrheic keratosis: Secondary | ICD-10-CM | POA: Diagnosis not present

## 2023-09-27 DIAGNOSIS — L2989 Other pruritus: Secondary | ICD-10-CM | POA: Diagnosis not present

## 2023-09-27 DIAGNOSIS — L309 Dermatitis, unspecified: Secondary | ICD-10-CM | POA: Diagnosis not present

## 2023-09-27 DIAGNOSIS — L718 Other rosacea: Secondary | ICD-10-CM | POA: Diagnosis not present

## 2023-09-27 DIAGNOSIS — L814 Other melanin hyperpigmentation: Secondary | ICD-10-CM | POA: Diagnosis not present

## 2023-09-27 DIAGNOSIS — L821 Other seborrheic keratosis: Secondary | ICD-10-CM | POA: Diagnosis not present

## 2023-09-27 DIAGNOSIS — F411 Generalized anxiety disorder: Secondary | ICD-10-CM | POA: Diagnosis not present

## 2023-09-27 DIAGNOSIS — L538 Other specified erythematous conditions: Secondary | ICD-10-CM | POA: Diagnosis not present

## 2023-09-27 DIAGNOSIS — F332 Major depressive disorder, recurrent severe without psychotic features: Secondary | ICD-10-CM | POA: Diagnosis not present

## 2023-10-10 DIAGNOSIS — F411 Generalized anxiety disorder: Secondary | ICD-10-CM | POA: Diagnosis not present

## 2023-10-10 DIAGNOSIS — F332 Major depressive disorder, recurrent severe without psychotic features: Secondary | ICD-10-CM | POA: Diagnosis not present

## 2023-10-18 DIAGNOSIS — N401 Enlarged prostate with lower urinary tract symptoms: Secondary | ICD-10-CM | POA: Diagnosis not present

## 2023-10-23 ENCOUNTER — Telehealth: Payer: Self-pay | Admitting: Neurology

## 2023-10-23 DIAGNOSIS — R413 Other amnesia: Secondary | ICD-10-CM | POA: Diagnosis not present

## 2023-10-23 DIAGNOSIS — F330 Major depressive disorder, recurrent, mild: Secondary | ICD-10-CM | POA: Diagnosis not present

## 2023-10-23 DIAGNOSIS — R7303 Prediabetes: Secondary | ICD-10-CM | POA: Diagnosis not present

## 2023-10-23 DIAGNOSIS — I10 Essential (primary) hypertension: Secondary | ICD-10-CM | POA: Diagnosis not present

## 2023-10-23 DIAGNOSIS — R2681 Unsteadiness on feet: Secondary | ICD-10-CM | POA: Diagnosis not present

## 2023-10-23 NOTE — Telephone Encounter (Signed)
 Spoke w/ Rosina at [place] [facility] and Dr. Chrystal is requesting patient follow up with Dr. Buck as patient's memory issues have gotten worse and she would like for us  to go over his MRI with him as well. Ashley's going to fax the note from his visit today and I've left a VM for pt in regards to scheduling an apt.

## 2023-10-24 DIAGNOSIS — F411 Generalized anxiety disorder: Secondary | ICD-10-CM | POA: Diagnosis not present

## 2023-10-24 DIAGNOSIS — F332 Major depressive disorder, recurrent severe without psychotic features: Secondary | ICD-10-CM | POA: Diagnosis not present

## 2023-10-28 DIAGNOSIS — N401 Enlarged prostate with lower urinary tract symptoms: Secondary | ICD-10-CM | POA: Diagnosis not present

## 2023-10-28 DIAGNOSIS — C61 Malignant neoplasm of prostate: Secondary | ICD-10-CM | POA: Diagnosis not present

## 2023-10-28 DIAGNOSIS — R399 Unspecified symptoms and signs involving the genitourinary system: Secondary | ICD-10-CM | POA: Diagnosis not present

## 2023-11-01 NOTE — Patient Instructions (Signed)
 SURGICAL WAITING ROOM VISITATION Patients having surgery or a procedure may have no more than 2 support people in the waiting area - these visitors may rotate in the visitor waiting room.   Due to an increase in RSV and influenza rates and associated hospitalizations, children ages 28 and under may not visit patients in [facility] hospitals. If the patient needs to stay at the hospital during part of their recovery, the visitor guidelines for inpatient rooms apply.  PRE-OP VISITATION  Pre-op nurse will coordinate an appropriate time for 1 support person to accompany the patient in pre-op.  This support person may not rotate.  This visitor will be contacted when the time is appropriate for the visitor to come back in the pre-op area.  Please refer to the [facility] website for the visitor guidelines for Inpatients (after your surgery is over and you are in a regular room).  You are not required to quarantine at this time prior to your surgery. However, you must do this: Hand Hygiene often Do NOT share personal items Notify your provider if you are in close contact with someone who has COVID or you develop fever 100.4 or greater, new onset of sneezing, cough, sore throat, shortness of breath or body aches.  If you test positive for Covid or have been in contact with anyone that has tested positive in the last 10 days please notify you surgeon.    Your procedure is scheduled on: 11/15/23   Report to [facility] Main Entrance: [address] entrance where the [employer] is available.   Report to admitting at:5;15 AM  Call this number if you have any questions or problems the morning of surgery [phone]  FOLLOW ANY ADDITIONAL PRE OP INSTRUCTIONS YOU RECEIVED FROM YOUR SURGEON'S OFFICE!!!  Do not eat food or drink fluids after Midnight the night prior to your surgery/procedure.   Oral Hygiene is also important to reduce your risk of infection.        Remember - BRUSH YOUR TEETH  THE MORNING OF SURGERY WITH YOUR REGULAR TOOTHPASTE  Do NOT smoke after Midnight the night before surgery.  STOP TAKING all Vitamins, Herbs and supplements 1 week before your surgery.   Take ONLY these medicines the morning of surgery with A SIP OF WATER : bupropion ,tamsulosin .                 You may not have any metal on your body including hair pins, jewelry, and body piercing  Do not wear lotions, powders, perfumes / cologne, or deodorant  Men may shave face and neck.  Contacts, Hearing Aids, dentures or bridgework may not be worn into surgery. DENTURES WILL BE REMOVED PRIOR TO SURGERY PLEASE DO NOT APPLY Poly grip OR ADHESIVES!!!  You may bring a small overnight bag with you on the day of surgery, only pack items that are not valuable. [facility] IS NOT RESPONSIBLE   FOR VALUABLES THAT ARE LOST OR STOLEN.   Patients discharged on the day of surgery will not be allowed to drive home.  Someone NEEDS to stay with you for the first 24 hours after anesthesia.  Do not bring your home medications to the hospital. The Pharmacy will dispense medications listed on your medication list to you during your admission in the Hospital.  Special Instructions: Bring a copy of your healthcare power of attorney and living will documents the day of surgery, if you wish to have them scanned into your [facility] Medical Records- EPIC  Please read over the following fact sheets you were given: IF YOU HAVE QUESTIONS ABOUT YOUR PRE-OP INSTRUCTIONS, PLEASE CALL [phone]   [facility] - Preparing for Surgery      Before surgery, you can play an important role.  Because skin is not sterile, your skin needs to be as free of germs as possible.  You can reduce the number of germs on your skin by washing with CHG (chlorahexidine gluconate) soap before surgery.  CHG is an antiseptic cleaner which kills germs and bonds with the skin to continue killing germs even after washing. Please DO NOT use if you have an  allergy to CHG or antibacterial soaps.  If your skin becomes reddened/irritated stop using the CHG and inform your nurse when you arrive at Short Stay. Do not shave (including legs and underarms) for at least 48 hours prior to the first CHG shower.  You may shave your face/neck.  Please follow these instructions carefully:  1.  Shower with CHG Soap the night before surgery ONLY (DO NOT USE THE SOAP THE MORNING OF SURGERY).  2.  If you choose to wash your hair, wash your hair first as usual with your normal  shampoo.  3.  After you shampoo, rinse your hair and body thoroughly to remove the shampoo.                             4.  Use CHG as you would any other liquid soap.  You can apply chg directly to the skin and wash.  Gently with a scrungie or clean washcloth.  5.  Apply the CHG Soap to your body ONLY FROM THE NECK DOWN.   Do not use on face/ open                           Wound or open sores. Avoid contact with eyes, ears mouth and genitals (private parts).                       Wash face,  Genitals (private parts) with your normal soap.             6.  Wash thoroughly, paying special attention to the area where your  surgery  will be performed.  7.  Thoroughly rinse your body with warm water  from the neck down.  8.  DO NOT shower/wash with your normal soap after using and rinsing off the CHG Soap.                9.  Pat yourself dry with a clean towel.            10.  Wear clean pajamas.            11.  Place clean sheets on your bed the night of your first shower and do not  sleep with pets.  Day of Surgery : Do not apply any CHG, lotions/deodorants the morning of surgery.  Please wear clean clothes to the hospital/surgery center.   FAILURE TO FOLLOW THESE INSTRUCTIONS MAY RESULT IN THE CANCELLATION OF YOUR SURGERY  PATIENT SIGNATURE_________________________________  NURSE  SIGNATURE__________________________________  ________________________________________________________________________

## 2023-11-04 ENCOUNTER — Other Ambulatory Visit: Payer: Self-pay

## 2023-11-04 ENCOUNTER — Encounter (HOSPITAL_COMMUNITY): Payer: Self-pay

## 2023-11-04 ENCOUNTER — Encounter (HOSPITAL_COMMUNITY)
Admission: RE | Admit: 2023-11-04 | Discharge: 2023-11-04 | Disposition: A | Source: Ambulatory Visit | Attending: Urology

## 2023-11-04 VITALS — BP 166/95 | HR 76 | Temp 98.2°F | Ht 71.0 in | Wt 182.0 lb

## 2023-11-04 DIAGNOSIS — I10 Essential (primary) hypertension: Secondary | ICD-10-CM | POA: Insufficient documentation

## 2023-11-04 DIAGNOSIS — N138 Other obstructive and reflux uropathy: Secondary | ICD-10-CM | POA: Insufficient documentation

## 2023-11-04 DIAGNOSIS — N401 Enlarged prostate with lower urinary tract symptoms: Secondary | ICD-10-CM | POA: Diagnosis not present

## 2023-11-04 DIAGNOSIS — I4819 Other persistent atrial fibrillation: Secondary | ICD-10-CM | POA: Diagnosis not present

## 2023-11-04 DIAGNOSIS — G4733 Obstructive sleep apnea (adult) (pediatric): Secondary | ICD-10-CM | POA: Diagnosis not present

## 2023-11-04 DIAGNOSIS — Z7901 Long term (current) use of anticoagulants: Secondary | ICD-10-CM | POA: Diagnosis not present

## 2023-11-04 DIAGNOSIS — Z01812 Encounter for preprocedural laboratory examination: Secondary | ICD-10-CM | POA: Diagnosis not present

## 2023-11-04 DIAGNOSIS — Z01818 Encounter for other preprocedural examination: Secondary | ICD-10-CM | POA: Diagnosis present

## 2023-11-04 HISTORY — DX: Anxiety disorder, unspecified: F41.9

## 2023-11-04 HISTORY — DX: Cardiac arrhythmia, unspecified: I49.9

## 2023-11-04 HISTORY — DX: Unspecified osteoarthritis, unspecified site: M19.90

## 2023-11-04 HISTORY — DX: Personal history of urinary calculi: Z87.442

## 2023-11-04 HISTORY — DX: Prediabetes: R73.03

## 2023-11-04 HISTORY — DX: Malignant (primary) neoplasm, unspecified: C80.1

## 2023-11-04 LAB — BASIC METABOLIC PANEL WITH GFR
Anion gap: 10 (ref 5–15)
BUN: 16 mg/dL (ref 8–23)
CO2: 26 mmol/L (ref 22–32)
Calcium: 9.3 mg/dL (ref 8.9–10.3)
Chloride: 104 mmol/L (ref 98–111)
Creatinine, Ser: 1.3 mg/dL — ABNORMAL HIGH (ref 0.61–1.24)
GFR, Estimated: 56 mL/min — ABNORMAL LOW (ref >60)
Glucose, Bld: 110 mg/dL — ABNORMAL HIGH (ref 70–99)
Potassium: 3.9 mmol/L (ref 3.5–5.1)
Sodium: 140 mmol/L (ref 135–145)

## 2023-11-04 LAB — CBC
HCT: 48.1 % (ref 39.0–52.0)
Hemoglobin: 15.4 g/dL (ref 13.0–17.0)
MCH: 30.6 pg (ref 26.0–34.0)
MCHC: 32 g/dL (ref 30.0–36.0)
MCV: 95.6 fL (ref 80.0–100.0)
Platelets: 260 K/uL (ref 150–400)
RBC: 5.03 MIL/uL (ref 4.22–5.81)
RDW: 15 % (ref 11.5–15.5)
WBC: 6.5 K/uL (ref 4.0–10.5)
nRBC: 0 % (ref 0.0–0.2)

## 2023-11-04 NOTE — Progress Notes (Addendum)
 For Anesthesia: PCP - Chrystal Lamarr RAMAN, MD  Cardiologist - Wendel Lurena POUR, MD  Clearance: pending appointment on: 11/06/23 Bowel Prep reminder:N/A  Chest x-ray - CT cardiac: 10/15/22 EKG - 01/11/23 Stress Test -  ECHO - 06/05/22 Cardiac Cath -  Pacemaker/ICD device last checked: Pacemaker orders received: Device Rep notified:  Spinal Cord Stimulator:N/A  Sleep Study - Yes CPAP - NO  Fasting Blood Sugar - N/A Checks Blood Sugar _____ times a day Date and result of last Hgb A1c-  Last dose of GLP1 agonist- N/A GLP1 instructions: Hold 7 days prior to schedule (Hold 24 hours-daily)   Last dose of SGLT-2 inhibitors- N/A SGLT-2 instructions: Hold 72 hours prior to surgery  Blood Thinner Instructions: Eliquis  will be hold after: 11/11/23(pt. Is taking only one dose in the morning) Last Dose: Time last taken:  Aspirin Instructions: Last Dose: Time last taken:  Activity level: Can go up a flight of stairs and activities of daily living without stopping and without chest pain and/or shortness of breath   Able to exercise without chest pain and/or shortness of breath  Anesthesia review: Hx: HTN,Afib,Pre-DIA,OSA(NO using the CPAP).  Patient denies shortness of breath, fever, cough and chest pain at PAT appointment   Patient verbalized understanding of instructions that were reviewed over the telephone.

## 2023-11-06 ENCOUNTER — Ambulatory Visit: Attending: Cardiology | Admitting: Physician Assistant

## 2023-11-06 DIAGNOSIS — Z01810 Encounter for preprocedural cardiovascular examination: Secondary | ICD-10-CM | POA: Diagnosis not present

## 2023-11-06 NOTE — Anesthesia Preprocedure Evaluation (Addendum)
 Anesthesia Evaluation  Patient identified by MRN, date of birth, ID band Patient awake    Reviewed: Allergy & Precautions, H&P , NPO status , Patient's Chart, lab work & pertinent test results  History of Anesthesia Complications Negative for: history of anesthetic complications  Airway Mallampati: II  TM Distance: >3 FB Neck ROM: Full    Dental no notable dental hx.    Pulmonary sleep apnea    Pulmonary exam normal breath sounds clear to auscultation       Cardiovascular hypertension, (-) angina (-) Past MI Normal cardiovascular exam+ dysrhythmias Atrial Fibrillation  Rhythm:Regular Rate:Normal     Neuro/Psych  Headaches, neg Seizures PSYCHIATRIC DISORDERS Anxiety Depression       GI/Hepatic negative GI ROS, Neg liver ROS,,,  Endo/Other  negative endocrine ROS    Renal/GU negative Renal ROS   Enlarged prostate with urinary obstruction    Musculoskeletal negative musculoskeletal ROS (+)    Abdominal   Peds negative pediatric ROS (+)  Hematology negative hematology ROS (+) Eliquis     Anesthesia Other Findings   Reproductive/Obstetrics negative OB ROS                              Anesthesia Physical Anesthesia Plan  ASA: 3  Anesthesia Plan: General   Post-op Pain Management:    Induction: Intravenous  PONV Risk Score and Plan: 2 and Ondansetron , Dexamethasone  and Treatment may vary due to age or medical condition  Airway Management Planned: Oral ETT  Additional Equipment: None  Intra-op Plan:   Post-operative Plan: Extubation in OR  Informed Consent: I have reviewed the patients History and Physical, chart, labs and discussed the procedure including the risks, benefits and alternatives for the proposed anesthesia with the patient or authorized representative who has indicated his/her understanding and acceptance.     Dental advisory given  Plan Discussed with:  CRNA  Anesthesia Plan Comments: (See PAT note 11/04/23  78 y.o. never smoker with h/o HTN, OSA on CPAP, atrial fibrillation, BPH scheduled for above procedure 11/15/2023 with Dr. Donnice Brooks.    Per cardiology preoperative evaluation 11/06/2023, Mr. Looper's perioperative risk of a major cardiac event is 0.4% according to the Revised Cardiac Risk Index (RCRI).  Therefore, he is at low risk for perioperative complications.   His functional capacity is good at 5.07 METs according to the Duke Activity Status Index (DASI). Recommendations: According to ACC/AHA guidelines, no further cardiovascular testing needed.  The patient may proceed to surgery at acceptable risk.   Antiplatelet and/or Anticoagulation Recommendations:   Eliquis  (Apixaban ) can be held for 2 days prior to surgery.  Please resume post op when felt to be safe. )         Anesthesia Quick Evaluation

## 2023-11-06 NOTE — Progress Notes (Signed)
 Virtual Visit via Telephone Note   Because of Wesley [name] co-morbid illnesses, he is at least at moderate risk for complications without adequate follow up.  This format is felt to be most appropriate for this patient at this time.  Due to technical limitations with video connection (technology), today's appointment will be conducted as an audio only telehealth visit, and Wesley [name] verbally agreed to proceed in this manner.   All issues noted in this document were discussed and addressed.  No physical exam could be performed with this format.  Evaluation Performed:  Preoperative cardiovascular risk assessment _____________   Date:  11/06/2023   Patient ID:  Wesley [name], Wesley [name] 1945-06-09, MRN [id number] Patient Location:  Home Provider location:   Office  Primary Care Provider:  Chrystal Lamarr RAMAN, MD Primary Cardiologist:  Arun K Thukkani, MD  Chief Complaint / Patient Profile   78 y.o. y/o male with a h/o atrial fibrillation, hypertension, hyperlipidemia, aortic atherosclerosis by CT, OSA on CPAP, LPR, prostate cancer who is pending aqua ablation of the prostate and presents today for telephonic preoperative cardiovascular risk assessment.  History of Present Illness    Wesley [name] is a 78 y.o. male who presents via audio/video conferencing for a telehealth visit today.  Pt was last seen in cardiology clinic on 07/10/2023 by Daphne Barrack, NP.  At that time Wesley [name] was doing well other than some dizziness for which she has been seen by neurology.  The patient is now pending procedure as outlined above. Since his last visit, he  has daily check ups with his PCP, apple watch, no Afib noted. BP has been stable. No SOB or chest pain. He uses a cane to walk for stability and his PCP has diagnosed him with unsteady gate. Occasionally he has to carry wood and clean out the wood carrier. He has no symptoms with that activity.  Per office protocol, patient can  hold Eliquis  for 2 days prior to procedure.  Please resume when medically safe to do so.   Past Medical History    Past Medical History:  Diagnosis Date   Anxiety    Arthritis    Cancer (HCC)    prostate   Depression    Diverticulosis    Dysrhythmia    History of kidney stones    Hyperlipidemia    Hypertension    Migraine    OSA on CPAP    mild obstructive sleep apnea with an AHI of 8 and O2 saturations as low as 85%.  On CPAP 10cm H2O   Pre-diabetes    Past Surgical History:  Procedure Laterality Date   ATRIAL FIBRILLATION ABLATION N/A 10/17/2022   Procedure: ATRIAL FIBRILLATION ABLATION;  Surgeon: Kennyth Chew, MD;  Location: [facility] INVASIVE CV LAB;  Service: Cardiovascular;  Laterality: N/A;   CARDIOVERSION N/A 05/24/2021   Procedure: CARDIOVERSION;  Surgeon: Kate Lonni CROME, MD;  Location: [facility] ENDOSCOPY;  Service: Cardiovascular;  Laterality: N/A;   CARDIOVERSION N/A 07/31/2022   Procedure: CARDIOVERSION;  Surgeon: Sheena Pugh, DO;  Location: [facility] INVASIVE CV LAB;  Service: Cardiovascular;  Laterality: N/A;   CYSTOSCOPY WITH INSERTION OF UROLIFT N/A 04/28/2019   Procedure: CYSTOSCOPY WITH INSERTION OF UROLIFT;  Surgeon: Nieves Cough, MD;  Location: [facility];  Service: Urology;  Laterality: N/A;   GANGLION CYST EXCISION Right    right wrist   PROSTATE BIOPSY N/A 04/28/2019   Procedure: BIOPSY TRANSRECTAL ULTRASONIC PROSTATE (TUBP);  Surgeon: Nieves Cough, MD;  Location: [facility];  Service: Urology;  Laterality: N/A;   TONSILLECTOMY      Allergies  Allergies  Allergen Reactions   Ivp Dye [Iodinated Contrast Media] Hives and Swelling   Ambien  [Zolpidem ]     Hallucinations    Diphenhydramine  Other (See Comments)    Other Reaction(s): EPS sxs (restless legs)   Iohexol       Code: HIVES, Desc: PT DEVELOPED HIVES DURING IV ADMINISTRATION/    Percocet [Oxycodone-Acetaminophen ] Itching   Zetia [Ezetimibe] Nausea And  Vomiting   Penicillins Hives and Rash    Rash/hives at age of 29    Home Medications    Prior to Admission medications   Medication Sig Start Date End Date Taking? Authorizing Provider  acetaminophen  (TYLENOL ) 500 MG tablet Take 500 mg by mouth every 6 (six) hours as needed.    [provider]  atorvastatin (LIPITOR) 20 MG tablet Take 20 mg by mouth daily.    [provider]  buPROPion  (WELLBUTRIN  XL) 150 MG 24 hr tablet Take 150 mg by mouth daily.    [provider]  ELIQUIS  5 MG TABS tablet Take 1 tablet (5 mg total) by mouth 2 (two) times daily. 03/26/23   Thukkani, Arun K, MD  hydrochlorothiazide  (MICROZIDE ) 12.5 MG capsule Take 12.5 mg by mouth daily.    [provider]  losartan (COZAAR) 50 MG tablet Take 50 mg by mouth daily.    [provider]  tamsulosin  (FLOMAX ) 0.4 MG CAPS capsule Take 0.4 mg by mouth daily after breakfast.    [provider]  valACYclovir (VALTREX) 1000 MG tablet Take 1,000 mg by mouth 2 (two) times daily. Patient not taking: Reported on 10/31/2023    [provider]    Physical Exam    Vital Signs:  Wesley [name] does not have vital signs available for review today.  Given telephonic nature of communication, physical exam is limited. AAOx3. NAD. Normal affect.  Speech and respirations are unlabored.  Accessory Clinical Findings    None  Assessment & Plan    1.  Preoperative Cardiovascular Risk Assessment:  [name]'s perioperative risk of a major cardiac event is 0.4% according to the Revised Cardiac Risk Index (RCRI).  Therefore, he is at low risk for perioperative complications.   His functional capacity is good at 5.07 METs according to the Duke Activity Status Index (DASI). Recommendations: According to ACC/AHA guidelines, no further cardiovascular testing needed.  The patient may proceed to surgery at acceptable risk.   Antiplatelet and/or Anticoagulation  Recommendations:  Eliquis  (Apixaban ) can be held for 2 days prior to surgery.  Please resume post op when felt to be safe.     The patient was advised that if he develops new symptoms prior to surgery to contact our office to arrange for a follow-up visit, and he verbalized understanding.  A copy of this note will be routed to requesting surgeon.  Time:   Today, I have spent 6 minutes with the patient with telehealth technology discussing medical history, symptoms, and management plan.     Orren LOISE Fabry, PA-C  11/06/2023, 8:55 AM

## 2023-11-06 NOTE — Progress Notes (Signed)
 Anesthesia Chart Review   Case: [id number] Date/Time: 11/15/23 0715   Procedure: ABLATION, PROSTATE, TRANSURETHRAL, USING WATERJET   Anesthesia type: General   Diagnosis: Enlarged prostate with urinary obstruction [N40.1, N13.8]   Pre-op diagnosis: BENIGN PROSTATIC HYPREPLASIA   Location: [facility] PROCEDURE [facility] / [facility]   Surgeons: Nieves Cough, MD       DISCUSSION:78 y.o. never smoker with h/o HTN, OSA on CPAP, atrial fibrillation, BPH scheduled for above procedure 11/15/2023 with Dr. Cough Nieves.   Per cardiology preoperative evaluation 11/06/2023, Mr. Komperda's perioperative risk of a major cardiac event is 0.4% according to the Revised Cardiac Risk Index (RCRI).  Therefore, he is at low risk for perioperative complications.   His functional capacity is good at 5.07 METs according to the Duke Activity Status Index (DASI). Recommendations: According to ACC/AHA guidelines, no further cardiovascular testing needed.  The patient may proceed to surgery at acceptable risk.   Antiplatelet and/or Anticoagulation Recommendations:   Eliquis  (Apixaban ) can be held for 2 days prior to surgery.  Please resume post op when felt to be safe.  VS: BP (!) 166/95   Pulse 76   Temp 36.8 C   Ht 5' 11 (1.803 m)   Wt 82.6 kg   SpO2 96%   BMI 25.38 kg/m   PROVIDERS: Chrystal Lamarr RAMAN, MD is PCP  Cardiologist - Thukkani, Arun K, MD   LABS: Labs reviewed: Acceptable for surgery. (all labs ordered are listed, but only abnormal results are displayed)  Labs Reviewed  BASIC METABOLIC PANEL WITH GFR - Abnormal; Notable for the following components:      Result Value   Glucose, Bld 110 (*)    Creatinine, Ser 1.30 (*)    GFR, Estimated 56 (*)    All other components within normal limits  CBC     IMAGES:   EKG:   CV: Echo 06/05/2022 1. Left ventricular ejection fraction, by estimation, is 60 to 65%. The  left ventricle has normal function.   2. Right ventricular systolic  function is normal. The right ventricular  size is normal.   3. The mitral valve is normal in structure. No evidence of mitral valve  regurgitation.   4. Aortic valve regurgitation is not visualized.  Past Medical History:  Diagnosis Date   Anxiety    Arthritis    Cancer (HCC)    prostate   Depression    Diverticulosis    Dysrhythmia    History of kidney stones    Hyperlipidemia    Hypertension    Migraine    OSA on CPAP    mild obstructive sleep apnea with an AHI of 8 and O2 saturations as low as 85%.  On CPAP 10cm H2O   Pre-diabetes     Past Surgical History:  Procedure Laterality Date   ATRIAL FIBRILLATION ABLATION N/A 10/17/2022   Procedure: ATRIAL FIBRILLATION ABLATION;  Surgeon: Kennyth Chew, MD;  Location: [facility] INVASIVE CV LAB;  Service: Cardiovascular;  Laterality: N/A;   CARDIOVERSION N/A 05/24/2021   Procedure: CARDIOVERSION;  Surgeon: Kate Lonni CROME, MD;  Location: [facility] ENDOSCOPY;  Service: Cardiovascular;  Laterality: N/A;   CARDIOVERSION N/A 07/31/2022   Procedure: CARDIOVERSION;  Surgeon: Sheena Pugh, DO;  Location: [facility] INVASIVE CV LAB;  Service: Cardiovascular;  Laterality: N/A;   CYSTOSCOPY WITH INSERTION OF UROLIFT N/A 04/28/2019   Procedure: CYSTOSCOPY WITH INSERTION OF UROLIFT;  Surgeon: Nieves Cough, MD;  Location: [facility];  Service: Urology;  Laterality: N/A;   GANGLION CYST  EXCISION Right    right wrist   PROSTATE BIOPSY N/A 04/28/2019   Procedure: BIOPSY TRANSRECTAL ULTRASONIC PROSTATE (TUBP);  Surgeon: Nieves Cough, MD;  Location: [facility];  Service: Urology;  Laterality: N/A;   TONSILLECTOMY      MEDICATIONS:  acetaminophen  (TYLENOL ) 500 MG tablet   atorvastatin (LIPITOR) 20 MG tablet   buPROPion  (WELLBUTRIN  XL) 150 MG 24 hr tablet   ELIQUIS  5 MG TABS tablet   hydrochlorothiazide  (MICROZIDE ) 12.5 MG capsule   losartan (COZAAR) 50 MG tablet   tamsulosin  (FLOMAX ) 0.4 MG CAPS capsule    valACYclovir (VALTREX) 1000 MG tablet   No current facility-administered medications for this encounter.    Harlene Hoots Ward, PA-C [facility] [phone]

## 2023-11-07 DIAGNOSIS — F332 Major depressive disorder, recurrent severe without psychotic features: Secondary | ICD-10-CM | POA: Diagnosis not present

## 2023-11-07 DIAGNOSIS — F411 Generalized anxiety disorder: Secondary | ICD-10-CM | POA: Diagnosis not present

## 2023-11-15 ENCOUNTER — Encounter (HOSPITAL_COMMUNITY)
Admission: RE | Disposition: A | Discharge status: Home / Self Care | Payer: Self-pay | Source: Home / Self Care | Attending: Urology

## 2023-11-15 ENCOUNTER — Ambulatory Visit (HOSPITAL_COMMUNITY): Payer: Self-pay | Admitting: Physician Assistant

## 2023-11-15 ENCOUNTER — Ambulatory Visit (HOSPITAL_COMMUNITY): Admitting: Anesthesiology

## 2023-11-15 ENCOUNTER — Encounter (HOSPITAL_COMMUNITY): Payer: Self-pay | Admitting: Urology

## 2023-11-15 ENCOUNTER — Other Ambulatory Visit: Payer: Self-pay

## 2023-11-15 ENCOUNTER — Ambulatory Visit (HOSPITAL_COMMUNITY)
Admission: RE | Admit: 2023-11-15 | Discharge: 2023-11-15 | Disposition: A | Discharge status: Home / Self Care | Attending: Urology | Admitting: Urology

## 2023-11-15 DIAGNOSIS — C61 Malignant neoplasm of prostate: Secondary | ICD-10-CM | POA: Diagnosis not present

## 2023-11-15 DIAGNOSIS — G4733 Obstructive sleep apnea (adult) (pediatric): Secondary | ICD-10-CM | POA: Diagnosis not present

## 2023-11-15 DIAGNOSIS — I4819 Other persistent atrial fibrillation: Secondary | ICD-10-CM

## 2023-11-15 DIAGNOSIS — R3912 Poor urinary stream: Secondary | ICD-10-CM | POA: Insufficient documentation

## 2023-11-15 DIAGNOSIS — Z7901 Long term (current) use of anticoagulants: Secondary | ICD-10-CM | POA: Insufficient documentation

## 2023-11-15 DIAGNOSIS — N529 Male erectile dysfunction, unspecified: Secondary | ICD-10-CM | POA: Diagnosis not present

## 2023-11-15 DIAGNOSIS — N138 Other obstructive and reflux uropathy: Secondary | ICD-10-CM

## 2023-11-15 DIAGNOSIS — N189 Chronic kidney disease, unspecified: Secondary | ICD-10-CM | POA: Insufficient documentation

## 2023-11-15 DIAGNOSIS — I10 Essential (primary) hypertension: Secondary | ICD-10-CM

## 2023-11-15 DIAGNOSIS — N401 Enlarged prostate with lower urinary tract symptoms: Secondary | ICD-10-CM | POA: Diagnosis not present

## 2023-11-15 DIAGNOSIS — R350 Frequency of micturition: Secondary | ICD-10-CM | POA: Insufficient documentation

## 2023-11-15 SURGERY — ABLATION, PROSTATE, TRANSURETHRAL, USING WATERJET
Anesthesia: General

## 2023-11-15 MED ORDER — OXYCODONE HCL 5 MG PO TABS
ORAL_TABLET | ORAL | Status: AC
  Filled 2023-11-15: qty 1

## 2023-11-15 MED ORDER — FENTANYL CITRATE (PF) 100 MCG/2ML IJ SOLN
INTRAMUSCULAR | Status: AC
  Filled 2023-11-15: qty 2

## 2023-11-15 MED ORDER — STERILE WATER FOR IRRIGATION IR SOLN
Status: DC | PRN
  Administered 2023-11-15: 10 mL

## 2023-11-15 MED ORDER — APIXABAN 5 MG PO TABS: 5.0000 mg | ORAL_TABLET | Freq: Two times a day (BID) | ORAL | Status: AC

## 2023-11-15 MED ORDER — OXYCODONE HCL 5 MG PO TABS
5.0000 mg | ORAL_TABLET | Freq: Once | ORAL | Status: AC | PRN
  Administered 2023-11-15: 5 mg via ORAL

## 2023-11-15 MED ORDER — LIDOCAINE HCL (PF) 2 % IJ SOLN
INTRAMUSCULAR | Status: DC | PRN
  Administered 2023-11-15: 100 mg via INTRADERMAL

## 2023-11-15 MED ORDER — PHENYLEPHRINE 80 MCG/ML (10ML) SYRINGE FOR IV PUSH (FOR BLOOD PRESSURE SUPPORT)
PREFILLED_SYRINGE | INTRAVENOUS | Status: AC
  Filled 2023-11-15: qty 10

## 2023-11-15 MED ORDER — ACETAMINOPHEN 10 MG/ML IV SOLN: 1000.0000 mg | Freq: Once | INTRAVENOUS | Status: DC | PRN

## 2023-11-15 MED ORDER — SODIUM CHLORIDE 0.9 % IV SOLN
2.0000 g | INTRAVENOUS | Status: AC
  Administered 2023-11-15: 2 g via INTRAVENOUS
  Filled 2023-11-15: qty 20

## 2023-11-15 MED ORDER — TRANEXAMIC ACID-NACL 1000-0.7 MG/100ML-% IV SOLN
1000.0000 mg | INTRAVENOUS | Status: AC
  Administered 2023-11-15: 1000 mg via INTRAVENOUS
  Filled 2023-11-15: qty 100

## 2023-11-15 MED ORDER — FENTANYL CITRATE (PF) 50 MCG/ML IJ SOSY
PREFILLED_SYRINGE | INTRAMUSCULAR | Status: AC
  Filled 2023-11-15: qty 1

## 2023-11-15 MED ORDER — ROCURONIUM BROMIDE 10 MG/ML (PF) SYRINGE
PREFILLED_SYRINGE | INTRAVENOUS | Status: DC | PRN
  Administered 2023-11-15: 50 mg via INTRAVENOUS
  Administered 2023-11-15: 10 mg via INTRAVENOUS

## 2023-11-15 MED ORDER — CHLORHEXIDINE GLUCONATE 0.12 % MT SOLN
15.0000 mL | Freq: Once | OROMUCOSAL | Status: AC
  Administered 2023-11-15: 15 mL via OROMUCOSAL

## 2023-11-15 MED ORDER — LIDOCAINE HCL (PF) 2 % IJ SOLN
INTRAMUSCULAR | Status: AC
  Filled 2023-11-15: qty 5

## 2023-11-15 MED ORDER — DEXAMETHASONE SOD PHOSPHATE PF 10 MG/ML IJ SOLN
INTRAMUSCULAR | Status: DC | PRN
  Administered 2023-11-15: 10 mg via INTRAVENOUS

## 2023-11-15 MED ORDER — APIXABAN 5 MG PO TABS: 5.0000 mg | ORAL_TABLET | Freq: Two times a day (BID) | ORAL | Status: DC

## 2023-11-15 MED ORDER — ONDANSETRON HCL 4 MG/2ML IJ SOLN
INTRAMUSCULAR | Status: DC | PRN
  Administered 2023-11-15: 4 mg via INTRAVENOUS

## 2023-11-15 MED ORDER — PROPOFOL 10 MG/ML IV BOLUS
INTRAVENOUS | Status: AC
  Filled 2023-11-15: qty 20

## 2023-11-15 MED ORDER — FENTANYL CITRATE (PF) 50 MCG/ML IJ SOSY
25.0000 ug | PREFILLED_SYRINGE | INTRAMUSCULAR | Status: DC | PRN
  Administered 2023-11-15: 50 ug via INTRAVENOUS

## 2023-11-15 MED ORDER — OXYCODONE HCL 5 MG/5ML PO SOLN: 5.0000 mg | Freq: Once | ORAL | Status: AC | PRN

## 2023-11-15 MED ORDER — LACTATED RINGERS IV SOLN: INTRAVENOUS | Status: DC

## 2023-11-15 MED ORDER — SUGAMMADEX SODIUM 200 MG/2ML IV SOLN
INTRAVENOUS | Status: AC
  Filled 2023-11-15: qty 2

## 2023-11-15 MED ORDER — NITROFURANTOIN MONOHYD MACRO 100 MG PO CAPS: 100.0000 mg | ORAL_CAPSULE | Freq: Every day | ORAL | 0 refills | Status: AC

## 2023-11-15 MED ORDER — 0.9 % SODIUM CHLORIDE (POUR BTL) OPTIME
TOPICAL | Status: DC | PRN
  Administered 2023-11-15: 1000 mL

## 2023-11-15 MED ORDER — ORAL CARE MOUTH RINSE: 15.0000 mL | Freq: Once | OROMUCOSAL | Status: AC

## 2023-11-15 MED ORDER — SODIUM CHLORIDE 0.9 % IR SOLN
Status: DC | PRN
  Administered 2023-11-15: 12000 mL

## 2023-11-15 MED ORDER — FENTANYL CITRATE (PF) 100 MCG/2ML IJ SOLN
INTRAMUSCULAR | Status: DC | PRN
  Administered 2023-11-15: 100 ug via INTRAVENOUS

## 2023-11-15 MED ORDER — PROPOFOL 10 MG/ML IV BOLUS
INTRAVENOUS | Status: DC | PRN
  Administered 2023-11-15: 150 mg via INTRAVENOUS

## 2023-11-15 MED ORDER — ONDANSETRON HCL 4 MG/2ML IJ SOLN
INTRAMUSCULAR | Status: AC
  Filled 2023-11-15: qty 2

## 2023-11-15 MED ORDER — DROPERIDOL 2.5 MG/ML IJ SOLN: 0.6250 mg | Freq: Once | INTRAMUSCULAR | Status: DC | PRN

## 2023-11-15 MED ORDER — ROCURONIUM BROMIDE 10 MG/ML (PF) SYRINGE
PREFILLED_SYRINGE | INTRAVENOUS | Status: AC
  Filled 2023-11-15: qty 10

## 2023-11-15 MED ORDER — SUGAMMADEX SODIUM 200 MG/2ML IV SOLN
INTRAVENOUS | Status: DC | PRN
  Administered 2023-11-15: 200 mg via INTRAVENOUS

## 2023-11-15 SURGICAL SUPPLY — 27 items
BAG URINE DRAIN 2000ML AR STRL (UROLOGICAL SUPPLIES) ×1 IMPLANT
BAND RUBBER #18 3X1/16 STRL (MISCELLANEOUS) IMPLANT
CATH HEMA 3WAY 30CC 22FR COUDE (CATHETERS) IMPLANT
CATH HEMA 3WAY 30CC 24FR COUDE (CATHETERS) IMPLANT
CATH HEMATURIA 20FR (CATHETERS) IMPLANT
COVER MAYO STAND STRL (DRAPES) ×1 IMPLANT
DRAPE FOOT SWITCH (DRAPES) ×1 IMPLANT
DRAPE SURG IRRIG POUCH 19X23 (DRAPES) IMPLANT
GEL ULTRASOUND 8.5O AQUASONIC (MISCELLANEOUS) ×1 IMPLANT
GLOVE SURG LX STRL 7.5 STRW (GLOVE) ×1 IMPLANT
GOWN STRL REUS W/ TWL XL LVL3 (GOWN DISPOSABLE) ×1 IMPLANT
HANDPIECE AQUABEAM (MISCELLANEOUS) ×1 IMPLANT
HOLDER FOLEY CATH W/STRAP (MISCELLANEOUS) IMPLANT
KIT TURNOVER KIT A (KITS) ×1 IMPLANT
LOOP CUT BIPOLAR 24F LRG (ELECTROSURGICAL) IMPLANT
MANIFOLD NEPTUNE II (INSTRUMENTS) ×1 IMPLANT
MAT ABSORB FLUID 56X50 GRAY (MISCELLANEOUS) ×2 IMPLANT
PACK CYSTO (CUSTOM PROCEDURE TRAY) ×1 IMPLANT
PACK DRAPE AQUABEAM (MISCELLANEOUS) ×1 IMPLANT
PAD PREP 24X48 CUFFED NSTRL (MISCELLANEOUS) ×1 IMPLANT
PIN SAFETY STERILE (MISCELLANEOUS) IMPLANT
SYR 30ML LL (SYRINGE) ×1 IMPLANT
SYRINGE TOOMEY IRRIG 70ML (MISCELLANEOUS) ×2 IMPLANT
TOWEL OR 17X26 10 PK STRL BLUE (TOWEL DISPOSABLE) ×1 IMPLANT
TUBING CONNECTING 10 (TUBING) ×2 IMPLANT
TUBING UROLOGY SET (TUBING) ×1 IMPLANT
UNDERPAD 30X36 HEAVY ABSORB (UNDERPADS AND DIAPERS) ×1 IMPLANT

## 2023-11-15 NOTE — Op Note (Signed)
 Preoperative diagnosis: BPH with lower urinary tract symptoms, weak stream, frequency  Postoperative diagnosis: Same   Procedure: Robotic water  jet ablation of the prostate   Surgeon: Nieves   Anesthesia: General   Indication for procedure: 78 year old male with symptomatic BPH.  Findings:  On exam the penis was circumcised without mass or lesion.  The glans and meatus appeared normal.  The prostate was about 40 g on exam and smooth without hard area or nodule.  Cystoscopy revealed obstructing lateral lobes.  4 UroLift urethral end pieces were encountered and removed.  The bladder contained no stone or foreign body.  No mucosal lesions were visualized, the trigone and ureteral orifices appeared normal pre and post ablation and a resection.    Description of procedure:  He was brought to the operating room and placed supine on the operating table.  After adequate anesthesia he was placed lithotomy position. Timeout was performed to confirm the patient and procedure. The TRUS Stepper was mounted to the Articulating Arm and secured to OR bed. The ultrasound probe was attached to the stepper. Exam under anesthesia was performed and the TRUS was inserted per rectum.  There was no resistance. The ultrasound probe was aligned, and confirmation made that the prostate is centered and aligned using both transverse and sagittal views. The bladder neck, verumontanum and the central/transition zones were identified.  Genitalia were prepped and draped in the usual sterile fashion. The 43F AQUABEAM Handpiece is inserted into the prostatic urethra and a complete cystoscopic evaluation was performed by inspecting the prostate, bladder, and identifying the location of the verumontanum/external sphincter. The AQUABEAM Handpiece was secured to the Handpiece Articulating Arm. Confirmed alignment of AQUABEAM Handpiece and TRUS Probe to be parallel and colinear. Confirmation that AQUABEAM nozzle is centered and anterior  of the bladder neck or the median lobe. The cystoscope was then retracted to visualize the verumontanum and external sphincter and the cystoscope tip was positioned just proximal to the external sphincter. Reconfirmed alignment of the TRUS probe with the AQUABEAM Handpiece and compression applied with TRUS probe. Horizontal alignment of the Handpiece waterjet nozzle was performed. The Aquablation treatment zones were planned utilizing real-time TRUS to visualize the contour of the prostate and the depth and radial angles of resection were defined in the transverse view. In the sagittal view, the AQUABEAM nozzle is identified and position registered with software. The treatment contours were then adjusted to conform to the intended resection margins. The median lobe, bladder neck and verumontanum were marked and confirmed in the treatment contour. The Aquablation Treatment was then started following the resection contour confirmed under ultrasound guidance.  TOTAL AQUABLATION RESECTION TIME: First pass 3 minutes 33 seconds, second pass 3 minutes 22 seconds  Once Aquablation resection was complete the 20 French aqua beam handpiece was carefully removed.  The continuous-flow sheath with the visual obturator was passed and then the loop and handle.  The trigone and the ureteral orifices were identified.  The bladder neck was identified at 6:00 and this was taken up to 12:00 with fulguration of the bladder neck and prostate for hemostasis.  Slight amount of anterior tissue was resected.  Similarly from 6:00 up to 12:00 on the left side of the bladder neck was identified by resecting some of the ablated tissue to identify the bladder neck and cauterize any bleeding.  Some anterior tissue on the left was resected.  Looking from the apex some anterior tissue dropped down and this was resected.  No resection distal to  the verumontanum.  This created excellent hemostasis at low pressure.  All the chips were evacuated.   Ureteral orifices again identified and noted to be normal without injury.  The scope was backed out and a 20 French hematuria catheter was placed with 30 cc in the balloon.  The balloon was seated at the bladder neck and it was irrigated on light traction and noted to be clear to pink.  He was hooked up to CBI.  He was cleaned up and placed supine.  Catheter was placed on traction.  He was awakened and taken to the cover room in stable condition.  Complications: None  Blood loss: 50 mL  Specimens: None  Drains: 20 French three-way hematuria catheter with 30 cc in the balloon  Disposition: Patient stable to PACU-discussed the procedure, postop care and follow-up with Mrs. Rik.

## 2023-11-15 NOTE — Anesthesia Postprocedure Evaluation (Signed)
 Anesthesia Post Note  Patient: Wesley [name]  Procedure(s) Performed: ABLATION, PROSTATE, TRANSURETHRAL, USING WATERJET     Patient location during evaluation: PACU Anesthesia Type: General Level of consciousness: awake and alert Pain management: pain level controlled Vital Signs Assessment: post-procedure vital signs reviewed and stable Respiratory status: spontaneous breathing, nonlabored ventilation, respiratory function stable and patient connected to nasal cannula oxygen Cardiovascular status: blood pressure returned to baseline and stable Postop Assessment: no apparent nausea or vomiting Anesthetic complications: no   No notable events documented.  Last Vitals:  Vitals:   11/15/23 1037 11/15/23 1045  BP: (!) 165/96   Pulse: 69   Resp: 12   Temp:    SpO2: 92% 96%    Last Pain:  Vitals:   11/15/23 1037  TempSrc:   PainSc: 3                  Thom JONELLE Peoples

## 2023-11-15 NOTE — Transfer of Care (Signed)
 Immediate Anesthesia Transfer of Care Note  Patient: Wesley [name]  Procedure(s) Performed: ABLATION, PROSTATE, TRANSURETHRAL, USING WATERJET  Patient Location: PACU  Anesthesia Type:General  Level of Consciousness: sedated  Airway & Oxygen Therapy: Patient Spontanous Breathing and Patient connected to face mask oxygen  Post-op Assessment: Report given to RN and Post -op Vital signs reviewed and stable  Post vital signs: Reviewed and stable  Last Vitals:  Vitals Value Taken Time  BP    Temp    Pulse 73 11/15/23 09:04  Resp 15 11/15/23 09:04  SpO2 94 % 11/15/23 09:04  Vitals shown include unfiled device data.  Last Pain:  Vitals:   11/15/23 0555  TempSrc: Oral         Complications: No notable events documented.

## 2023-11-15 NOTE — Interval H&P Note (Signed)
 History and Physical Interval Note:  11/15/2023 7:23 AM  Wesley [name]  has presented today for surgery, with the diagnosis of BENIGN PROSTATIC HYPREPLASIA.  The various methods of treatment have been discussed with the patient and family. After consideration of risks, benefits and other options for treatment, the patient has consented to  Procedure(s): ABLATION, PROSTATE, TRANSURETHRAL, USING WATERJET (N/A) as a surgical intervention.  The patient's history has been reviewed, patient examined, no change in status, stable for surgery.  I have reviewed the patient's chart and labs.  Questions were answered to the patient's satisfaction.  He is well. No dysuria or gross hematuria. No fever. Discussed flow vs storage symptoms. Will attempt to preserve.    Donnice Brooks

## 2023-11-15 NOTE — Anesthesia Procedure Notes (Signed)
 Procedure Name: Intubation Date/Time: 11/15/2023 7:39 AM  Performed by: Carleton Garnette SAUNDERS, CRNAPre-anesthesia Checklist: Patient identified, Emergency Drugs available, Suction available, Patient being monitored and Timeout performed Patient Re-evaluated:Patient Re-evaluated prior to induction Oxygen Delivery Method: Circle system utilized Preoxygenation: Pre-oxygenation with 100% oxygen Induction Type: IV induction Ventilation: Mask ventilation without difficulty and Oral airway inserted - appropriate to patient size Laryngoscope Size: Mac, 4 and Glidescope (Hx grade 3 view chose to use glidescope) Grade View: Grade I Tube type: Oral Tube size: 7.5 mm Number of attempts: 1 Airway Equipment and Method: Stylet Placement Confirmation: ETT inserted through vocal cords under direct vision, positive ETCO2 and breath sounds checked- equal and bilateral Secured at: 23 cm Tube secured with: Tape Dental Injury: Teeth and Oropharynx as per pre-operative assessment

## 2023-11-15 NOTE — Discharge Instructions (Addendum)
 Robotic Water  Jet Ablation of the Prostate, Care After The following information offers guidance on how to care for yourself after your procedure. Your health care provider may also give you more specific instructions. If you have problems or questions, contact your health care provider. What can I expect after the procedure? After the procedure, it is common to have: Mild pain in your lower abdomen. Soreness or mild discomfort in your penis or when you urinate. This is from having the catheter inserted during the procedure. A sudden urge to urinate (urgency). A need to urinate often. A small amount of blood in your urine. You may notice some small blood clots in your urine. These are normal. Follow these instructions at home: Medicines Take over-the-counter and prescription medicines only as told by your health care provider. If you were prescribed an antibiotic medicine, take it as told by your health care provider. Do not stop taking the antibiotic even if you start to feel better. Activity  Rest as told by your health care provider. Avoid sitting for a long time without moving. Get up to take short walks every 1-2 hours. This is important to improve blood flow and breathing. Ask for help if you feel weak or unsteady. You may increase your physical activity gradually as you start to feel better. Do not drive or operate machinery until your health care provider says that it is safe. Do not ride in a car for long periods of time, or as told by your health care provider. Avoid intense physical activity for as long as told by your health care provider. Do not lift anything that is heavier than 10 lb (4.5 kg), or the limit that you are told, until your health care provider says that it is safe. Do not have sex until your health care provider approves. Return to your normal activities as told by your health care provider. Ask your health care provider what activities are safe for you. Preventing  constipation You may need to take these actions to prevent or treat constipation: Drink enough fluid to keep your urine pale yellow. Take over-the-counter or prescription medicines. Eat foods that are high in fiber, such as beans, whole grains, and fresh fruits and vegetables. Limit foods that are high in fat and processed sugars, such as fried or sweet foods.  General instructions Do not strain when you have a bowel movement. Straining may lead to bleeding from the prostate. This may cause blood clots and trouble urinating. Do not use any products that contain nicotine or tobacco. These products include cigarettes, chewing tobacco, and vaping devices, such as e-cigarettes. If you need help quitting, ask your health care provider. If you go home with a tube draining your urine (urinary catheter), care for the catheter as told by your health care provider. Wear compression stockings as told by your health care provider. These stockings help to prevent blood clots and reduce swelling in your legs. Keep all follow-up visits. This is important. Contact a health care provider if: You have signs of infection, such as: Fever or chills. Urine that smells very bad. Swelling around your urethra that is getting worse. Swelling in your penis or testicles. You have difficulty urinating. You have pain that gets worse or does not improve with medicine. You have blood in your urine that does not go away after 1 week of resting and drinking more fluids. You have trouble having a bowel movement. You have trouble having or keeping an erection. No semen comes  out during orgasm (dry ejaculation). You have a urinary catheter in place, and you have: Spasms or pain. Problems with your catheter or your catheter is blocked. Get help right away if: You are unable to urinate. You are having more blood clots in your urine instead of fewer. You have: Large blood clots. A lot of blood in your urine. Pain in your  back or lower abdomen. You have difficulty breathing or shortness of breath. You develop swelling or pain in your leg. These symptoms may be an emergency. Get help right away. Call 911. Do not wait to see if the symptoms will go away. Do not drive yourself to the hospital. Summary After the procedure, it is common to have a small amount of blood in your urine. Follow restrictions about lifting and sexual activity as told by your health care provider. Ask what activities are safe for you. Keep all follow-up visits. This is important. This information is not intended to replace advice given to you by your health care provider. Make sure you discuss any questions you have with your health care provider. Document Revised: 09/13/2020 Document Reviewed: 09/13/2020 [employer] Patient Education  2025 [employer].

## 2023-11-22 DIAGNOSIS — N401 Enlarged prostate with lower urinary tract symptoms: Secondary | ICD-10-CM | POA: Diagnosis not present

## 2023-11-22 DIAGNOSIS — R3914 Feeling of incomplete bladder emptying: Secondary | ICD-10-CM | POA: Diagnosis not present

## 2024-03-10 ENCOUNTER — Ambulatory Visit: Admitting: Physical Therapy
